# Patient Record
Sex: Male | Born: 1980
Health system: Southern US, Community
[De-identification: ages and names within clinical notes are randomized; demographics above are authoritative.]

## PROBLEM LIST (undated history)

## (undated) DIAGNOSIS — R569 Unspecified convulsions: Secondary | ICD-10-CM

## (undated) DIAGNOSIS — G43909 Migraine, unspecified, not intractable, without status migrainosus: Secondary | ICD-10-CM

## (undated) DIAGNOSIS — C8475 Anaplastic large cell lymphoma, ALK-negative, lymph nodes of inguinal region and lower limb: Secondary | ICD-10-CM

## (undated) MED FILL — Dexamethasone Sodium Phosphate Inj 100 MG/10ML: INTRAMUSCULAR | Qty: 1 | Status: AC

## (undated) MED FILL — Fosaprepitant Dimeglumine For IV Infusion 150 MG (Base Eq): INTRAVENOUS | Qty: 5 | Status: AC

---

## 2004-10-28 ENCOUNTER — Emergency Department (HOSPITAL_COMMUNITY): Admission: EM | Admit: 2004-10-28 | Discharge: 2004-10-28 | Payer: Self-pay | Admitting: Emergency Medicine

## 2005-07-28 ENCOUNTER — Emergency Department (HOSPITAL_COMMUNITY): Admission: EM | Admit: 2005-07-28 | Discharge: 2005-07-28 | Payer: Self-pay | Admitting: Emergency Medicine

## 2013-07-01 ENCOUNTER — Encounter (HOSPITAL_COMMUNITY): Payer: Self-pay | Admitting: Emergency Medicine

## 2013-07-01 ENCOUNTER — Emergency Department (HOSPITAL_COMMUNITY)
Admission: EM | Admit: 2013-07-01 | Discharge: 2013-07-01 | Disposition: A | Discharge status: Home / Self Care | Payer: Self-pay | Attending: Emergency Medicine | Admitting: Emergency Medicine

## 2013-07-01 DIAGNOSIS — L0291 Cutaneous abscess, unspecified: Secondary | ICD-10-CM

## 2013-07-01 DIAGNOSIS — L03317 Cellulitis of buttock: Secondary | ICD-10-CM | POA: Insufficient documentation

## 2013-07-01 DIAGNOSIS — L0231 Cutaneous abscess of buttock: Secondary | ICD-10-CM | POA: Insufficient documentation

## 2013-07-01 MED ORDER — SULFAMETHOXAZOLE-TRIMETHOPRIM 800-160 MG PO TABS: 1.0000 | ORAL_TABLET | Freq: Two times a day (BID) | ORAL | Status: DC

## 2013-07-01 MED ORDER — HYDROCODONE-ACETAMINOPHEN 5-325 MG PO TABS: 1.0000 | ORAL_TABLET | Freq: Four times a day (QID) | ORAL | Status: DC | PRN

## 2013-07-01 NOTE — ED Provider Notes (Signed)
History  This chart was scribed for Roxy Horseman - PA by Manuela Schwartz, ED scribe. This patient was seen in room TR11C/TR11C and the patient's care was started at 1838.  CSN: [id number] Arrival date & time 07/01/13  1819  First MD Initiated Contact with Patient 07/01/13 1838     Chief Complaint  Patient presents with  . Rash   The history is provided by the patient. No language interpreter was used.   HPI Comments: Randy [name] is a 32 y.o. male who presents to the Emergency Department complaining of constant, gradually worsening, painful abscess to his right buttocks, onset 4 days ago. He states tried using warm compress and pressure to drain it himself but denies any drainage from abscess. He denies fever/chills. He has not taken any medicines for this problem nor has he began any new medicines. He denise fever/chills. He denies any IV drug usage. He states no hx of prior abscess or boils.   He also c/o small localized itchy rash to his right wrist/forearm which has been constantly and gradually worsening over the past month. He states has not taken any medicines for this problem either and has not yet seen anyone for this problem.  History reviewed. No pertinent past medical history. History reviewed. No pertinent past surgical history. No family history on file. History  Substance Use Topics  . Smoking status: Not on file  . Smokeless tobacco: Not on file  . Alcohol Use: Not on file    Review of Systems  Constitutional: Negative for fever and chills.  HENT: Negative for congestion and rhinorrhea.   Respiratory: Negative for cough and shortness of breath.   Cardiovascular: Negative for chest pain.  Gastrointestinal: Negative for nausea, vomiting and abdominal pain.  Skin: Positive for rash (itchy rash over his right wrist).       Abscess over his right buttocks which is painful  Neurological: Negative for weakness.  All other systems reviewed and are negative.   A  complete 10 system review of systems was obtained and all systems are negative except as noted in the HPI and PMH.   Allergies  Review of patient's allergies indicates no known allergies.  Home Medications  No current outpatient prescriptions on file. Triage Vitals: BP 132/84  Pulse 94  Temp(Src) 98.5 F (36.9 C) (Oral)  Resp 16  SpO2 96% Physical Exam  Nursing note and vitals reviewed. Constitutional: He is oriented to person, place, and time. He appears well-developed and well-nourished.  HENT:  Head: Normocephalic and atraumatic.  Eyes: Conjunctivae and EOM are normal.  Neck: Normal range of motion.  Cardiovascular: Normal rate.   Pulmonary/Chest: Effort normal.  Abdominal: He exhibits no distension.  Musculoskeletal: Normal range of motion.  Neurological: He is alert and oriented to person, place, and time.  Skin: Skin is dry.     2 abscesses to the buttocks, with moderate induration and fluctuance as diagrammed  Psychiatric: He has a normal mood and affect. His behavior is normal. Judgment and thought content normal.    ED Course  Procedures (including critical care time) DIAGNOSTIC STUDIES: Oxygen Saturation is 96% on room air, normal by my interpretation.    COORDINATION OF CARE: At 720 PM Discussed treatment plan with patient which includes incision and drainage of abscesses to right buttocks. Patient agrees.   INCISION AND DRAINAGE Performed by: Roxy Horseman Consent: Verbal consent obtained. Risks and benefits: risks, benefits and alternatives were discussed Type: abscess  Body area: Buttock  Anesthesia:  local infiltration  Incision was made with a scalpel.  Local anesthetic: lidocaine 2 % with epinephrine  Anesthetic total: 5 ml  Complexity: complex Blunt dissection to break up loculations  Drainage: purulent  Drainage amount: Moderate   Packing material: Not packed   Patient tolerance: Patient tolerated the procedure well with no  immediate complications.     Labs Reviewed - No data to display No results found. 1. Abscess     MDM  Patient with abscesses. Abscesses have been drained. Will order wound culture. Will give antibiotics, pain medicine, and recommend followup with primary care. Patient is stable and ready for discharge.  I personally performed the services described in this documentation, which was scribed in my presence. The recorded information has been reviewed and is accurate.     Roxy Horseman, PA-C 07/01/13 1942

## 2013-07-01 NOTE — ED Notes (Signed)
Pt reports boils on buttox that started as rash on Thursday. They were itching at first and now very painful.

## 2013-07-02 NOTE — ED Provider Notes (Signed)
Medical screening examination/treatment/procedure(s) were performed by non-physician practitioner and as supervising physician I was immediately available for consultation/collaboration.  Acadia Thammavong, MD 07/02/13 0153 

## 2013-07-05 ENCOUNTER — Telehealth (HOSPITAL_COMMUNITY): Payer: Self-pay | Admitting: Emergency Medicine

## 2013-07-05 NOTE — ED Notes (Signed)
Post ED Visit - Positive Culture Follow-up  Culture report reviewed by antimicrobial stewardship pharmacist: [x]  Wes Dulaney, Pharm.D., BCPS []  Celedonio Miyamoto, Pharm.D., BCPS []  Georgina Pillion, Pharm.D., BCPS []  [place], [address].D., BCPS, AAHIVP []  Estella Husk, Pharm.D., BCPS, AAHIVP  Positive wound culture Treated with Sulfa-Trimeth, organism sensitive to the same and no further patient follow-up is required at this time.  Kylie A Holland 07/05/2013, 6:03 PM

## 2014-01-05 ENCOUNTER — Encounter (HOSPITAL_COMMUNITY): Payer: Self-pay | Admitting: Emergency Medicine

## 2014-01-05 ENCOUNTER — Inpatient Hospital Stay (HOSPITAL_COMMUNITY)
Admission: EM | Admit: 2014-01-05 | Discharge: 2014-01-08 | DRG: 076 | Disposition: A | Discharge status: Home / Self Care | Payer: Self-pay | Attending: Internal Medicine | Admitting: Internal Medicine

## 2014-01-05 DIAGNOSIS — A879 Viral meningitis, unspecified: Principal | ICD-10-CM | POA: Diagnosis present

## 2014-01-05 DIAGNOSIS — F17200 Nicotine dependence, unspecified, uncomplicated: Secondary | ICD-10-CM | POA: Diagnosis present

## 2014-01-05 DIAGNOSIS — A539 Syphilis, unspecified: Secondary | ICD-10-CM | POA: Diagnosis present

## 2014-01-05 DIAGNOSIS — B003 Herpesviral meningitis: Secondary | ICD-10-CM

## 2014-01-05 DIAGNOSIS — A6002 Herpesviral infection of other male genital organs: Secondary | ICD-10-CM

## 2014-01-05 DIAGNOSIS — E876 Hypokalemia: Secondary | ICD-10-CM | POA: Diagnosis present

## 2014-01-05 DIAGNOSIS — G039 Meningitis, unspecified: Secondary | ICD-10-CM | POA: Diagnosis present

## 2014-01-05 DIAGNOSIS — A530 Latent syphilis, unspecified as early or late: Secondary | ICD-10-CM

## 2014-01-05 DIAGNOSIS — R21 Rash and other nonspecific skin eruption: Secondary | ICD-10-CM | POA: Diagnosis present

## 2014-01-05 HISTORY — DX: Unspecified convulsions: R56.9

## 2014-01-05 HISTORY — DX: Migraine, unspecified, not intractable, without status migrainosus: G43.909

## 2014-01-05 LAB — CBC WITH DIFFERENTIAL/PLATELET
BASOS ABS: 0.1 10*3/uL (ref 0.0–0.1)
BASOS PCT: 0 % (ref 0–1)
EOS ABS: 0.3 10*3/uL (ref 0.0–0.7)
Eosinophils Relative: 2 % (ref 0–5)
HCT: 45.1 % (ref 39.0–52.0)
Hemoglobin: 16.2 g/dL (ref 13.0–17.0)
Lymphocytes Relative: 16 % (ref 12–46)
Lymphs Abs: 2.2 10*3/uL (ref 0.7–4.0)
MCH: 30.7 pg (ref 26.0–34.0)
MCHC: 35.9 g/dL (ref 30.0–36.0)
MCV: 85.4 fL (ref 78.0–100.0)
MONO ABS: 0.9 10*3/uL (ref 0.1–1.0)
Monocytes Relative: 6 % (ref 3–12)
NEUTROS ABS: 10.3 10*3/uL — AB (ref 1.7–7.7)
NEUTROS PCT: 75 % (ref 43–77)
Platelets: 199 10*3/uL (ref 150–400)
RBC: 5.28 MIL/uL (ref 4.22–5.81)
RDW: 16.3 % — AB (ref 11.5–15.5)
WBC: 13.7 10*3/uL — ABNORMAL HIGH (ref 4.0–10.5)

## 2014-01-05 LAB — BASIC METABOLIC PANEL
BUN: 8 mg/dL (ref 6–23)
CHLORIDE: 100 meq/L (ref 96–112)
CO2: 23 mEq/L (ref 19–32)
CREATININE: 1.33 mg/dL (ref 0.50–1.35)
Calcium: 9.6 mg/dL (ref 8.4–10.5)
GFR, EST AFRICAN AMERICAN: 82 mL/min — AB (ref >90)
GFR, EST NON AFRICAN AMERICAN: 71 mL/min — AB (ref >90)
Glucose, Bld: 102 mg/dL — ABNORMAL HIGH (ref 70–99)
POTASSIUM: 3.5 meq/L — AB (ref 3.7–5.3)
Sodium: 139 mEq/L (ref 137–147)

## 2014-01-05 NOTE — ED Notes (Signed)
Presents with headache on right side of head and behind right eye began this am associated with light and sound sensitivity and nausea,  Also c/o back and neck pain began the same time as the headache.  Rash inbetween fingers on hands and on legs, and lump to on left groin. Reports chills. Afebrile at triage.  Alert, oriented. Headache gradually became worse.

## 2014-01-06 ENCOUNTER — Inpatient Hospital Stay (HOSPITAL_COMMUNITY): Payer: Self-pay

## 2014-01-06 ENCOUNTER — Emergency Department (HOSPITAL_COMMUNITY): Payer: Self-pay

## 2014-01-06 DIAGNOSIS — R291 Meningismus: Secondary | ICD-10-CM

## 2014-01-06 DIAGNOSIS — R839 Unspecified abnormal finding in cerebrospinal fluid: Secondary | ICD-10-CM

## 2014-01-06 DIAGNOSIS — R51 Headache: Secondary | ICD-10-CM

## 2014-01-06 DIAGNOSIS — R112 Nausea with vomiting, unspecified: Secondary | ICD-10-CM

## 2014-01-06 DIAGNOSIS — R21 Rash and other nonspecific skin eruption: Secondary | ICD-10-CM

## 2014-01-06 DIAGNOSIS — G039 Meningitis, unspecified: Secondary | ICD-10-CM

## 2014-01-06 DIAGNOSIS — B009 Herpesviral infection, unspecified: Secondary | ICD-10-CM

## 2014-01-06 LAB — CSF CELL COUNT WITH DIFFERENTIAL
LYMPHS CSF: 86 % — AB (ref 40–80)
Lymphs, CSF: 88 % — ABNORMAL HIGH (ref 40–80)
MONOCYTE-MACROPHAGE-SPINAL FLUID: 9 % — AB (ref 15–45)
Monocyte-Macrophage-Spinal Fluid: 13 % — ABNORMAL LOW (ref 15–45)
RBC Count, CSF: 8 /mm3 — ABNORMAL HIGH
RBC Count, CSF: 9 /mm3 — ABNORMAL HIGH
SEGMENTED NEUTROPHILS-CSF: 1 % (ref 0–6)
Segmented Neutrophils-CSF: 3 % (ref 0–6)
TUBE #: 1
TUBE #: 4
WBC CSF: 439 /mm3 — AB (ref 0–5)
WBC, CSF: 322 /mm3 (ref 0–5)

## 2014-01-06 LAB — HIV ANTIBODY (ROUTINE TESTING W REFLEX): HIV: NONREACTIVE

## 2014-01-06 LAB — RPR: RPR Ser Ql: REACTIVE — AB

## 2014-01-06 LAB — GRAM STAIN

## 2014-01-06 LAB — HERPES SIMPLEX VIRUS(HSV) DNA BY PCR
HSV 1 DNA: NOT DETECTED
HSV 2 DNA: DETECTED

## 2014-01-06 LAB — CRYPTOCOCCAL ANTIGEN, CSF: Crypto Ag: NEGATIVE

## 2014-01-06 LAB — PROTEIN, CSF: TOTAL PROTEIN, CSF: 132 mg/dL — AB (ref 15–45)

## 2014-01-06 LAB — RPR TITER: RPR Titer: 1:4 {titer} — AB

## 2014-01-06 LAB — GLUCOSE, CSF: Glucose, CSF: 57 mg/dL (ref 43–76)

## 2014-01-06 MED ORDER — ONDANSETRON HCL 4 MG/2ML IJ SOLN
4.0000 mg | Freq: Once | INTRAMUSCULAR | Status: AC
  Administered 2014-01-06: 4 mg via INTRAVENOUS
  Filled 2014-01-06: qty 2

## 2014-01-06 MED ORDER — DEXTROSE 5 % IV SOLN: 2.0000 g | INTRAVENOUS | Status: DC

## 2014-01-06 MED ORDER — DEXTROSE 5 % IV SOLN
2.0000 g | Freq: Once | INTRAVENOUS | Status: AC
  Administered 2014-01-06: 2 g via INTRAVENOUS
  Filled 2014-01-06: qty 2

## 2014-01-06 MED ORDER — ACETAMINOPHEN 650 MG RE SUPP: 650.0000 mg | Freq: Four times a day (QID) | RECTAL | Status: DC | PRN

## 2014-01-06 MED ORDER — ENOXAPARIN SODIUM 40 MG/0.4ML ~~LOC~~ SOLN
40.0000 mg | SUBCUTANEOUS | Status: DC
  Administered 2014-01-06 – 2014-01-07 (×2): 40 mg via SUBCUTANEOUS
  Filled 2014-01-06 (×3): qty 0.4

## 2014-01-06 MED ORDER — SODIUM CHLORIDE 0.9 % IV SOLN
2.0000 g | INTRAVENOUS | Status: DC
  Administered 2014-01-06: 2 g via INTRAVENOUS
  Filled 2014-01-06 (×3): qty 2000

## 2014-01-06 MED ORDER — DEXTROSE 5 % IV SOLN
2.0000 g | Freq: Two times a day (BID) | INTRAVENOUS | Status: DC
  Administered 2014-01-06 – 2014-01-07 (×2): 2 g via INTRAVENOUS
  Filled 2014-01-06 (×3): qty 2

## 2014-01-06 MED ORDER — HYDROMORPHONE HCL PF 1 MG/ML IJ SOLN
1.0000 mg | Freq: Once | INTRAMUSCULAR | Status: AC
  Administered 2014-01-06: 1 mg via INTRAVENOUS
  Filled 2014-01-06: qty 1

## 2014-01-06 MED ORDER — DEXTROSE 5 % IV SOLN
800.0000 mg | Freq: Three times a day (TID) | INTRAVENOUS | Status: DC
  Administered 2014-01-06 – 2014-01-07 (×3): 800 mg via INTRAVENOUS
  Filled 2014-01-06 (×5): qty 16

## 2014-01-06 MED ORDER — MAGNESIUM SULFATE 40 MG/ML IJ SOLN
2.0000 g | Freq: Once | INTRAMUSCULAR | Status: AC
  Administered 2014-01-06: 2 g via INTRAVENOUS
  Filled 2014-01-06: qty 50

## 2014-01-06 MED ORDER — HYDROCODONE-ACETAMINOPHEN 5-325 MG PO TABS
1.0000 | ORAL_TABLET | ORAL | Status: DC | PRN
  Administered 2014-01-06 – 2014-01-08 (×6): 2 via ORAL
  Filled 2014-01-06 (×6): qty 2

## 2014-01-06 MED ORDER — METOCLOPRAMIDE HCL 5 MG/ML IJ SOLN
10.0000 mg | Freq: Once | INTRAMUSCULAR | Status: AC
  Administered 2014-01-06: 10 mg via INTRAVENOUS
  Filled 2014-01-06: qty 2

## 2014-01-06 MED ORDER — ONDANSETRON HCL 4 MG/2ML IJ SOLN: 4.0000 mg | Freq: Four times a day (QID) | INTRAMUSCULAR | Status: DC | PRN

## 2014-01-06 MED ORDER — VANCOMYCIN HCL IN DEXTROSE 1-5 GM/200ML-% IV SOLN
1000.0000 mg | Freq: Three times a day (TID) | INTRAVENOUS | Status: DC
  Administered 2014-01-06 – 2014-01-07 (×2): 1000 mg via INTRAVENOUS
  Filled 2014-01-06 (×4): qty 200

## 2014-01-06 MED ORDER — ACETAMINOPHEN 325 MG PO TABS
650.0000 mg | ORAL_TABLET | Freq: Four times a day (QID) | ORAL | Status: DC | PRN
  Administered 2014-01-07: 650 mg via ORAL
  Filled 2014-01-06: qty 2

## 2014-01-06 MED ORDER — SODIUM CHLORIDE 0.9 % IV SOLN
INTRAVENOUS | Status: DC
  Administered 2014-01-06 – 2014-01-07 (×2): via INTRAVENOUS

## 2014-01-06 MED ORDER — SODIUM CHLORIDE 0.9 % IV BOLUS (SEPSIS)
1000.0000 mL | Freq: Once | INTRAVENOUS | Status: AC
  Administered 2014-01-06: 1000 mL via INTRAVENOUS

## 2014-01-06 MED ORDER — DEXAMETHASONE SODIUM PHOSPHATE 4 MG/ML IJ SOLN
10.0000 mg | Freq: Once | INTRAMUSCULAR | Status: AC
  Administered 2014-01-06: 10 mg via INTRAVENOUS
  Filled 2014-01-06: qty 3

## 2014-01-06 MED ORDER — VANCOMYCIN HCL IN DEXTROSE 1-5 GM/200ML-% IV SOLN
1000.0000 mg | Freq: Once | INTRAVENOUS | Status: AC
  Administered 2014-01-06: 1000 mg via INTRAVENOUS
  Filled 2014-01-06: qty 200

## 2014-01-06 MED ORDER — DIPHENHYDRAMINE HCL 50 MG/ML IJ SOLN
25.0000 mg | Freq: Once | INTRAMUSCULAR | Status: AC
  Administered 2014-01-06: 25 mg via INTRAVENOUS
  Filled 2014-01-06: qty 1

## 2014-01-06 MED ORDER — ONDANSETRON HCL 4 MG PO TABS: 4.0000 mg | ORAL_TABLET | Freq: Four times a day (QID) | ORAL | Status: DC | PRN

## 2014-01-06 MED ORDER — SODIUM CHLORIDE 0.9 % IV SOLN
1.0000 g | Freq: Four times a day (QID) | INTRAVENOUS | Status: DC
  Filled 2014-01-06 (×2): qty 1000

## 2014-01-06 NOTE — ED Provider Notes (Signed)
CSN: [id number]     Arrival date & time 01/05/14  2103 History   First MD Initiated Contact with Patient 01/06/14 0121     Chief Complaint  Patient presents with  . Headache   (Consider location/radiation/quality/duration/timing/severity/associated sxs/prior Treatment) Patient is a 33 y.o. male presenting with headaches. The history is provided by the patient.  Headache Pain location:  R temporal Quality:  Stabbing and sharp Radiates to:  R neck Severity currently:  10/10 Severity at highest:  10/10 Onset quality:  Gradual Duration:  12 hours Timing:  Constant Progression:  Worsening Chronicity:  New Similar to prior headaches: yes   Context: bright light   Relieved by:  Nothing Worsened by:  Light and neck movement Ineffective treatments:  Aspirin Associated symptoms: fever, neck pain, photophobia and vomiting   Associated symptoms: no abdominal pain, no back pain, no neck stiffness, no seizures and no weakness    Feels similar to previous migraines but today has associated fever and neck pain.  Past Medical History  Diagnosis Date  . Migraines   . Seizures    History reviewed. No pertinent past surgical history. History reviewed. No pertinent family history. History  Substance Use Topics  . Smoking status: Current Every Day Smoker    Types: Cigarettes  . Smokeless tobacco: Not on file  . Alcohol Use: Yes    Review of Systems  Constitutional: Positive for fever. Negative for chills.  Eyes: Positive for photophobia. Negative for visual disturbance.  Respiratory: Negative for shortness of breath.   Cardiovascular: Negative for chest pain.  Gastrointestinal: Positive for vomiting. Negative for abdominal pain.  Genitourinary: Negative for dysuria.  Musculoskeletal: Positive for neck pain. Negative for back pain and neck stiffness.  Skin: Negative for rash.  Neurological: Positive for headaches. Negative for seizures and weakness.  All other systems reviewed and are  negative.    Allergies  Review of patient's allergies indicates no known allergies.  Home Medications   Current Outpatient Rx  Name  Route  Sig  Dispense  Refill  . Aspirin-Salicylamide-Caffeine (BC HEADACHE POWDER PO)   Oral   Take 1 packet by mouth daily as needed (headache).          BP 128/59  Pulse 97  Temp(Src) 98.3 F (36.8 C) (Oral)  Resp 20  Wt 185 lb (83.915 kg)  SpO2 94% Physical Exam  Constitutional: He is oriented to person, place, and time. He appears well-developed and well-nourished.  HENT:  Head: Normocephalic and atraumatic.  Mouth/Throat: Oropharynx is clear and moist.  Eyes: EOM are normal. Pupils are equal, round, and reactive to light.  Neck: Neck supple.  Cardiovascular: Regular rhythm and intact distal pulses.   Tachycardic  Pulmonary/Chest: Effort normal and breath sounds normal. No stridor. No respiratory distress.  Abdominal: Soft. He exhibits no distension.  Musculoskeletal: Normal range of motion. He exhibits no edema.  Lymphadenopathy:    He has no cervical adenopathy.  Neurological: He is alert and oriented to person, place, and time. No cranial nerve deficit. Coordination normal.  Skin: Skin is warm and dry.    ED Course  LUMBAR PUNCTURE Date/Time: 01/06/2014 4:26 AM Performed by: Sunnie Nielsen Authorized by: Sunnie Nielsen Consent: Verbal consent obtained. Risks and benefits: risks, benefits and alternatives were discussed Consent given by: patient Patient understanding: patient states understanding of the procedure being performed Patient consent: the patient's understanding of the procedure matches consent given Procedure consent: procedure consent matches procedure scheduled Required items: required blood products, implants, devices,  and special equipment available Patient identity confirmed: verbally with patient Time out: Immediately prior to procedure a "time out" was called to verify the correct patient, procedure, equipment,  support staff and site/side marked as required. Indications: evaluation for infection Anesthesia: local infiltration Local anesthetic: lidocaine 1% without epinephrine Anesthetic total: 2 ml Preparation: Patient was prepped and draped in the usual sterile fashion. Lumbar space: L4-L5 interspace Patient's position: sitting Needle gauge: 20 Needle type: spinal needle - Quincke tip Needle length: 2.5 in Number of attempts: 1 Fluid appearance: clear Tubes of fluid: 4 Total volume: 4 ml Post-procedure: site cleaned and adhesive bandage applied Patient tolerance: Patient tolerated the procedure well with no immediate complications.   (including critical care time) Labs Review Labs Reviewed  CBC WITH DIFFERENTIAL - Abnormal; Notable for the following:    WBC 13.7 (*)    RDW 16.3 (*)    Neutro Abs 10.3 (*)    All other components within normal limits  BASIC METABOLIC PANEL - Abnormal; Notable for the following:    Potassium 3.5 (*)    Glucose, Bld 102 (*)    GFR calc non Af Amer 71 (*)    GFR calc Af Amer 82 (*)    All other components within normal limits  CSF CULTURE  GRAM STAIN  CSF CELL COUNT WITH DIFFERENTIAL  CSF CELL COUNT WITH DIFFERENTIAL  GLUCOSE, CSF  PROTEIN, CSF   Imaging Review Ct Head Wo Contrast  01/06/2014   CLINICAL DATA:  Headache, photophobia.  EXAM: CT HEAD WITHOUT CONTRAST  TECHNIQUE: Contiguous axial images were obtained from the base of the skull through the vertex without intravenous contrast.  COMPARISON:  None available for comparison at time of study interpretation.  FINDINGS: Motion degraded evaluation of the skullbase. The ventricles and sulci are normal. No intraparenchymal hemorrhage, mass effect nor midline shift. No acute large vascular territory infarcts.  No abnormal extra-axial fluid collections. Basal cisterns are patent.  No skull fracture. Visualized paranasal sinuses and mastoid air-cells are well-aerated. The included ocular globes and  orbital contents are non-suspicious.  IMPRESSION: No acute intracranial process: Normal noncontrast CT of the head.   Electronically Signed   By: Awilda Metroourtnay  Bloomer   On: 01/06/2014 04:00    IV fluids and headache cocktail provided. On recheck remains symptomatic and tachycardic. Patient given IV magnesium and sent to CT scan and consented for lumbar puncture. Droplet precautions initiated.  Lumbar puncture performed and IV antibiotics initiated. IV Dilaudid Zofran and pain control. Medicine consult for admission.   MDM  Diagnosis: Meningitis  Labs and imaging obtained and reviewed as above. Lumbar puncture performed. IV fluids and medications. CSF with elevated white blood cells/ cultures pending Med admit   Sunnie NielsenBrian Katrina Brosh, MD 01/07/14 [phone]

## 2014-01-06 NOTE — ED Notes (Signed)
Pt changed to gown and states neck pain better and headache 7/10

## 2014-01-06 NOTE — ED Notes (Signed)
Admit Doctor at bedside.  

## 2014-01-06 NOTE — Progress Notes (Signed)
ANTIBIOTIC CONSULT NOTE - INITIAL  Pharmacy Consult for Acyclovir, Vancomycin Indication: r/o Menningitis  No Known Allergies  Patient Measurements: Height: 6\' 1"  (185.4 cm) Weight: 180 lb (81.647 kg) IBW/kg (Calculated) : 79.9  Vital Signs: Temp: 98.5 F (36.9 C) (01/13 0909) Temp src: Oral (01/13 0909) BP: 121/69 mmHg (01/13 0909) Pulse Rate: 60 (01/13 0909) Intake/Output from previous day:  Labs:  Recent Labs  01/05/14 2135  WBC 13.7*  HGB 16.2  PLT 199  CREATININE 1.33   Estimated Creatinine Clearance: 91.8 ml/min (by C-G formula based on Cr of 1.33).  Microbiology: Recent Results (from the past 720 hour(s))  CSF CULTURE     Status: None   Collection Time    01/06/14  4:23 AM      Result Value Range Status   Specimen Description CSF   Final   Special Requests TUBE 2, 2.5CC   Final   Gram Stain     Final   Value: CYTOSPIN SLIDE WBC PRESENT,BOTH PMN AND MONONUCLEAR     NO ORGANISMS SEEN     Performed at [facility]     Performed at [facility]   Culture PENDING   Incomplete   Report Status PENDING   Incomplete  GRAM STAIN     Status: None   Collection Time    01/06/14  4:23 AM      Result Value Range Status   Specimen Description CSF   Final   Special Requests TUBE 2, 2.5CC   Final   Gram Stain     Final   Value: CYTOSPIN SLIDE     WBC PRESENT,BOTH PMN AND MONONUCLEAR     NO ORGANISMS SEEN   Report Status 01/06/2014 FINAL   Final    Medical History: Past Medical History  Diagnosis Date  . Migraines   . Seizures    Medications:  Anti-infectives   Start     Dose/Rate Route Frequency Ordered Stop   01/07/14 0800  cefTRIAXone (ROCEPHIN) 2 g in dextrose 5 % 50 mL IVPB  Status:  Discontinued     2 g 100 mL/hr over 30 Minutes Intravenous Every 24 hours 01/06/14 0921 01/06/14 1030   01/06/14 2000  cefTRIAXone (ROCEPHIN) 2 g in dextrose 5 % 50 mL IVPB     2 g 100 mL/hr over 30 Minutes Intravenous Every 12 hours 01/06/14 1030     01/06/14 1200  ampicillin (OMNIPEN) 1 g in sodium chloride 0.9 % 50 mL IVPB  Status:  Discontinued     1 g 150 mL/hr over 20 Minutes Intravenous 4 times per day 01/06/14 0921 01/06/14 1029   01/06/14 1200  ampicillin (OMNIPEN) 2 g in sodium chloride 0.9 % 50 mL IVPB     2 g 150 mL/hr over 20 Minutes Intravenous 6 times per day 01/06/14 1029     01/06/14 1100  acyclovir (ZOVIRAX) 800 mg in dextrose 5 % 150 mL IVPB     800 mg 166 mL/hr over 60 Minutes Intravenous 3 times per day 01/06/14 1044     01/06/14 0545  vancomycin (VANCOCIN) IVPB 1000 mg/200 mL premix     1,000 mg 200 mL/hr over 60 Minutes Intravenous  Once 01/06/14 0543 01/06/14 0735   01/06/14 0545  cefTRIAXone (ROCEPHIN) 2 g in dextrose 5 % 50 mL IVPB     2 g 100 mL/hr over 30 Minutes Intravenous  Once 01/06/14 0543 01/06/14 0854     Assessment: 33 yo male admitted with complaints of  worsening headache with neck pain and stiffness as well as a rash to hand and foot.  He had an LP with reveals elevated WBC and we have been asked to dose antibiotics for renal function including Vancomycin and Acyclovir.  He was given a dose of 2gm Ceftriaxone at ~ 07:30AM and a dose of IV Vancomycin 1gm ~ 07:30AM as well.  I have changed his maintenance doses of Ampicillin and Ceftriaxone to meningitis doses to ensure CSF penetration.  He has a slightly elevated creatinine of 1.33 with an estimated crcl of  ~ 5690ml/min.  Goal of Therapy:  Vancomycin trough level 15-20 mcg/ml Therapeutic response to antibiotics and antiviral therapy  Plan:  1.  Increase Ampicillin to 2gm IV every 4 hours 2.  Increase Ceftriaxone to 2gm IV every 12 hours 3.  Start IV Acyclovir 800mg  (10mg /kg) every 8 hours 4.  Continue IV Vancomycin 1gm but make it every 8 hours  5.  Monitor renal function, urine output and culture data as well as potential adverse events.  Nadara MustardNita Iliani Vejar, PharmD., MS Clinical Pharmacist Pager:  [phone] Thank you for allowing pharmacy to be  part of this patients care team. 01/06/2014,10:46 AM

## 2014-01-06 NOTE — Progress Notes (Signed)
Utilization review completed.  

## 2014-01-06 NOTE — ED Notes (Signed)
Called and left message for Company secretarylow Manager.

## 2014-01-06 NOTE — ED Notes (Signed)
Patient denies pain and is resting comfortably.  

## 2014-01-06 NOTE — Consult Note (Signed)
[facility]  Total days of antibiotics 1        Day 1 ceftriaxone        Day 1 acyclovir        Day 1 vanco        Day 1 amp       Reason for Consult: meningitis    Referring Physician: viyuoh  Active Problems:   Meningitis   Rash and nonspecific skin eruption    HPI: Randy [name] is a 33 y.o. male Randy [name] with remote history of migraines adn childhood seizure not taking medications pressently who was admitted overnight for worsening headachex1day. He states the headache -mostly right sided and behind his right eye. He admits to associated photophobia and nausea but denies phonophobia. Patient also reports that he's had a neck pain and stiffness of for the same duration. he denies fevers. . He was seen in the ED and a CT of his head showed no acute intracranial process. His wbc elevated at 13.7 but afebrile. LP was done and tube #4cell count showed WBC 439, 88% lymphocytes, protein 132, glucose 57. Gram stain shows no organism. He was given a dose of Decadron 10 mg in the ED and started on ceftriaxone, vancomycin, acyclovir and ampicillin and is admitted for further evaluation and management. Cryptococcal antigen in CS is negative. Vdrl, rpr, hiv pending, as well as csf culture. He is now 36hr from onset of symptoms, and reports being symptom free. Headache resolved with pain medication. No n/v/photophobia   Past Medical History  Diagnosis Date  . Migraines   . Seizures     Allergies: No Known Allergies  MEDICATIONS: . acyclovir  800 mg Intravenous Q8H  . ampicillin (OMNIPEN) IV  2 g Intravenous Q4H  . cefTRIAXone (ROCEPHIN)  IV  2 g Intravenous Q12H  . enoxaparin (LOVENOX) injection  40 mg Subcutaneous Q24H    History  Substance Use Topics  . Smoking status: Current Every Day Smoker    Types: Cigarettes  . Smokeless tobacco: Not on file  . Alcohol Use: Yes    History reviewed. No pertinent family history.  Review of Systems    Constitutional: Positive for fever. Negative for chills.  Eyes: Positive for photophobia. Negative for visual disturbance.  Respiratory: Negative for shortness of breath.  Cardiovascular: Negative for chest pain.  Gastrointestinal: Positive for vomiting. Negative for abdominal pain.  Genitourinary: Negative for dysuria.  Musculoskeletal: Positive for neck pain. Negative for back pain and neck stiffness.  Skin: Negative for rash.  Neurological: Positive for headaches. Negative for seizures and weakness.  All other systems reviewed and are negative.   OBJECTIVE: Temp:  [98.2 F (36.8 C)-99.5 F (37.5 C)] 98.5 F (36.9 C) (01/13 0909) Pulse Rate:  [60-108] 60 (01/13 0909) Resp:  [16-22] 18 (01/13 0909) BP: (114-148)/(59-95) 121/69 mmHg (01/13 0909) SpO2:  [94 %-100 %] 100 % (01/13 0909) Weight:  [180 lb (81.647 kg)-185 lb (83.915 kg)] 180 lb (81.647 kg) (01/13 0909)  Constitutional: He is oriented to person, place, and time. He appears well-developed and well-nourished. No distress.  HENT:  Mouth/Throat: Oropharynx is clear and moist. No oropharyngeal exudate.  Cardiovascular: Normal rate, regular rhythm and normal heart sounds. Exam reveals no gallop and no friction rub.  No murmur heard.  Pulmonary/Chest: Effort normal and breath sounds normal. No respiratory distress. He has no wheezes.  Abdominal: Soft. Bowel sounds are normal. He exhibits no distension. There is no tenderness.  Lymphadenopathy:  He has no cervical adenopathy.  Neurological: He is alert and oriented to person, place, and time.  Skin: Skin is warm and dry. No rash noted. No erythema.  Psychiatric: He has a normal mood and affect. His behavior is normal.    LABS: Results for orders placed during the hospital encounter of 01/05/14 (from the past 48 hour(s))  CBC WITH DIFFERENTIAL     Status: Abnormal   Collection Time    01/05/14  9:35 PM      Result Value Range   WBC 13.7 (*) 4.0 - 10.5 K/uL   RBC 5.28   4.22 - 5.81 MIL/uL   Hemoglobin 16.2  13.0 - 17.0 g/dL   HCT 45.1  39.0 - 52.0 %   MCV 85.4  78.0 - 100.0 fL   MCH 30.7  26.0 - 34.0 pg   MCHC 35.9  30.0 - 36.0 g/dL   RDW 16.3 (*) 11.5 - 15.5 %   Platelets 199  150 - 400 K/uL   Neutrophils Relative % 75  43 - 77 %   Neutro Abs 10.3 (*) 1.7 - 7.7 K/uL   Lymphocytes Relative 16  12 - 46 %   Lymphs Abs 2.2  0.7 - 4.0 K/uL   Monocytes Relative 6  3 - 12 %   Monocytes Absolute 0.9  0.1 - 1.0 K/uL   Eosinophils Relative 2  0 - 5 %   Eosinophils Absolute 0.3  0.0 - 0.7 K/uL   Basophils Relative 0  0 - 1 %   Basophils Absolute 0.1  0.0 - 0.1 K/uL  BASIC METABOLIC PANEL     Status: Abnormal   Collection Time    01/05/14  9:35 PM      Result Value Range   Sodium 139  137 - 147 mEq/L   Potassium 3.5 (*) 3.7 - 5.3 mEq/L   Chloride 100  96 - 112 mEq/L   CO2 23  19 - 32 mEq/L   Glucose, Bld 102 (*) 70 - 99 mg/dL   BUN 8  6 - 23 mg/dL   Creatinine, Ser 1.33  0.50 - 1.35 mg/dL   Calcium 9.6  8.4 - 10.5 mg/dL   GFR calc non Af Amer 71 (*) >90 mL/min   GFR calc Af Amer 82 (*) >90 mL/min   Comment: (NOTE)     The eGFR has been calculated using the CKD EPI equation.     This calculation has not been validated in all clinical situations.     eGFR's persistently <90 mL/min signify possible Chronic Kidney     Disease.  CSF CELL COUNT WITH DIFFERENTIAL     Status: Abnormal   Collection Time    01/06/14  4:23 AM      Result Value Range   Tube # 1     Color, CSF COLORLESS  COLORLESS   Appearance, CSF CLEAR  CLEAR   Supernatant NOT INDICATED     RBC Count, CSF 9 (*) 0 /cu mm   WBC, CSF 322 (*) 0 - 5 /cu mm   Comment: CRITICAL RESULT CALLED TO, READ BACK BY AND VERIFIED WITH:     Elmyra Ricks RN [phone] GREEN R   Segmented Neutrophils-CSF 1  0 - 6 %   Lymphs, CSF 86 (*) 40 - 80 %   Monocyte-Macrophage-Spinal Fluid 13 (*) 15 - 45 %  CSF CELL COUNT WITH DIFFERENTIAL     Status: Abnormal   Collection Time    01/06/14  4:23 AM      Result  Value Range   Tube # 4     Color, CSF COLORLESS  COLORLESS   Appearance, CSF CLEAR  CLEAR   Supernatant NOT INDICATED     RBC Count, CSF 8 (*) 0 /cu mm   WBC, CSF 439 (*) 0 - 5 /cu mm   Comment: CRITICAL RESULT CALLED TO, READ BACK BY AND VERIFIED WITH:     Elmyra Ricks RN [phone] GREEN R   Segmented Neutrophils-CSF 3  0 - 6 %   Lymphs, CSF 88 (*) 40 - 80 %   Monocyte-Macrophage-Spinal Fluid 9 (*) 15 - 45 %  CSF CULTURE     Status: None   Collection Time    01/06/14  4:23 AM      Result Value Range   Specimen Description CSF     Special Requests TUBE 2, 2.5CC     Gram Stain       Value: CYTOSPIN SLIDE WBC PRESENT,BOTH PMN AND MONONUCLEAR     NO ORGANISMS SEEN     Performed at [facility]     Performed at [facility]   Culture PENDING     Report Status PENDING    GRAM STAIN     Status: None   Collection Time    01/06/14  4:23 AM      Result Value Range   Specimen Description CSF     Special Requests TUBE 2, 2.5CC     Gram Stain       Value: CYTOSPIN SLIDE     WBC PRESENT,BOTH PMN AND MONONUCLEAR     NO ORGANISMS SEEN   Report Status 01/06/2014 FINAL    GLUCOSE, CSF     Status: None   Collection Time    01/06/14  4:23 AM      Result Value Range   Glucose, CSF 57  43 - 76 mg/dL  PROTEIN, CSF     Status: Abnormal   Collection Time    01/06/14  4:23 AM      Result Value Range   Total  Protein, CSF 132 (*) 15 - 45 mg/dL  CRYPTOCOCCAL ANTIGEN, CSF     Status: None   Collection Time    01/06/14  4:23 AM      Result Value Range   Crypto Ag NEGATIVE  NEGATIVE   Cryptococcal Ag Titer NOT INDICATED  NOT INDICATED    MICRO: 1/13 csf cx pending 1/13 blood cx pending IMAGING: Dg Chest 2 View  01/06/2014   CLINICAL DATA:  Cough, fever, headache.  EXAM: CHEST  2 VIEW  COMPARISON:  None.  FINDINGS: Normal heart size and mediastinal contours. No acute infiltrate or edema. No effusion or pneumothorax. No acute osseous findings.  IMPRESSION: No active  cardiopulmonary disease.   Electronically Signed   By: Jorje Guild M.D.   On: 01/06/2014 06:47   Ct Head Wo Contrast  01/06/2014   CLINICAL DATA:  Headache, photophobia.  EXAM: CT HEAD WITHOUT CONTRAST  TECHNIQUE: Contiguous axial images were obtained from the base of the skull through the vertex without intravenous contrast.  COMPARISON:  None available for comparison at time of study interpretation.  FINDINGS: Motion degraded evaluation of the skullbase. The ventricles and sulci are normal. No intraparenchymal hemorrhage, mass effect nor midline shift. No acute large vascular territory infarcts.  No abnormal extra-axial fluid collections. Basal cisterns are patent.  No skull fracture. Visualized paranasal sinuses and mastoid air-cells  are well-aerated. The included ocular globes and orbital contents are non-suspicious.  IMPRESSION: No acute intracranial process: Normal noncontrast CT of the head.   Electronically Signed   By: Elon Alas   On: 01/06/2014 04:00    HISTORICAL MICRO/IMAGING 7/8 mssa wound infection s/p I x D  Assessment/Plan:  33 yo M with presentation of ha, nuchal rigidity with N/v, found to have lymphocytic predominance pleocytosis on CSF started on antibiotics for presumed bacterial meningitis and acyclovir for HSV meningitis. Favor aseptic/viral meningitis  - continue on vanco, ceftriaxone ,and acyclovir. Will discontinue steroids and ampicillin. Will discontinue antibiotics are results return from csf culture - can discontinue droplet precautions. Not consistent with meningococcal meningitis  Dorice Stiggers B. [facility] [phone]

## 2014-01-06 NOTE — ED Notes (Signed)
Notified Dr.

## 2014-01-06 NOTE — ED Notes (Signed)
[facility] stated per protocol Droplet precaution for patient.

## 2014-01-06 NOTE — ED Notes (Signed)
Notified Dr. Dierdre Highmanpitz of the WBC tube 1- 322, and tube 4 WBC- 439

## 2014-01-06 NOTE — ED Notes (Signed)
Staff who had contact:  Nani SkillernMandie Morris EMT,  CellarJohn Muncy  RN

## 2014-01-06 NOTE — ED Notes (Signed)
Paused antibiotics so lab could come and draw blood cultures now

## 2014-01-06 NOTE — ED Notes (Signed)
Assisted Dr. Theodoro Kalataptiz with LP by helping PT hold position.

## 2014-01-06 NOTE — ED Notes (Signed)
Blood cultures drawn and restarted Vancomycin

## 2014-01-06 NOTE — ED Notes (Signed)
Pt getting set up for LP and consented.  Pt ambulated to restroom and continues to have headache

## 2014-01-06 NOTE — H&P (Signed)
[facility] History and Physical  Randy [name] [name] DOB: 05-25-1981 DOA: 01/05/2014  Referring physician: EDP PCP: No PCP Per Patient   Chief Complaint: Persistent Headache  HPI: Randy [name] is a 33 y.o. male who presents with complaints of worsening headachex1day. He states the headache -mostly right hemispheric and behind his right eye. He admits to associated photophobia and nausea but denies phonophobia. Patient also reports that he's had a neck pain and stiffness of for the same duration. he denies fevers. He reports that he's had a rash on his left hand and right foot for some time, that he had it in the past and was prescribed 'some medication'and it resolved but came back again. He was seen in the ED and a CT of his head showed no acute intracranial process. LP was done and tube #4cell count showed WBC 439, 88% lymphocytes, protein 132, glucose 57. He was given a dose of Decadron 10 mg in the ED and started on high-dose Rocephin, as well as vancomycin and is admitted for further evaluation and management.    Review of Systems The patient denies anorexia, fever, weight loss,, vision loss, decreased hearing, hoarseness, chest pain, syncope, dyspnea on exertion, peripheral edema, balance deficits, hemoptysis, abdominal pain, melena, hematochezia, severe indigestion/heartburn, hematuria, incontinence, muscle weakness, transient blindness, difficulty walking, depression, unusual weight change, abnormal bleeding, enlarged lymph nodes,   Past Medical History  Diagnosis Date  . Migraines   . Seizures    History reviewed. No pertinent past surgical history. Social History:  reports that he has been smoking Cigarettes.  He has been smoking about 0.00 packs per day. He does not have any smokeless tobacco history on file. He reports that he drinks alcohol. He reports that he does not use illicit drugs.  No Known Allergies  History reviewed. No pertinent family  history.   Prior to Admission medications   Medication Sig Start Date End Date Taking? Authorizing Provider  Aspirin-Salicylamide-Caffeine (BC HEADACHE POWDER PO) Take 1 packet by mouth daily as needed (headache).   Yes Historical Provider, MD   Physical Exam: Filed Vitals:   01/06/14 0909  BP: 121/69  Pulse: 60  Temp: 98.5 F (36.9 C)  Resp: 18    BP 121/69  Pulse 60  Temp(Src) 98.5 F (36.9 C) (Oral)  Resp 18  Wt 83.915 kg (185 lb)  SpO2 100% Constitutional: Vital signs reviewed.  Patient is a well-developed and well-nourished in no acute distress and cooperative with exam. Alert and oriented x3.  Head: Normocephalic and atraumatic Mouth: no erythema or exudates, MMM Eyes: PERRL, EOMI, conjunctivae normal, No scleral icterus.  Neck: Nuchal rigidity present, Trachea midline normal ROM, No JVD, mass, thyromegaly, or carotid bruit present.  Cardiovascular: RRR, S1 normal, S2 normal, no MRG, pulses symmetric and intact bilaterally Pulmonary/Chest: normal respiratory effort, CTAB, no wheezes, rales, or rhonchi Abdominal: Soft. Non-tender, non-distended, bowel sounds are normal, no masses, organomegaly, or guarding present.  GU: no CVA tenderness Extremities: No cyanosis and no edema  Neurological: A&O x3, Strength is normal and symmetric bilaterally, cranial nerve II-XII are grossly intact, no focal motor deficit, sensory intact to light touch bilaterally.  Skin: Warm, dry and intact. On the dorsum of his left hand he has 2 patches of hyperpigmented maculopapular, and a similar area on the right lower extremity just posterior to the medial malleolus.  Psychiatric: Normal mood and affect. speech and behavior is normal. Judgment and thought content normal. Cognition and memory are normal.  Labs on Admission:  Basic Metabolic Panel:  Recent Labs Lab 01/05/14 2135  NA 139  K 3.5*  CL 100  CO2 23  GLUCOSE 102*  BUN 8  CREATININE 1.33  CALCIUM 9.6    Liver Function Tests: No results found for this basename: AST, ALT, ALKPHOS, BILITOT, PROT, ALBUMIN,  in the last 168 hours No results found for this basename: LIPASE, AMYLASE,  in the last 168 hours No results found for this basename: AMMONIA,  in the last 168 hours CBC:  Recent Labs Lab 01/05/14 2135  WBC 13.7*  NEUTROABS 10.3*  HGB 16.2  HCT 45.1  MCV 85.4  PLT 199   Cardiac Enzymes: No results found for this basename: CKTOTAL, CKMB, CKMBINDEX, TROPONINI,  in the last 168 hours  BNP (last 3 results) No results found for this basename: PROBNP,  in the last 8760 hours CBG: No results found for this basename: GLUCAP,  in the last 168 hours  Radiological Exams on Admission: Dg Chest 2 View  01/06/2014   CLINICAL DATA:  Cough, fever, headache.  EXAM: CHEST  2 VIEW  COMPARISON:  None.  FINDINGS: Normal heart size and mediastinal contours. No acute infiltrate or edema. No effusion or pneumothorax. No acute osseous findings.  IMPRESSION: No active cardiopulmonary disease.   Electronically Signed   By: Tiburcio PeaJonathan  Watts M.D.   On: 01/06/2014 06:47   Ct Head Wo Contrast  01/06/2014   CLINICAL DATA:  Headache, photophobia.  EXAM: CT HEAD WITHOUT CONTRAST  TECHNIQUE: Contiguous axial images were obtained from the base of the skull through the vertex without intravenous contrast.  COMPARISON:  None available for comparison at time of study interpretation.  FINDINGS: Motion degraded evaluation of the skullbase. The ventricles and sulci are normal. No intraparenchymal hemorrhage, mass effect nor midline shift. No acute large vascular territory infarcts.  No abnormal extra-axial fluid collections. Basal cisterns are patent.  No skull fracture. Visualized paranasal sinuses and mastoid air-cells are well-aerated. The included ocular globes and orbital contents are non-suspicious.  IMPRESSION: No acute intracranial process: Normal noncontrast CT of the head.   Electronically Signed   By: Awilda Metroourtnay   Bloomer   On: 01/06/2014 04:00      Assessment/Plan Active Problems:   Meningitis-bacterial versus versus viral. As discussed above, he has a high CSF white count but with ymphocyte predominance -He received IV Decadron in ED as above -I discussed patient with ID/Dr. Algis LimingVanDam and he recommends to continue Rocephin vancomycin and also add ampicillin and acyclovir -I have also added cryptococcal antigen to CSF, VDRL and HSV PCR was already ordered -Will also obtain HIV test RPR -ID/Snider to see patient in consultation.   Rash and nonspecific skin eruption- -serum RPR, and VDRL as above -ID to see for further recommendations the   Code Status: full Family Communication: family at bedside Disposition Plan: Patient is ready admitted to [address]  Time spent: >1230mins  Kela MillinVIYUOH,Tresia Revolorio C [facility] Pager [phone]

## 2014-01-06 NOTE — ED Notes (Signed)
Called bed placement stated waiting for airborne room.  Clarify AC stated droplet only need and able to go to assigned floor [address] [place]. Waiting for confirmation.

## 2014-01-07 DIAGNOSIS — B003 Herpesviral meningitis: Secondary | ICD-10-CM

## 2014-01-07 DIAGNOSIS — E876 Hypokalemia: Secondary | ICD-10-CM

## 2014-01-07 LAB — PATHOLOGIST SMEAR REVIEW

## 2014-01-07 LAB — BASIC METABOLIC PANEL
BUN: 10 mg/dL (ref 6–23)
CO2: 25 mEq/L (ref 19–32)
Calcium: 9.1 mg/dL (ref 8.4–10.5)
Chloride: 104 mEq/L (ref 96–112)
Creatinine, Ser: 1.09 mg/dL (ref 0.50–1.35)
GFR calc Af Amer: >90 mL/min (ref >90)
GFR calc non Af Amer: 90 mL/min — ABNORMAL LOW (ref >90)
GLUCOSE: 103 mg/dL — AB (ref 70–99)
POTASSIUM: 4 meq/L (ref 3.7–5.3)
Sodium: 141 mEq/L (ref 137–147)

## 2014-01-07 LAB — CBC
HCT: 40.8 % (ref 39.0–52.0)
HEMOGLOBIN: 14.2 g/dL (ref 13.0–17.0)
MCH: 29.7 pg (ref 26.0–34.0)
MCHC: 34.8 g/dL (ref 30.0–36.0)
MCV: 85.4 fL (ref 78.0–100.0)
Platelets: 191 10*3/uL (ref 150–400)
RBC: 4.78 MIL/uL (ref 4.22–5.81)
RDW: 16.7 % — ABNORMAL HIGH (ref 11.5–15.5)
WBC: 12.6 10*3/uL — ABNORMAL HIGH (ref 4.0–10.5)

## 2014-01-07 LAB — VDRL, CSF: SYPHILIS VDRL QUANT CSF: NONREACTIVE

## 2014-01-07 LAB — T.PALLIDUM AB, IGG: T pallidum Antibodies (TP-PA): 0.13 S/CO (ref <0.90)

## 2014-01-07 MED ORDER — ACYCLOVIR 400 MG PO TABS
400.0000 mg | ORAL_TABLET | Freq: Three times a day (TID) | ORAL | Status: DC
  Filled 2014-01-07 (×2): qty 1

## 2014-01-07 MED ORDER — ACYCLOVIR 200 MG PO CAPS
400.0000 mg | ORAL_CAPSULE | Freq: Three times a day (TID) | ORAL | Status: DC
  Administered 2014-01-07 – 2014-01-08 (×3): 400 mg via ORAL
  Filled 2014-01-07 (×5): qty 2

## 2014-01-07 MED ORDER — PENICILLIN G BENZATHINE 1200000 UNIT/2ML IM SUSP
2.4000 10*6.[IU] | INTRAMUSCULAR | Status: DC
  Administered 2014-01-07: 2.4 10*6.[IU] via INTRAMUSCULAR
  Filled 2014-01-07 (×2): qty 4

## 2014-01-07 NOTE — Progress Notes (Addendum)
[facility]    Date of Admission:  01/05/2014   Total days of antibiotics 2        Day 2 vanco        Day 2 ceftriaxone        Day 2 acyclovir   ID: Randy [name] is a 33 y.o. male with presents with viral meningitis and also complained of rash c/w secondary syphilis  Active Problems:   Meningitis   Rash and nonspecific skin eruption   Hypokalemia    Subjective: afebrile  Medications:  . acyclovir  800 mg Intravenous Q8H  . enoxaparin (LOVENOX) injection  40 mg Subcutaneous Q24H    Objective: Vital signs in last 24 hours: Temp:  [97.8 F (36.6 C)-98.5 F (36.9 C)] 97.8 F (36.6 C) (01/14 0417) Pulse Rate:  [70-97] 70 (01/14 0417) Resp:  [16-18] 16 (01/14 0417) BP: (115-138)/(74-84) 115/74 mmHg (01/14 0417) SpO2:  [97 %-99 %] 97 % (01/14 0417) Physical Exam  Constitutional: He is oriented to person, place, and time. He appears well-developed and well-nourished. No distress.  HENT:  Mouth/Throat: Oropharynx is clear and moist. No oropharyngeal exudate.  Cardiovascular: Normal rate, regular rhythm and normal heart sounds. Exam reveals no gallop and no friction rub.  No murmur heard.  Pulmonary/Chest: Effort normal and breath sounds normal. No respiratory distress. He has no wheezes.  Abdominal: Soft. Bowel sounds are normal. He exhibits no distension. There is no tenderness.  Lymphadenopathy:  He has no cervical adenopathy.  Neurological: He is alert and oriented to person, place, and time.  Skin: Skin is warm and dry. Scattered patch of raised lesions on arms, no palmar rash, ? Herpetic lesion on shaft of penis Psychiatric: He has a normal mood and affect. His behavior is normal.   Lab Results  Recent Labs  01/05/14 2135 01/07/14 0547  WBC 13.7* 12.6*  HGB 16.2 14.2  HCT 45.1 40.8  NA 139 141  K 3.5* 4.0  CL 100 104  CO2 23 25  BUN 8 10  CREATININE 1.33 1.09   Liver Panel No results found for this basename: PROT,  ALBUMIN, AST, ALT, ALKPHOS, BILITOT, BILIDIR, IBILI,  in the last 72 hours  Microbiology: Csf + hsv 2, cx negative rpr 1:4 Studies/Results: Dg Chest 2 View  01/06/2014   CLINICAL DATA:  Cough, fever, headache.  EXAM: CHEST  2 VIEW  COMPARISON:  None.  FINDINGS: Normal heart size and mediastinal contours. No acute infiltrate or edema. No effusion or pneumothorax. No acute osseous findings.  IMPRESSION: No active cardiopulmonary disease.   Electronically Signed   By: Tiburcio Pea M.D.   On: 01/06/2014 06:47   Ct Head Wo Contrast  01/06/2014   CLINICAL DATA:  Headache, photophobia.  EXAM: CT HEAD WITHOUT CONTRAST  TECHNIQUE: Contiguous axial images were obtained from the base of the skull through the vertex without intravenous contrast.  COMPARISON:  None available for comparison at time of study interpretation.  FINDINGS: Motion degraded evaluation of the skullbase. The ventricles and sulci are normal. No intraparenchymal hemorrhage, mass effect nor midline shift. No acute large vascular territory infarcts.  No abnormal extra-axial fluid collections. Basal cisterns are patent.  No skull fracture. Visualized paranasal sinuses and mastoid air-cells are well-aerated. The included ocular globes and orbital contents are non-suspicious.  IMPRESSION: No acute intracranial process: Normal noncontrast CT of the head.   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 We will d/c vanco and ceftriaxone. HSV-2 is self-limiting and doesn't necessarily requiring antiviral therapy. Since he is much improved within 24hrs of admit, recommend no further treatment for viral meningitis.  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 He will need to go to health dept for schedule 2nd and 3rd doses at  local health dept.will need to explain that the health dept will be contacting him regarding  syphilis testing and treatment and partner notification. Probably recommend that he repeats hiv testing in 3 months from last sexual contact. Currently hiv negative  Presumed genital herpes= finish out course with acyclovir 400mg  TID  x 7 days.  From the ID standpoint, he can be discharged.  - for dispo he will need pcn injection #2 in 1wk and pcn injection #3 in 2 wks. [facility] [phone]  Drue SecondSNIDER, [facility] Cell: [phone] Pager: [phone]  01/07/2014, 10:54 AM

## 2014-01-07 NOTE — Progress Notes (Signed)
[facility]                                                                              Patient Demographics  Randy [name], is a 33 y.o. male, DOB - [birth year], WUJ:[id number]  Admit date - 01/05/2014   Admitting Physician Haydee Monica, MD  Outpatient Primary MD for the patient is No PCP Per Patient  LOS - 2  Chief Complaint  Patient presents with  . Headache       Assessment & Plan    Active Problems:   Meningitis   Rash and nonspecific skin eruption  Headache secondary to Meningitis -Likely viral/aseptic -infectious disease consulted and following -CSF: lymphocytic predominance pleocytosis -HSV2+, RPR reactive 1:4, HIV and crypto Ag negative -Will likely discontinue antibiotics if "ok" with ID and continue acyclovir -headache improving, CT head: normal  Rash and Nonspecific Skin eruption -possibly secondary to above, will continue to monitor and await further recommendations  Hypokalemia -Resolved  Code Status: Full  Family Communication: None at bedside  Disposition Plan: Admitted   Procedures  Lumbar puncture  Consults   Infectious Disease  DVT Prophylaxis  Lovenox   Time Spent in minutes   35 minutes  Lab Results  Component Value Date   PLT 191 01/07/2014    Medications  Scheduled Meds: . acyclovir  800 mg Intravenous Q8H  . cefTRIAXone (ROCEPHIN)  IV  2 g Intravenous Q12H  . enoxaparin (LOVENOX) injection  40 mg Subcutaneous Q24H  . vancomycin  1,000 mg Intravenous Q8H   Continuous Infusions: . sodium chloride 75 mL/hr at 01/07/14 0244   PRN Meds:.acetaminophen, acetaminophen, HYDROcodone-acetaminophen, ondansetron (ZOFRAN) IV, ondansetron  Antibiotics    Anti-infectives   Start     Dose/Rate Route Frequency Ordered Stop   01/07/14 0800  cefTRIAXone (ROCEPHIN) 2 g in dextrose 5 % 50 mL IVPB  Status:  Discontinued     2 g 100 mL/hr over 30 Minutes Intravenous Every 24 hours 01/06/14 0921 01/06/14 1030   01/06/14 2000   cefTRIAXone (ROCEPHIN) 2 g in dextrose 5 % 50 mL IVPB     2 g 100 mL/hr over 30 Minutes Intravenous Every 12 hours 01/06/14 1030     01/06/14 1915  vancomycin (VANCOCIN) IVPB 1000 mg/200 mL premix     1,000 mg 200 mL/hr over 60 Minutes Intravenous Every 8 hours 01/06/14 1900     01/06/14 1200  ampicillin (OMNIPEN) 1 g in sodium chloride 0.9 % 50 mL IVPB  Status:  Discontinued     1 g 150 mL/hr over 20 Minutes Intravenous 4 times per day 01/06/14 0921 01/06/14 1029   01/06/14 1200  ampicillin (OMNIPEN) 2 g in sodium chloride 0.9 % 50 mL IVPB  Status:  Discontinued     2 g 150 mL/hr over 20 Minutes Intravenous 6 times per day 01/06/14 1029 01/06/14 1359   01/06/14 1100  acyclovir (ZOVIRAX) 800 mg in dextrose 5 % 150 mL IVPB     800 mg 166 mL/hr over 60 Minutes Intravenous 3 times per day 01/06/14 1044     01/06/14 0545  vancomycin (VANCOCIN) IVPB 1000 mg/200 mL premix     1,000 mg 200 mL/hr over  60 Minutes Intravenous  Once 01/06/14 0543 01/06/14 0735   01/06/14 0545  cefTRIAXone (ROCEPHIN) 2 g in dextrose 5 % 50 mL IVPB     2 g 100 mL/hr over 30 Minutes Intravenous  Once 01/06/14 0543 01/06/14 [id number]       Subjective:   Presley RaddleVictor Cannata seen and examined today. Still complains of headache and neck pain however improved. Patient also states his rash has been ongoing for quite some time, he was told it was scabies at one point and underwent treatment. No complaints of nausea, dizziness, abdominal pain, chest pain, shortness of breath.  Objective:   Filed Vitals:   01/06/14 0909 01/06/14 1400 01/06/14 2028 01/07/14 0417  BP: 121/69 126/84 138/84 115/74  Pulse: 60 84 97 70  Temp: 98.5 F (36.9 C) 98.5 F (36.9 C) 97.9 F (36.6 C) 97.8 F (36.6 C)  TempSrc: Oral  Oral Oral  Resp: 18 18 18 16   Height: 6\' 1"  (1.854 m)     Weight: 81.647 kg (180 lb)     SpO2: 100% 99% 99% 97%    Wt Readings from Last 3 Encounters:  01/06/14 81.647 kg (180 lb)     Intake/Output Summary (Last 24  hours) at 01/07/14 1005 Last data filed at 01/07/14 0745  Gross per 24 hour  Intake   3222 ml  Output   2200 ml  Net   1022 ml    Exam  General: Well developed, well nourished, NAD, appears stated age  HEENT: NCAT, PERRLA, EOMI, Anicteic Sclera, mucous membranes moist. No pharyngeal erythema or exudates  Neck: Supple, no JVD, no masses  Cardiovascular: S1 S2 auscultated, no rubs, murmurs or gallops. Regular rate and rhythm.  Respiratory: Clear to auscultation bilaterally with equal chest rise  Abdomen: Soft, nontender, nondistended, + bowel sounds  Extremities: warm dry without cyanosis clubbing or edema  Neuro: AAOx3, cranial nerves grossly intact. Strength 5/5 in patient's upper and lower extremities bilaterally  Skin: Without exudates or nodules, 2 patches of hyperpigmentation, maculopapular noted on the left dorsum of the hand, similar area on the right lower extremity posterior to the medial malleolus  Psych: Normal affect and demeanor with intact judgement and insight   Data Review   Micro Results Recent Results (from the past 240 hour(s))  CSF CULTURE     Status: None   Collection Time    01/06/14  4:23 AM      Result Value Range Status   Specimen Description CSF   Final   Special Requests TUBE 2, 2.5CC   Final   Gram Stain     Final   Value: CYTOSPIN SLIDE WBC PRESENT,BOTH PMN AND MONONUCLEAR     NO ORGANISMS SEEN     [facility]     Performed at [facility]   Culture PENDING   Incomplete   Report Status PENDING   Incomplete  GRAM STAIN     Status: None   Collection Time    01/06/14  4:23 AM      Result Value Range Status   Specimen Description CSF   Final   Special Requests TUBE 2, 2.5CC   Final   Gram Stain     Final   Value: CYTOSPIN SLIDE     WBC PRESENT,BOTH PMN AND MONONUCLEAR     NO ORGANISMS SEEN   Report Status 01/06/2014 FINAL   Final  CULTURE, BLOOD (ROUTINE X 2)     Status: None   Collection Time  01/06/14  6:00 AM      Result Value Range Status   Specimen Description BLOOD RIGHT HAND   Final   Special Requests BOTTLES DRAWN AEROBIC AND ANAEROBIC 10CC EA   Final   Culture  Setup Time     Final   Value: 01/06/2014 08:42     Performed at [facility]   Culture     Final   Value:        BLOOD CULTURE RECEIVED NO GROWTH TO DATE CULTURE WILL BE HELD FOR 5 DAYS BEFORE ISSUING A FINAL NEGATIVE REPORT     Performed at [facility]   Report Status PENDING   Incomplete  CULTURE, BLOOD (ROUTINE X 2)     Status: None   Collection Time    01/06/14  6:08 AM      Result Value Range Status   Specimen Description BLOOD LEFT ARM   Final   Special Requests BOTTLES DRAWN AEROBIC ONLY 10CC   Final   Culture  Setup Time     Final   Value: 01/06/2014 08:42     Performed at [facility]   Culture     Final   Value:        BLOOD CULTURE RECEIVED NO GROWTH TO DATE CULTURE WILL BE HELD FOR 5 DAYS BEFORE ISSUING A FINAL NEGATIVE REPORT     Performed at [facility]   Report Status PENDING   Incomplete    Radiology Reports Dg Chest 2 View  01/06/2014   CLINICAL DATA:  Cough, fever, headache.  EXAM: CHEST  2 VIEW  COMPARISON:  None.  FINDINGS: Normal heart size and mediastinal contours. No acute infiltrate or edema. No effusion or pneumothorax. No acute osseous findings.  IMPRESSION: No active cardiopulmonary disease.   Electronically Signed   By: Tiburcio Pea M.D.   On: 01/06/2014 06:47   Ct Head Wo Contrast  01/06/2014   CLINICAL DATA:  Headache, photophobia.  EXAM: CT HEAD WITHOUT CONTRAST  TECHNIQUE: Contiguous axial images were obtained from the base of the skull through the vertex without intravenous contrast.  COMPARISON:  None available for comparison at time of study interpretation.  FINDINGS: Motion degraded evaluation of the skullbase. The ventricles and sulci are normal. No intraparenchymal hemorrhage, mass effect nor midline shift. No acute large vascular  territory infarcts.  No abnormal extra-axial fluid collections. Basal cisterns are patent.  No skull fracture. Visualized paranasal sinuses and mastoid air-cells are well-aerated. The included ocular globes and orbital contents are non-suspicious.  IMPRESSION: No acute intracranial process: Normal noncontrast CT of the head.   Electronically Signed   By: Awilda Metro   On: 01/06/2014 04:00    CBC  Recent Labs Lab 01/05/14 2135 01/07/14 0547  WBC 13.7* 12.6*  HGB 16.2 14.2  HCT 45.1 40.8  PLT 199 191  MCV 85.4 85.4  MCH 30.7 29.7  MCHC 35.9 34.8  RDW 16.3* 16.7*  LYMPHSABS 2.2  --   MONOABS 0.9  --   EOSABS 0.3  --   BASOSABS 0.1  --     Chemistries   Recent Labs Lab 01/05/14 2135 01/07/14 0547  NA 139 141  K 3.5* 4.0  CL 100 104  CO2 23 25  GLUCOSE 102* 103*  BUN 8 10  CREATININE 1.33 1.09  CALCIUM 9.6 9.1   ------------------------------------------------------------------------------------------------------------------ estimated creatinine clearance is 112 ml/min (by C-G formula based on Cr of 1.09). ------------------------------------------------------------------------------------------------------------------ No results found for this basename: HGBA1C,  in the last 72 hours ------------------------------------------------------------------------------------------------------------------ No results found for this basename: CHOL, HDL, LDLCALC, TRIG, CHOLHDL, LDLDIRECT,  in the last 72 hours ------------------------------------------------------------------------------------------------------------------ No results found for this basename: TSH, T4TOTAL, FREET3, T3FREE, THYROIDAB,  in the last 72 hours ------------------------------------------------------------------------------------------------------------------ No results found for this basename: VITAMINB12, FOLATE, FERRITIN, TIBC, IRON, RETICCTPCT,  in the last 72 hours  Coagulation profile No results  found for this basename: INR, PROTIME,  in the last 168 hours  No results found for this basename: DDIMER,  in the last 72 hours  Cardiac Enzymes No results found for this basename: CK, CKMB, TROPONINI, MYOGLOBIN,  in the last 168 hours ------------------------------------------------------------------------------------------------------------------ No components found with this basename: POCBNP,    Shantavia Jha D.O. on 01/07/2014 at 10:05 AM  Between 7am to 7pm - Pager - [phone]  After 7pm go to [email or web address] - password [email or web address]  And look for the night coverage person covering for me after hours  [facility] Office  [phone]

## 2014-01-08 DIAGNOSIS — A530 Latent syphilis, unspecified as early or late: Secondary | ICD-10-CM

## 2014-01-08 MED ORDER — PENICILLIN G BENZATHINE 1200000 UNIT/2ML IM SUSP: 2.4000 10*6.[IU] | INTRAMUSCULAR | Status: DC

## 2014-01-08 MED ORDER — ACYCLOVIR 200 MG PO CAPS
400.0000 mg | ORAL_CAPSULE | Freq: Three times a day (TID) | ORAL | Status: AC
Start: 2014-01-08 — End: 2014-01-13

## 2014-01-08 NOTE — Discharge Instructions (Signed)
Viral Meningitis Meningitis is an inflammation of the fluid and membranes surrounding the spinal cord and brain. Viral meningitis is caused by a viral infection. It is important to know whether a virus or bacterium is causing your meningitis. Viral meningitis is generally less severe than bacterial meningitis. Aseptic meningitis is any meningitis not caused by a bacterial infection, including viral meningitis. Meningitis can also be caused by fungi, parasites, certain medicines, cancer, and autoimmune disorders. Uncomplicated meningitis usually resolves on its own in 7 to 10 days.However, there can be complicated cases that last much longer. People with lowered immune systems are at greater risk for poor outcomes from meningitis.It is important to get treatment as soon as possible to minimize the impact of a meningitis infection. Long-term complications could include seizures, hearing loss, weakness, paralysis, blindness, or cognitive impairment. CAUSES Most cases of meningitis are due to viruses. Viruses that cause meningitis include enteroviruses (such as polio and coxsackie viruses), herpes simplex virus, varicella zoster virus, mumps, and HIV. SYMPTOMS  Symptoms can develop over many hours. They may even take a few days to develop. Common symptoms of meningitis in people over the age of 2 years include:  High fever.  Headache.  Stiff neck.  Irritability.  Nausea and vomiting.  Fatigue.  Discomfort from exposure to light.  Discomfort from exposure to loud noise.  Trouble walking.  Altered mental status.  Seizures. DIAGNOSIS  Early diagnosis and treatment are very important. If you have symptoms, you should see your caregiver right away. Your evaluation may include lab tests of your blood and spinal fluid. A spinal fluid sample is taken through a procedure called a lumbar puncture or spinal tap. During the procedure, a needle is inserted into an area in the lower back. You may also  have a CT scan of your brain as part of your evaluation.If there is suspicion of an infection or inflammation of the brain (encephalitis), an MRI scan may be done. TREATMENT   Antibiotics are not effective against a virus. Antiviral medicines may be used depending on the cause of your meningitis.  Treatment for viral meningitis focuses on reducing your symptoms. This may include rest, hydration, and medicines to reduce fever and pain.  Steroids may also be used if there is significant swelling of the brain. PREVENTION  Vaccines can help prevent meningitis caused by polio, measles, and mumps.  Strict hand washing is effective in controlling the spread of many causes of meningitis.  Using barrier protection during sexual intercourse can prevent the spread of meningitis caused by viruses such as the herpes virus or HIV.  Protection from mosquitoes, especially for the very young and very old, can prevent some causes of meningitis. HOME CARE INSTRUCTIONS  Rest.  Drink enough water and fluids to keep your urine clear or pale yellow.  Take all medicine as prescribed.  Practice good hygiene to prevent others from getting sick.  Follow up with your caregiver as directed. SEEK IMMEDIATE MEDICAL CARE IF:  You develop dizziness.  You have a very fast heartbeat.  You have difficulty breathing.  You develop confusion.  You have seizures.  You have unstoppable nausea and vomiting.  You develop any worsening symptoms. MAKE SURE YOU:  Understand these instructions.  Will watch your condition.  Will get help right away if you are not doing well or get worse. Document Released: 12/01/2002 Document Revised: 03/04/2012 Document Reviewed: 03/28/2011 [facility] Patient Information 2014 [employer] [place].   Call [facility] [phone] to schedule appointments.

## 2014-01-08 NOTE — Progress Notes (Signed)
Pt discharged home. D/c instructions given, no questions verbalized. Vitals stable. Pt ambulatory.

## 2014-01-08 NOTE — Discharge Summary (Signed)
Physician Discharge Summary  Randy [name] [id number] DOB: 24-Apr-1981 DOA: 01/05/2014  PCP: No PCP Per Patient  Admit date: 01/05/2014 Discharge date: 01/08/2014  Time spent: 35 minutes  Recommendations for Outpatient Follow-up:  [facility]. He should also follow Dr. Ilsa IhaSnyder within one to 2 weeks of discharge. Patient should establish care with her primary care physician within one week of discharge. Patient should continue taking his medications as prescribed.  Discharge Diagnoses:  Active Problems:   Meningitis   Rash and nonspecific skin eruption   Hypokalemia   Discharge Condition: Stable  Diet recommendation: Regular diet  Filed Weights   01/05/14 2111 01/06/14 0909  Weight: 83.915 kg (185 lb) 81.647 kg (180 lb)    History of present illness:  Randy [name] is a 33 y.o. male who presents with complaints of worsening headachex1day. He states the headache -mostly right hemispheric and behind his right eye. He admits to associated photophobia and nausea but denies phonophobia. Patient also reports that he's had a neck pain and stiffness of for the same duration. he denies fevers. He reports that he's had a rash on his left hand and right foot for some time, that he had it in the past and was prescribed 'some medication'and it resolved but came back again. He was seen in the ED and a CT of his head showed no acute intracranial process. LP was done and tube #4cell count showed WBC 439, 88% lymphocytes, protein 132, glucose 57. He was given a dose of Decadron 10 mg in the ED and started on high-dose Rocephin, as well as vancomycin and is admitted for further evaluation and management.   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 -[facility], this is been set up by Dr. Drue SecondSnider -Patient also counseled on safe sex practices.  Hypokalemia  -Resolved, unknown etiology  Procedures: Lumbar puncture  Consultations: Infectious disease  Discharge Exam: Filed Vitals:   01/08/14 0633  BP: 131/79  Pulse: 78  Temp: 98.8 F (37.1 C)  Resp: 18   Exam  General: Well developed, well nourished, NAD, appears stated age  HEENT: NCAT, PERRLA, EOMI, Anicteic Sclera, mucous membranes moist. Neck: Supple, no JVD, no masses  Cardiovascular: S1 S2 auscultated, no rubs, murmurs or gallops. Regular rate and rhythm.  Respiratory: Clear to auscultation bilaterally with equal chest rise  Abdomen: Soft, nontender, nondistended, + bowel sounds  Extremities: warm dry without cyanosis clubbing or edema  Neuro: AAOx3, cranial nerves grossly intact.  Skin: Without exudates or nodules, 2 patches of hyperpigmentation, maculopapular noted on the left dorsum of the hand, similar area on the right lower extremity posterior to the medial malleolus  Psych: Normal affect and demeanor with intact judgement and insight  Discharge Instructions  Discharge Orders   Future Orders Complete By Expires   Diet - low sodium heart healthy  As directed  Discharge instructions  As directed    Comments:     Patient is to [facility]up with [facility] for for penicillin injections weekly. He should also follow Dr. Ilsa Iha within one to 2 weeks of discharge.  Patient should establish care with her primary care physician within one week of discharge. Patient should continue taking his medications as prescribed.   Increase activity slowly  As directed        Medication List    STOP taking these medications       BC HEADACHE POWDER PO      TAKE these medications       acyclovir 200 MG capsule  Commonly known as:  ZOVIRAX  Take 2 capsules (400 mg total) by mouth 3 (three) times daily.     penicillin g benzathine 1200000 UNIT/2ML Susp injection  Commonly known as:  BICILLIN LA  Inject 4 mLs (2.4 Million Units total) into the muscle once a week.       No Known Allergies     Follow-up Information   Follow up with Judyann Munson, MD. Schedule an appointment as soon as possible for a visit in 1 week.   Specialty:  Infectious Diseases   Contact information:   [address] [place] [address] [phone]       Follow up with Primary care physician. Schedule an appointment as soon as possible for a visit in 1 week.       The results of significant diagnostics from this hospitalization (including imaging, microbiology, ancillary and laboratory) are listed below for reference.    Significant Diagnostic Studies: Dg Chest 2 View  01/06/2014   CLINICAL DATA:  Cough, fever, headache.  EXAM: CHEST  2 VIEW  COMPARISON:  None.  FINDINGS: Normal heart size and mediastinal contours. No acute infiltrate or edema. No effusion or pneumothorax. No acute osseous findings.  IMPRESSION: No active cardiopulmonary disease.   Electronically Signed   By: Tiburcio Pea M.D.   On: 01/06/2014 06:47   Ct Head Wo Contrast  01/06/2014   CLINICAL DATA:  Headache, photophobia.  EXAM: CT HEAD WITHOUT CONTRAST  TECHNIQUE: Contiguous axial images were obtained from the base of the skull through the vertex without intravenous contrast.  COMPARISON:  None available for comparison at time of study interpretation.  FINDINGS: Motion degraded  evaluation of the skullbase. The ventricles and sulci are normal. No intraparenchymal hemorrhage, mass effect nor midline shift. No acute large vascular territory infarcts.  No abnormal extra-axial fluid collections. Basal cisterns are patent.  No skull fracture. Visualized paranasal sinuses and mastoid air-cells are well-aerated. The included ocular globes and orbital contents are non-suspicious.  IMPRESSION: No acute intracranial process: Normal noncontrast CT of the head.   Electronically Signed   By: Awilda Metro   On: 01/06/2014 04:00    Microbiology: Recent Results (from the past 240 hour(s))  CSF CULTURE     Status: None   Collection Time    01/06/14  4:23 AM      Result Value Range Status   Specimen Description CSF   Final   Special Requests TUBE 2, 2.5CC   Final   Gram Stain     Final   Value: CYTOSPIN SLIDE WBC PRESENT,BOTH PMN AND MONONUCLEAR     NO ORGANISMS SEEN     Performed at [facility]     Performed at [facility]   Culture     Final   Value: NO  GROWTH 1 DAY     Performed at [facility]   Report Status PENDING   Incomplete  GRAM STAIN     Status: None   Collection Time    01/06/14  4:23 AM      Result Value Range Status   Specimen Description CSF   Final   Special Requests TUBE 2, 2.5CC   Final   Gram Stain     Final   Value: CYTOSPIN SLIDE     WBC PRESENT,BOTH PMN AND MONONUCLEAR     NO ORGANISMS SEEN   Report Status 01/06/2014 FINAL   Final  CULTURE, BLOOD (ROUTINE X 2)     Status: None   Collection Time    01/06/14  6:00 AM      Result Value Range Status   Specimen Description BLOOD RIGHT HAND   Final   Special Requests BOTTLES DRAWN AEROBIC AND ANAEROBIC 10CC EA   Final   Culture  Setup Time     Final   Value: 01/06/2014 08:42     Performed at [facility]   Culture     Final   Value:        BLOOD CULTURE RECEIVED NO GROWTH TO DATE CULTURE WILL BE HELD FOR 5 DAYS BEFORE ISSUING A FINAL NEGATIVE REPORT     Performed  at [facility]   Report Status PENDING   Incomplete  CULTURE, BLOOD (ROUTINE X 2)     Status: None   Collection Time    01/06/14  6:08 AM      Result Value Range Status   Specimen Description BLOOD LEFT ARM   Final   Special Requests BOTTLES DRAWN AEROBIC ONLY 10CC   Final   Culture  Setup Time     Final   Value: 01/06/2014 08:42     Performed at [facility]   Culture     Final   Value:        BLOOD CULTURE RECEIVED NO GROWTH TO DATE CULTURE WILL BE HELD FOR 5 DAYS BEFORE ISSUING A FINAL NEGATIVE REPORT     Performed at [facility]   Report Status PENDING   Incomplete     Labs: Basic Metabolic Panel:  Recent Labs Lab 01/05/14 2135 01/07/14 0547  NA 139 141  K 3.5* 4.0  CL 100 104  CO2 23 25  GLUCOSE 102* 103*  BUN 8 10  CREATININE 1.33 1.09  CALCIUM 9.6 9.1   Liver Function Tests: No results found for this basename: AST, ALT, ALKPHOS, BILITOT, PROT, ALBUMIN,  in the last 168 hours No results found for this basename: LIPASE, AMYLASE,  in the last 168 hours No results found for this basename: AMMONIA,  in the last 168 hours CBC:  Recent Labs Lab 01/05/14 2135 01/07/14 0547  WBC 13.7* 12.6*  NEUTROABS 10.3*  --   HGB 16.2 14.2  HCT 45.1 40.8  MCV 85.4 85.4  PLT 199 191   Cardiac Enzymes: No results found for this basename: CKTOTAL, CKMB, CKMBINDEX, TROPONINI,  in the last 168 hours BNP: BNP (last 3 results) No results found for this basename: PROBNP,  in the last 8760 hours CBG: No results found for this basename: GLUCAP,  in the last 168 hours     Signed:  Edsel Petrin  [facility] 01/08/2014, 10:00 AM

## 2014-01-09 LAB — CSF CULTURE: CULTURE: NO GROWTH

## 2014-01-12 LAB — CULTURE, BLOOD (ROUTINE X 2)
CULTURE: NO GROWTH
Culture: NO GROWTH

## 2014-02-16 ENCOUNTER — Ambulatory Visit (INDEPENDENT_AMBULATORY_CARE_PROVIDER_SITE_OTHER): Payer: BC Managed Care – PPO | Admitting: Family Medicine

## 2014-02-16 ENCOUNTER — Ambulatory Visit: Payer: BC Managed Care – PPO

## 2014-02-16 VITALS — BP 128/82 | HR 77 | Temp 97.9°F | Resp 16 | Ht 72.5 in | Wt 189.0 lb

## 2014-02-16 DIAGNOSIS — R21 Rash and other nonspecific skin eruption: Secondary | ICD-10-CM

## 2014-02-16 DIAGNOSIS — M25579 Pain in unspecified ankle and joints of unspecified foot: Secondary | ICD-10-CM

## 2014-02-16 DIAGNOSIS — L0291 Cutaneous abscess, unspecified: Secondary | ICD-10-CM

## 2014-02-16 DIAGNOSIS — L0390 Cellulitis, unspecified: Secondary | ICD-10-CM

## 2014-02-16 LAB — POCT CBC
Granulocyte percent: 70.7 %G (ref 37–80)
HCT, POC: 47.5 % (ref 43.5–53.7)
Hemoglobin: 14.7 g/dL (ref 14.1–18.1)
Lymph, poc: 2.6 (ref 0.6–3.4)
MCH, POC: 28.9 pg (ref 27–31.2)
MCHC: 30.9 g/dL — AB (ref 31.8–35.4)
MCV: 93.4 fL (ref 80–97)
MID (cbc): 0.7 (ref 0–0.9)
MPV: 10.4 fL (ref 0–99.8)
POC Granulocyte: 8.1 — AB (ref 2–6.9)
POC LYMPH PERCENT: 23 %L (ref 10–50)
POC MID %: 6.3 %M (ref 0–12)
Platelet Count, POC: 254 10*3/uL (ref 142–424)
RBC: 5.09 M/uL (ref 4.69–6.13)
RDW, POC: 17.5 %
WBC: 11.5 10*3/uL — AB (ref 4.6–10.2)

## 2014-02-16 LAB — C-REACTIVE PROTEIN: CRP: 1.8 mg/dL — ABNORMAL HIGH (ref <0.60)

## 2014-02-16 LAB — POCT GLYCOSYLATED HEMOGLOBIN (HGB A1C): Hemoglobin A1C: 5.1

## 2014-02-16 LAB — POCT SEDIMENTATION RATE: POCT SED RATE: 18 mm/hr (ref 0–22)

## 2014-02-16 MED ORDER — DOXYCYCLINE HYCLATE 100 MG PO TABS: 100.0000 mg | ORAL_TABLET | Freq: Two times a day (BID) | ORAL | Status: DC

## 2014-02-16 MED ORDER — CEFTRIAXONE SODIUM 1 G IJ SOLR
1.0000 g | Freq: Once | INTRAMUSCULAR | Status: AC
  Administered 2014-02-16: 1 g via INTRAMUSCULAR

## 2014-02-16 NOTE — Patient Instructions (Signed)
Abscess °An abscess (boil or furuncle) is an infected area on or under the skin. This area is filled with yellowish-white fluid (pus) and other material (debris). °HOME CARE  °· Only take medicines as told by your doctor. °· If you were given antibiotic medicine, take it as directed. Finish the medicine even if you start to feel better. °· If gauze is used, follow your doctor's directions for changing the gauze. °· To avoid spreading the infection: °· Keep your abscess covered with a bandage. °· Wash your hands well. °· Do not share personal care items, towels, or whirlpools with others. °· Avoid skin contact with others. °· Keep your skin and clothes clean around the abscess. °· Keep all doctor visits as told. °GET HELP RIGHT AWAY IF:  °· You have more pain, puffiness (swelling), or redness in the wound site. °· You have more fluid or blood coming from the wound site. °· You have muscle aches, chills, or you feel sick. °· You have a fever. °MAKE SURE YOU:  °· Understand these instructions. °· Will watch your condition. °· Will get help right away if you are not doing well or get worse. °Document Released: 05/29/2008 Document Revised: 06/11/2012 Document Reviewed: 02/23/2012 °ExitCare® Patient Information ©2014 Exit[employer], LLC. ° °Cellulitis °Cellulitis is an infection of the skin and the tissue under the skin. The infected area is usually red and tender. This happens most often in the arms and lower legs. °HOME CARE  °· Take your antibiotic medicine as told. Finish the medicine even if you start to feel better. °· Keep the infected arm or leg raised (elevated). °· Put a warm cloth on the area up to 4 times per day. °· Only take medicines as told by your doctor. °· Keep all doctor visits as told. °GET HELP RIGHT AWAY IF:  °· You have a fever. °· You feel very sleepy. °· You throw up (vomit) or have watery poop (diarrhea). °· You feel sick and have muscle aches and pains. °· You see red streaks on the skin coming from  the infected area. °· Your red area gets bigger or turns a dark color. °· Your bone or joint under the infected area is painful after the skin heals. °· Your infection comes back in the same area or different area. °· You have a puffy (swollen) bump in the infected area. °· You have new symptoms. °MAKE SURE YOU:  °· Understand these instructions. °· Will watch your condition. °· Will get help right away if you are not doing well or get worse. °Document Released: 05/29/2008 Document Revised: 06/11/2012 Document Reviewed: 02/26/2012 °ExitCare® Patient Information ©2014 Exit[employer] LLC. ° °

## 2014-02-16 NOTE — Progress Notes (Signed)
Chief Complaint:  Chief Complaint  Patient presents with  . Ankle Injury    right ankle x 2 day  . Rash    all over body x 2 mth    HPI: Randy [name]. is a 33 y.o. male who is here for  1 week history of Right foot pain, right ankle rash  With worsening sxs in last 2-3 days.  He had a rash over 1 month ago and was treated for scabies along with his son, rash was all over his body and then got better but not completely resolved.  He then went to the ER on 12/2013 for HA, nuchal rigidity, and rash and was treated with decadron, rocephin, vancomycin for viral/aseptic meningitis and then also 7 day course of acyclovir and PCN IM for ? Rash secondary to HSV  and latent syphilis since his RPR was reactive.  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  He now presents with a 2 day hx of worsening right inner ankle pain,redness, swelling. Not associated with fevers, chills, unintentional weight loss. He was recently treated for viral/aseptic meningitis  on 12/2013 at [facility] ER and was tested for HIV, syphilis,etc. He was positive for HSV 2. He states that the area is less painful when he walks on it, more painful when he sits and then starts to use it.  He has had NKI. He works as a Dealer.    Discharge Summary below from 01/08/2014: Recommendations for Outpatient Follow-up:  Patient is to followup with [facility] for for penicillin injections weekly. He should also follow Dr. Graylon Good within one to 2 weeks of discharge. Patient should establish care with her primary care physician within one week of discharge. Patient should continue taking his medications as prescribed.  Discharge Diagnoses:  Active Problems:  Meningitis  Rash and nonspecific skin eruption  Hypokalemia  Discharge Condition: Stable  Diet recommendation: Regular diet  Filed Weights    01/05/14 2111  01/06/14 0909     Weight:  83.915 kg (185 lb)  81.647 kg (180 lb)   History of present illness:  Randy [name] is a 33 y.o. male who presents with complaints of worsening headachex1day. He states the headache -mostly right hemispheric and behind his right eye. He admits to associated photophobia and nausea but denies phonophobia. Patient also reports that he's had a neck pain and stiffness of for the same duration. he denies fevers. He reports that he's had a rash on his left hand and right foot for some time, that he had it in the past and was prescribed 'some medication'and it resolved but came back again. He was seen in the ED and a CT of his head showed no acute intracranial process. LP was done and tube #4cell count showed WBC 439, 88% lymphocytes, protein 132, glucose 57. He was given a dose of Decadron 10 mg in the ED and started on high-dose Rocephin, as well as vancomycin and is admitted for further evaluation and management.  Hospital Course:  Headache secondary to Meningitis  -Likely viral/aseptic  -infectious disease consulted and and recommended continuance of acyclovir for interruption, , no recommendations as far as treating HSV meningitis as this is self-limiting  -CSF: lymphocytic predominance pleocytosis  -HSV2+, RPR reactive 1:4, HIV and crypto Ag negative  -Will likely discontinue antibiotics if "ok" with ID and continue acyclovir  -headache improving, CT  head: normal  Rash and Nonspecific Skin eruption secondary to secondary syphilis  -Continue acyclovir for a total of 7 days and PCN injections for the next 2 weeks.  -Patient will need followup with [facility] for his injections, this is been set up by Dr. Baxter Flattery  -Patient also counseled on safe sex practices.   Past Medical History  Diagnosis Date  . Migraines   . Seizures    History reviewed. No pertinent past surgical history. History   Social History  . Marital Status: Single    Spouse Name: N/A     Number of Children: N/A  . Years of Education: N/A   Social History Main Topics  . Smoking status: Current Every Day Smoker    Types: Cigarettes  . Smokeless tobacco: None  . Alcohol Use: Yes  . Drug Use: No  . Sexual Activity: None   Other Topics Concern  . None   Social History Narrative  . None   History reviewed. No pertinent family history. No Known Allergies Prior to Admission medications   Medication Sig Start Date End Date Taking? Authorizing Provider  penicillin g benzathine (BICILLIN LA) 1200000 UNIT/2ML SUSP injection Inject 4 mLs (2.4 Million Units total) into the muscle once a week. 01/08/14   Maryann Mikhail, DO     ROS: The patient denies fevers, chills, night sweats, unintentional weight loss, chest pain, palpitations, wheezing, dyspnea on exertion, nausea, vomiting, abdominal pain, dysuria, hematuria, melena, numbness, weakness, or tingling.  All other systems have been reviewed and were otherwise negative with the exception of those mentioned in the HPI and as above.    PHYSICAL EXAM: Filed Vitals:   02/16/14 1243  BP: 128/82  Pulse: 77  Temp: 97.9 F (36.6 C)  Resp: 16   Filed Vitals:   02/16/14 1243  Height: 6' 0.5" (1.842 m)  Weight: 189 lb (85.73 kg)   Body mass index is 25.27 kg/(m^2).  General: Alert, no acute distress HEENT:  Normocephalic, atraumatic, oropharynx patent. EOMI, PERRLA Cardiovascular:  Regular rate and rhythm, no rubs murmurs or gallops.  No Carotid bruits, radial pulse intact. No pedal edema.  Respiratory: Clear to auscultation bilaterally.  No wheezes, rales, or rhonchi.  No cyanosis, no use of accessory musculature GI: No organomegaly, abdomen is soft and non-tender, positive bowel sounds.  No masses. Skin: Rashes. + excoriated, dry scab, no rashes on palms or soles of his feet. + erythematous right inner ankle abscess 2 x 2 cm,  erythema, fluctuant but still hard.  Neurologic: Facial musculature symmetric. Psychiatric:  Patient is appropriate throughout our interaction. Lymphatic: No cervical lymphadenopathy, axillary LAD, submandibular LAD Musculoskeletal: Gait intact. 5/5 strength, sensation intact, no crepitus, 2/2 ankle DTRs   LABS: Results for orders placed in visit on 02/16/14  POCT CBC      Result Value Ref Range   WBC 11.5 (*) 4.6 - 10.2 K/uL   Lymph, poc 2.6  0.6 - 3.4   POC LYMPH PERCENT 23.0  10 - 50 %L   MID (cbc) 0.7  0 - 0.9   POC MID % 6.3  0 - 12 %M   POC Granulocyte 8.1 (*) 2 - 6.9   Granulocyte percent 70.7  37 - 80 %G   RBC 5.09  4.69 - 6.13 M/uL   Hemoglobin 14.7  14.1 - 18.1 g/dL   HCT, POC 47.5  43.5 - 53.7 %   MCV 93.4  80 - 97 fL   MCH, POC 28.9  27 - 31.2 pg   MCHC 30.9 (*) 31.8 - 35.4 g/dL   RDW, POC 17.5     Platelet Count, POC 254  142 - 424 K/uL   MPV 10.4  0 - 99.8 fL  POCT GLYCOSYLATED HEMOGLOBIN (HGB A1C)      Result Value Ref Range   Hemoglobin A1C 5.1       EKG/XRAY:   Primary read interpreted by Dr. Marin Comment at [facility]. No obvious fx or dislocation   ASSESSMENT/PLAN: Encounter Diagnoses  Name Primary?  . Pain in joint, ankle and foot Yes  . Rash and nonspecific skin eruption   . Cellulitis and abscess    Rule out osteomyelitis with labs/xray since so close to ankle joint. I suspect it is primarily cellulitis and abscess, source may be from the rash he has had for the last 1.5 months.  Please see HPI, he was last seen in ER on 01/05/14 and worked up for viral/aseptic meningitis and also HIV, RPR, and HSV testing were done. Treated for HSV and syphilis, although only RPR was reactive and T palladium was nonreactive Given Rocephin 1 gram IM x 1 in office today Rx Doxycycline 100 mg BID x 10 days Warm compresses, elevation Screening for DM was negative for diabetes, ESR and CRP pending F/u prn , precautions given, otherwise we will see him in 2 days   Gross sideeffects, risk and benefits, and alternatives of medications d/w patient. Patient is aware that all  medications have potential sideeffects and we are unable to predict every sideeffect or drug-drug interaction that may occur.  Deangela Randleman, [place], DO 02/16/2014 2:14 PM

## 2014-04-23 ENCOUNTER — Encounter (HOSPITAL_COMMUNITY): Payer: Self-pay | Admitting: Emergency Medicine

## 2014-04-23 ENCOUNTER — Emergency Department (HOSPITAL_COMMUNITY)
Admission: EM | Admit: 2014-04-23 | Discharge: 2014-04-23 | Disposition: A | Discharge status: Home / Self Care | Payer: BC Managed Care – PPO | Attending: Emergency Medicine | Admitting: Emergency Medicine

## 2014-04-23 DIAGNOSIS — L03211 Cellulitis of face: Principal | ICD-10-CM

## 2014-04-23 DIAGNOSIS — F17200 Nicotine dependence, unspecified, uncomplicated: Secondary | ICD-10-CM | POA: Insufficient documentation

## 2014-04-23 DIAGNOSIS — L0201 Cutaneous abscess of face: Secondary | ICD-10-CM | POA: Insufficient documentation

## 2014-04-23 DIAGNOSIS — L0390 Cellulitis, unspecified: Secondary | ICD-10-CM

## 2014-04-23 DIAGNOSIS — G43909 Migraine, unspecified, not intractable, without status migrainosus: Secondary | ICD-10-CM | POA: Insufficient documentation

## 2014-04-23 LAB — CBC WITH DIFFERENTIAL/PLATELET
BASOS PCT: 0 % (ref 0–1)
Basophils Absolute: 0 10*3/uL (ref 0.0–0.1)
EOS PCT: 3 % (ref 0–5)
Eosinophils Absolute: 0.5 10*3/uL (ref 0.0–0.7)
HCT: 47.4 % (ref 39.0–52.0)
HEMOGLOBIN: 16.6 g/dL (ref 13.0–17.0)
LYMPHS ABS: 2.1 10*3/uL (ref 0.7–4.0)
Lymphocytes Relative: 13 % (ref 12–46)
MCH: 30.2 pg (ref 26.0–34.0)
MCHC: 35 g/dL (ref 30.0–36.0)
MCV: 86.3 fL (ref 78.0–100.0)
MONO ABS: 1 10*3/uL (ref 0.1–1.0)
Monocytes Relative: 6 % (ref 3–12)
Neutro Abs: 12.4 10*3/uL — ABNORMAL HIGH (ref 1.7–7.7)
Neutrophils Relative %: 78 % — ABNORMAL HIGH (ref 43–77)
Platelets: 270 10*3/uL (ref 150–400)
RBC: 5.49 MIL/uL (ref 4.22–5.81)
RDW: 15.8 % — ABNORMAL HIGH (ref 11.5–15.5)
WBC: 16 10*3/uL — ABNORMAL HIGH (ref 4.0–10.5)

## 2014-04-23 LAB — BASIC METABOLIC PANEL
BUN: 11 mg/dL (ref 6–23)
CO2: 23 meq/L (ref 19–32)
CREATININE: 1.11 mg/dL (ref 0.50–1.35)
Calcium: 9.7 mg/dL (ref 8.4–10.5)
Chloride: 102 mEq/L (ref 96–112)
GFR calc Af Amer: >90 mL/min (ref >90)
GFR calc non Af Amer: 88 mL/min — ABNORMAL LOW (ref >90)
GLUCOSE: 98 mg/dL (ref 70–99)
Potassium: 4 mEq/L (ref 3.7–5.3)
Sodium: 141 mEq/L (ref 137–147)

## 2014-04-23 MED ORDER — ACETAMINOPHEN 325 MG PO TABS
650.0000 mg | ORAL_TABLET | Freq: Once | ORAL | Status: AC
  Administered 2014-04-23: 650 mg via ORAL
  Filled 2014-04-23: qty 2

## 2014-04-23 MED ORDER — DOXYCYCLINE HYCLATE 100 MG PO CAPS: 100.0000 mg | ORAL_CAPSULE | Freq: Two times a day (BID) | ORAL | Status: DC

## 2014-04-23 MED ORDER — HYDROMORPHONE HCL PF 1 MG/ML IJ SOLN
1.0000 mg | Freq: Once | INTRAMUSCULAR | Status: AC
  Administered 2014-04-23: 1 mg via INTRAVENOUS
  Filled 2014-04-23: qty 1

## 2014-04-23 MED ORDER — IBUPROFEN 800 MG PO TABS: 800.0000 mg | ORAL_TABLET | Freq: Three times a day (TID) | ORAL | Status: DC

## 2014-04-23 MED ORDER — SODIUM CHLORIDE 0.9 % IV BOLUS (SEPSIS)
1000.0000 mL | Freq: Once | INTRAVENOUS | Status: AC
  Administered 2014-04-23: 1000 mL via INTRAVENOUS

## 2014-04-23 MED ORDER — DEXTROSE 5 % IV SOLN
100.0000 mg | Freq: Once | INTRAVENOUS | Status: AC
  Administered 2014-04-23: 100 mg via INTRAVENOUS
  Filled 2014-04-23: qty 100

## 2014-04-23 MED ORDER — OXYCODONE-ACETAMINOPHEN 5-325 MG PO TABS: 1.0000 | ORAL_TABLET | ORAL | Status: DC | PRN

## 2014-04-23 NOTE — ED Notes (Signed)
Pt placed on continuous pulse oximetry monitoring at this time

## 2014-04-23 NOTE — ED Provider Notes (Signed)
CSN: [id number]     Arrival date & time 04/23/14  1442 History   First MD Initiated Contact with Patient 04/23/14 1841     Chief Complaint  Patient presents with  . Facial Pain     (Consider location/radiation/quality/duration/timing/severity/associated sxs/prior Treatment) Patient is a 33 y.o. male presenting with abscess. The history is provided by the patient. No language interpreter was used.  Abscess Location:  Face Facial abscess location:  Nose Associated symptoms: no fever and no nausea   Associated symptoms comment:  Painful swelling that began inside left nostril several days ago and has now progressed to the upper lip. He reports some draining from the left nostril intermittently. Today he started feeling worse today and running a temperature. No nausea or vomiting.   Past Medical History  Diagnosis Date  . Migraines   . Seizures    History reviewed. No pertinent past surgical history. No family history on file. History  Substance Use Topics  . Smoking status: Current Every Day Smoker    Types: Cigarettes  . Smokeless tobacco: Not on file  . Alcohol Use: Yes    Review of Systems  Constitutional: Negative for fever and chills.  HENT: Positive for facial swelling.   Respiratory: Negative.   Cardiovascular: Negative.   Gastrointestinal: Negative.  Negative for nausea.  Skin: Positive for color change.      Allergies  Review of patient's allergies indicates no known allergies.  Home Medications   Prior to Admission medications   Medication Sig Start Date End Date Taking? Authorizing Provider  Aspirin-Salicylamide-Caffeine (BC HEADACHE POWDER PO) Take 1 packet by mouth daily as needed (pain).   Yes Historical Provider, MD   BP 150/94  Pulse 96  Temp(Src) 100.5 F (38.1 C) (Oral)  Resp 16  Wt 195 lb (88.451 kg)  SpO2 96% Physical Exam  Constitutional: He is oriented to person, place, and time. He appears well-developed and well-nourished. No distress.   HENT:  Pointing abscess in left nostril with redness and swelling extending to upper lip. No dental swelling or abnormality.   Eyes: Conjunctivae are normal.  Neck: Normal range of motion.  Pulmonary/Chest: Effort normal.  Neurological: He is alert and oriented to person, place, and time.  Skin: There is erythema.  Psychiatric: He has a normal mood and affect.    ED Course  Procedures (including critical care time) Labs Review Labs Reviewed  CBC WITH DIFFERENTIAL - Abnormal; Notable for the following:    WBC 16.0 (*)    RDW 15.8 (*)    Neutrophils Relative % 78 (*)    Neutro Abs 12.4 (*)    All other components within normal limits  BASIC METABOLIC PANEL - Abnormal; Notable for the following:    GFR calc non Af Amer 88 (*)    All other components within normal limits    Imaging Review No results found.   EKG Interpretation None      MDM   Final diagnoses:  None    1. Facial abscess with cellulitis  IV abx started in ED. He is more comfortable, is afebrile and appropriate for outpatient management. Close recheck recommended.     Arnoldo HookerShari A Shanan Mcmiller, PA-C 04/23/14 2104

## 2014-04-23 NOTE — Discharge Instructions (Signed)
Abscess °An abscess is an infected area that contains a collection of pus and debris. It can occur in almost any part of the body. An abscess is also known as a furuncle or boil. °CAUSES  °An abscess occurs when tissue gets infected. This can occur from blockage of oil or sweat glands, infection of hair follicles, or a minor injury to the skin. As the body tries to fight the infection, pus collects in the area and creates pressure under the skin. This pressure causes pain. People with weakened immune systems have difficulty fighting infections and get certain abscesses more often.  °SYMPTOMS °Usually an abscess develops on the skin and becomes a painful mass that is red, warm, and tender. If the abscess forms under the skin, you may feel a moveable soft area under the skin. Some abscesses break open (rupture) on their own, but most will continue to get worse without care. The infection can spread deeper into the body and eventually into the bloodstream, causing you to feel ill.  °DIAGNOSIS  °Your caregiver will take your medical history and perform a physical exam. A sample of fluid may also be taken from the abscess to determine what is causing your infection. °TREATMENT  °Your caregiver may prescribe antibiotic medicines to fight the infection. However, taking antibiotics alone usually does not cure an abscess. Your caregiver may need to make a small cut (incision) in the abscess to drain the pus. In some cases, gauze is packed into the abscess to reduce pain and to continue draining the area. °HOME CARE INSTRUCTIONS  °· Only take over-the-counter or prescription medicines for pain, discomfort, or fever as directed by your caregiver. °· If you were prescribed antibiotics, take them as directed. Finish them even if you start to feel better. °· If gauze is used, follow your caregiver's directions for changing the gauze. °· To avoid spreading the infection: °· Keep your draining abscess covered with a  bandage. °· Wash your hands well. °· Do not share personal care items, towels, or whirlpools with others. °· Avoid skin contact with others. °· Keep your skin and clothes clean around the abscess. °· Keep all follow-up appointments as directed by your caregiver. °SEEK MEDICAL CARE IF:  °· You have increased pain, swelling, redness, fluid drainage, or bleeding. °· You have muscle aches, chills, or a general ill feeling. °· You have a fever. °MAKE SURE YOU:  °· Understand these instructions. °· Will watch your condition. °· Will get help right away if you are not doing well or get worse. °Document Released: 09/20/2005 Document Revised: 06/11/2012 Document Reviewed: 02/23/2012 °ExitCare® Patient Information ©2014 [employer] LLC. ° °Cellulitis °Cellulitis is an infection of the skin and the tissue beneath it. The infected area is usually red and tender. Cellulitis occurs most often in the arms and lower legs.  °CAUSES  °Cellulitis is caused by bacteria that enter the skin through cracks or cuts in the skin. The most common types of bacteria that cause cellulitis are Staphylococcus and Streptococcus. °SYMPTOMS  °· Redness and warmth. °· Swelling. °· Tenderness or pain. °· Fever. °DIAGNOSIS  °Your caregiver can usually determine what is wrong based on a physical exam. Blood tests may also be done. °TREATMENT  °Treatment usually involves taking an antibiotic medicine. °HOME CARE INSTRUCTIONS  °· Take your antibiotics as directed. Finish them even if you start to feel better. °· Keep the infected arm or leg elevated to reduce swelling. °· Apply a warm cloth to the affected area up to   4 times per day to relieve pain.  Only take over-the-counter or prescription medicines for pain, discomfort, or fever as directed by your caregiver.  Keep all follow-up appointments as directed by your caregiver. SEEK MEDICAL CARE IF:   You notice red streaks coming from the infected area.  Your red area gets larger or turns dark in  color.  Your bone or joint underneath the infected area becomes painful after the skin has healed.  Your infection returns in the same area or another area.  You notice a swollen bump in the infected area.  You develop new symptoms. SEEK IMMEDIATE MEDICAL CARE IF:   You have a fever.  You feel very sleepy.  You develop vomiting or diarrhea.  You have a general ill feeling (malaise) with muscle aches and pains. MAKE SURE YOU:   Understand these instructions.  Will watch your condition.  Will get help right away if you are not doing well or get worse. Document Released: 09/20/2005 Document Revised: 06/11/2012 Document Reviewed: 02/26/2012 [facility]Exit[facility] Patient Information 2014 [place]haynExitCare, MarylandLLC. Heat Therapy Heat therapy can help ease achy, tense, stiff, and tight muscles and joints. Heat should not be used on new injuries. Wait at least 48 hours after the injury before using heat therapy. Heat also should not be used for discomfort or pain that occurs right after doing an activity. If you still have pain or stiffness 3 hours after finishing the activity, then heat therapy may be used. PRECAUTIONS  High heat or prolonged exposure to heat can cause burns. Be careful when using heat therapy to avoid burning your skin. If you have any of the following conditions, do not use heat until you have discussed heat therapy with your caregiver:  Poor circulation.  Healing wounds or scarred skin in the area being treated.  Diabetes, heart disease, or high blood pressure.  Numbness of the area being treated.  Unusual swelling of the area being treated.  Active infections.  Blood clots.  Cancer.  Inability to communicate your response to pain. This can include young children and people with dementia. HOME CARE INSTRUCTIONS Moist heat pack  Soak a clean towel in warm water, and squeeze out the extra water. The water temperature should be comfortable to the skin.  Put the warm, wet towel  in a plastic bag.  Place a thin, dry towel between your skin and the bag.  Put the heat pack on the area for 5 minutes, and check your skin. Your skin may be pink, but it should not be red.  Leave the heat pack on the area for a total of 15 to 30 minutes.  Repeat this every 2 to 4 hours while awake. Do not use heat while you are sleeping. Warm water bath  Fill a tub with warm water. The water temperature should be comfortable to the skin.  Place the affected body part in the tub.  Soak the area for 20 to 40 minutes.  Repeat as needed. Hot water bottle  Fill the water bottle half full with hot water.  Press out the extra air. Close the cap tightly.  Place a dry towel between your skin and the bottle.  Put the bottle on the area for 5 minutes, and check your skin. Your skin may be pink, but it should not be red.  Leave the bottle on the area for a total of 15 to 30 minutes.  Repeat this every 2 to 4 hours while awake. [employer]er[employer] heating pad  Place  a dry towel between your skin and the heating pad.  Set the heating pad on low heat.  Put the heating pad on the area for 10 minutes, and check your skin. Your skin may be pink, but it should not be red.  Leave the heating pad on the area for a total of 20 to 40 minutes.  Repeat this every 2 to 4 hours while awake.  Do not lie on the heating pad.  Do not fall asleep while using the heating pad.  Do not use the heating pad near water. Contact with water can result in an electrical shock. SEEK MEDICAL CARE IF:  You have blisters, redness, swelling, or numbness.  You have any new problems.  Your problems are getting worse.  You have any questions or concerns. If you develop any problems, stop using heat therapy until you see your caregiver. MAKE SURE YOU:  Understand these instructions.  Will watch your condition.  Will get help right away if you are not doing well or get worse. Document Released: 03/04/2012  Document Reviewed: 03/04/2012 [facility] LLCExitCare Patient Information 2014 [place]ExitCare, MarylandLLC.

## 2014-04-23 NOTE — ED Notes (Addendum)
Facial pain and swelling in mouth  that started  2-3 days ago states thinks that it started w/ bump in nose denies putting anything in nose ,breathing diff or difficulty swallowing denies injury or punch to face or nose

## 2014-04-24 NOTE — ED Provider Notes (Signed)
Medical screening examination/treatment/procedure(s) were performed by non-physician practitioner and as supervising physician I was immediately available for consultation/collaboration.  Flint MelterElliott L Trany Chernick, MD 04/24/14 [phone]

## 2014-07-21 ENCOUNTER — Ambulatory Visit (INDEPENDENT_AMBULATORY_CARE_PROVIDER_SITE_OTHER): Payer: BC Managed Care – PPO | Admitting: Family Medicine

## 2014-07-21 VITALS — BP 130/88 | HR 83 | Temp 97.5°F | Ht 72.6 in | Wt 190.4 lb

## 2014-07-21 DIAGNOSIS — L0293 Carbuncle, unspecified: Secondary | ICD-10-CM | POA: Insufficient documentation

## 2014-07-21 DIAGNOSIS — R21 Rash and other nonspecific skin eruption: Secondary | ICD-10-CM

## 2014-07-21 DIAGNOSIS — B370 Candidal stomatitis: Secondary | ICD-10-CM | POA: Insufficient documentation

## 2014-07-21 DIAGNOSIS — L738 Other specified follicular disorders: Secondary | ICD-10-CM

## 2014-07-21 DIAGNOSIS — L0292 Furuncle, unspecified: Secondary | ICD-10-CM

## 2014-07-21 DIAGNOSIS — L739 Follicular disorder, unspecified: Secondary | ICD-10-CM

## 2014-07-21 DIAGNOSIS — Z8614 Personal history of Methicillin resistant Staphylococcus aureus infection: Secondary | ICD-10-CM

## 2014-07-21 LAB — POCT CBC
Granulocyte percent: 70.6 %G (ref 37–80)
HEMATOCRIT: 48.6 % (ref 43.5–53.7)
HEMOGLOBIN: 15.2 g/dL (ref 14.1–18.1)
LYMPH, POC: 2.7 (ref 0.6–3.4)
MCH: 28.3 pg (ref 27–31.2)
MCHC: 31.4 g/dL — AB (ref 31.8–35.4)
MCV: 90.2 fL (ref 80–97)
MID (cbc): 0.6 (ref 0–0.9)
MPV: 8.6 fL (ref 0–99.8)
POC Granulocyte: 7.8 — AB (ref 2–6.9)
POC LYMPH PERCENT: 24 %L (ref 10–50)
POC MID %: 5.4 % (ref 0–12)
Platelet Count, POC: 269 10*3/uL (ref 142–424)
RBC: 5.39 M/uL (ref 4.69–6.13)
RDW, POC: 17.7 %
WBC: 11.1 10*3/uL — AB (ref 4.6–10.2)

## 2014-07-21 LAB — POCT SEDIMENTATION RATE: POCT SED RATE: 20 mm/hr (ref 0–22)

## 2014-07-21 MED ORDER — FLUCONAZOLE 100 MG PO TABS: 100.0000 mg | ORAL_TABLET | Freq: Once | ORAL | Status: DC

## 2014-07-21 MED ORDER — DOXYCYCLINE HYCLATE 100 MG PO CAPS: 100.0000 mg | ORAL_CAPSULE | Freq: Two times a day (BID) | ORAL | Status: DC

## 2014-07-21 MED ORDER — HYDROXYZINE HCL 25 MG PO TABS: 12.5000 mg | ORAL_TABLET | Freq: Three times a day (TID) | ORAL | Status: DC | PRN

## 2014-07-21 MED ORDER — TRIAMCINOLONE ACETONIDE 0.1 % EX CREA: 1.0000 "application " | TOPICAL_CREAM | Freq: Two times a day (BID) | CUTANEOUS | Status: DC

## 2014-07-21 MED ORDER — MUPIROCIN 2 % EX OINT: 1.0000 "application " | TOPICAL_OINTMENT | Freq: Three times a day (TID) | CUTANEOUS | Status: DC

## 2014-07-21 NOTE — Assessment & Plan Note (Signed)
Etiology not entirely clear by hx or exam, but suspect at least some allergic component, possibly to neomycin. Doubt infectious etiology of RLE rash as no warmth or tenderness.  Plan -stop antibiotic ointment and all other OTC topicals -rx steroid ointment for BID use -shave biopsy performed today -check CBC, HIV, CRP, ESR today -follow up in 1 week to ensure is improving. -See other problems for additional dermatologic issues.

## 2014-07-21 NOTE — Patient Instructions (Addendum)
Stop the neosporin and other over the counter medicines, ointments, lotions. Keep the biopsy site clean with soap and water once a day. Don't apply the steroid ointment to the biopsy site. Apply the steroid ointment twice a day needed to other rash.  Can use topical vaseline or eucerin (fragrance and color free hypoallergenic lotion) Use hydroxyzine as needed for itching (be careful because it might make you sleepy). Take doxycycline twice a day for 10 days.  Also take diflucan 2 pills today, then 1 pill daily for 2 weeks. Follow up in 1 week - this is very important. You can apply the topical antibiotic mupirocin (bactroban) to the other bumps/spots three times a day after a warm compress   Thrush, Adult  Thrush, also called oral candidiasis, is a fungal infection that develops in the mouth and throat and on the tongue. It causes white patches to form on the mouth and tongue. Ritta Slot is most common in older adults, but it can occur at any age.  Many cases of thrush are mild, but this infection can also be more serious. Ritta Slot can be a recurring problem for people who have chronic illnesses or who take medicines that limit the body's ability to fight infection. Because these people have difficulty fighting infections, the fungus that causes thrush can spread throughout the body. This can cause life-threatening blood or organ infections. CAUSES  Ritta Slot is usually caused by a yeast called Candida albicans. This fungus is normally present in small amounts in the mouth and on other mucous membranes. It usually causes no harm. However, when conditions are present that allow the fungus to grow uncontrolled, it invades surrounding tissues and becomes an infection. Less often, other Candida species can also lead to thrush.  RISK FACTORS Ritta Slot is more likely to develop in the following people:  People with an impaired ability to fight infection (weakened immune system).   Older adults.   People with  HIV.   People with diabetes.   People with dry mouth (xerostomia).   Pregnant women.   People with poor dental care, especially those who have false teeth.   People who use antibiotic medicines.  SIGNS AND SYMPTOMS  Ritta Slot can be a mild infection that causes no symptoms. If symptoms develop, they may include:   A burning feeling in the mouth and throat. This can occur at the start of a thrush infection.   White patches that adhere to the mouth and tongue. The tissue around the patches may be red, raw, and painful. If rubbed (during tooth brushing, for example), the patches and the tissue of the mouth may bleed easily.   A bad taste in the mouth or difficulty tasting foods.   Cottony feeling in the mouth.   Pain during eating and swallowing. DIAGNOSIS  Your health care provider can usually diagnose thrush by looking in your mouth and asking you questions about your health.  TREATMENT  Medicines that help prevent the growth of fungi (antifungals) are the standard treatment for thrush. These medicines are either applied directly to the affected area (topical) or swallowed (oral). The treatment will depend on the severity of the condition.  Mild Thrush Mild cases of thrush may clear up with the use of an antifungal mouth rinse or lozenges. Treatment usually lasts about 14 days.  Moderate to Severe Thrush  More severe thrush infections that have spread to the esophagus are treated with an oral antifungal medicine. A topical antifungal medicine may also be used.  For some severe infections, a treatment period longer than 14 days may be needed.   Oral antifungal medicines are almost never used during pregnancy because the fetus may be harmed. However, if a pregnant woman has a rare, severe thrush infection that has spread to her blood, oral antifungal medicines may be used. In this case, the risk of harm to the mother and fetus from the severe thrush infection may be greater  than the risk posed by the use of antifungal medicines.  Persistent or Recurrent Thrush For cases of thrush that do not go away or keep coming back, treatment may involve the following:   Treatment may be needed twice as long as the symptoms last.   Treatment will include both oral and topical antifungal medicines.   People with weakened immune systems can take an antifungal medicine on a continuous basis to prevent thrush infections.  It is important to treat conditions that make you more likely to get thrush, such as diabetes or HIV.  HOME CARE INSTRUCTIONS   Only take over-the-counter or prescription medicine as directed by your health care provider. Talk to your health care provider about an over-the-counter medicine called gentian violet, which kills bacteria and fungi.   Eat plain, unflavored yogurt as directed by your health care provider. Check the label to make sure the yogurt contains live cultures. This yogurt can help healthy bacteria grow in the mouth that can stop the growth of the fungus that causes thrush.   Try these measures to help reduce the discomfort of thrush:   Drink cold liquids such as water or iced tea.   Try flavored ice treats or frozen juices.   Eat foods that are easy to swallow, such as gelatin, ice cream, or custard.   If the patches in your mouth are painful, try drinking from a straw.   Rinse your mouth several times a day with a warm saltwater rinse. You can make the saltwater mixture with 1 tsp (6 g) of salt in 8 fl oz (0.2 L) of warm water.   If you wear dentures, remove the dentures before going to bed, brush them vigorously, and soak them in a cleaning solution as directed by your health care provider.   Women who are breastfeeding should clean their nipples with an antifungal medicine as directed by their health care provider. Dry the nipples after breastfeeding. Applying lanolin-containing body lotion may help relieve nipple  soreness.  SEEK MEDICAL CARE IF:  Your symptoms are getting worse or are not improving within 7 days of starting treatment.   You have symptoms of spreading infection, such as white patches on the skin outside of the mouth.   You are nursing and you have redness, burning, or pain in the nipples that is not relieved with treatment.  MAKE SURE YOU:  Understand these instructions.  Will watch your condition.  Will get help right away if you are not doing well or get worse. Document Released: 09/05/2004 Document Revised: 10/01/2013 Document Reviewed: 07/14/2013 [facility] Patient Information 2015 [place], [place]. This information is not intended to replace advice given to you by your health care provider. Make sure you discuss any questions you have with your health care provider.  Drug Allergy Allergic reactions to medicines are common. Some allergic reactions are mild. A delayed type of drug allergy that occurs 1 week or more after exposure to a medicine or vaccine is called serum sickness. A life-threatening, sudden (acute) allergic reaction that involves the whole body  is called anaphylaxis. CAUSES  "True" drug allergies occur when there is an allergic reaction to a medicine. This is caused by overactivity of the immune system. First, the body becomes sensitized. The immune system is triggered by your first exposure to the medicine. Following this first exposure, future exposure to the same medicine may be life-threatening. Almost any medicine can cause an allergic reaction. Common ones are:  Penicillin.  Sulfonamides (sulfa drugs).  Local anesthetics.  X-ray dyes that contain iodine. SYMPTOMS  Common symptoms of a minor allergic reaction are:  Swelling around the mouth.  An itchy red rash or hives.  Vomiting or diarrhea. Anaphylaxis can cause swelling of the mouth and throat. This makes it difficult to breathe and swallow. Severe reactions can be fatal within seconds, even  after exposure to only a trace amount of the drug that causes the reaction. HOME CARE INSTRUCTIONS   If you are unsure of what caused your reaction, keep a diary of foods and medicines used. Include the symptoms that followed. Avoid anything that causes reactions.  You may want to follow up with an allergy specialist after the reaction has cleared in order to be tested to confirm the allergy. It is important to confirm that your reaction is an allergy, not just a side effect to the medicine. If you have a true allergy to a medicine, this may prevent that medicine and related medicines from being given to you when you are very ill.  If you have hives or a rash:  Take medicines as directed by your caregiver.  You may use an over-the-counter antihistamine (diphenhydramine) as needed.  Apply cold compresses to the skin or take baths in cool water. Avoid hot baths or showers.  If you are severely allergic:  Continuous observation after a severe reaction may be needed. Hospitalization is often required.  Wear a medical alert bracelet or necklace stating your allergy.  You and your family must learn how to use an anaphylaxis kit or give an epinephrine injection to temporarily treat an emergency allergic reaction. If you have had a severe reaction, always carry your epinephrine injection or anaphylaxis kit with you. This can be lifesaving if you have a severe reaction.  Do not drive or perform tasks after treatment until the medicines used to treat your reaction have worn off, or until your caregiver says it is okay. SEEK MEDICAL CARE IF:   You think you had an allergic reaction. Symptoms usually start within 30 minutes after exposure.  Symptoms are getting worse rather than better.  You develop new symptoms.  The symptoms that brought you to your caregiver return. SEEK IMMEDIATE MEDICAL CARE IF:   You have swelling of the mouth, difficulty breathing, or wheezing.  You have a tight  feeling in your chest or throat.  You develop hives, swelling, or itching all over your body.  You develop severe vomiting or diarrhea.  You feel faint or pass out. This is an emergency. Use your epinephrine injection or anaphylaxis kit as you have been instructed. Call for emergency medical help. Even if you improve after the injection, you need to be examined at a hospital emergency department. MAKE SURE YOU:   Understand these instructions.  Will watch your condition.  Will get help right away if you are not doing well or get worse. Document Released: 12/11/2005 Document Revised: 03/04/2012 Document Reviewed: 05/17/2011 [facility] Patient Information 2015 [place], [place]. This information is not intended to replace advice given to you by your health  care provider. Make sure you discuss any questions you have with your health care provider.

## 2014-07-21 NOTE — Progress Notes (Signed)
Patient ID: Randy [name]., male   DOB: 1981-08-31, 33 y.o.   MRN: [id number]  HPI:  Pt presents for a same day appointment to discuss rash.  He was admitted to the hospital with aseptic meningitis in January. Had a positive RPR at that time, negative HIV. Follow up treponemal studies were negative.  Had rash on medial aspect of R ankle in February. Was seen here at [facility] and treated with doxycycline. Had elevated CRP, normal sed rate, leukocytosis at that time. The rash improved briefly while treated with antibiotics. Xray was negative for any acute changes of ankle. [facility] called pt and sent letter but he is just now following up.  He was seen in the ER for an abscess of his nose in April, treated again with doxycycline. Rash improved at that time as well.  In the interim he has been developing boils over various parts of his body (axillae, face, leg) which eventually rupture and rain pus. The rash on his R leg has worsened over the last few weeks, prompting him to return today. He has been applying over the counter topical antibiotic ointment which contains neomycin, has also applied topical goldbond itch medicine. The rash is intensely pruritic and when it becomes dry is painful.   He denies any fevers, weight loss, cough, breathing problems, sores in his genital area/mouth, contacts with similar symptoms. No new medicines or foods.  ROS: See HPI  [place]: hx aseptic meningitis in Jan 2015  PHYSICAL EXAM: BP 130/88  Pulse 83  Temp(Src) 97.5 F (36.4 C) (Oral)  Ht 6' 0.6" (1.844 m)  Wt 190 lb 6.4 oz (86.365 kg)  BMI 25.40 kg/m2  SpO2 98% Gen: NAD HEENT: NCAT, MMM, whitish material on tongue and posterior oropharynx which did not scrape off. No anterior cervical LAD.  Heart: RRR Lungs: CTAB, NWOB Neuro: grossly nonfocal, speech normal Skin: erythematous maculopapular rash over much of RLE, especially on medial aspect. No warmth or tenderness to palpation over the rash. Excoriations  present from scratching. Mild version of same rash on L medial shin. Chronic scarring present over medial R ankle. Ext: rash as above. 2+ DP pulses bilaterally. Full ROM of R ankle with dorsiflexion, plantarflexion, inversion, eversion. Sensation intact to distal toes.  PROCEDURE NOTE: Shave Biopsy: R medial shin The skin was prepped with alcohol and 1% lidocaine was injected for local anesthesia. The skin was then prepped with betadine x 2. A shave biopsy into the dermis was performed with a razor and hemostasis was achieved with silver nitrate. The biopsy specimen was submitted to pathology in formalin. A dressing was applied. Wound instructions were given. There were no complications. The patient tolerated the procedure well.   ASSESSMENT/PLAN:  Rash and nonspecific skin eruption Etiology not entirely clear by hx or exam, but suspect at least some allergic component, possibly to neomycin. Doubt infectious etiology of RLE rash as no warmth or tenderness.  Plan -stop antibiotic ointment and all other OTC topicals -rx steroid ointment for BID use -shave biopsy performed today -check CBC, HIV, CRP, ESR today -follow up in 1 week to ensure is improving. -See other problems for additional dermatologic issues.  Recurrent boils Tested positive for MRSA during hospitalization in January 2015. 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 Plan -doxycycline PO therapy -consider bactroban and hibiclens in future (hold off for now given rash of RLE) -check CBC, HIV, CRP, ESR today -follow up in 1 week to ensure is improving.  Thrush Noted on exam today. Concern  for immunocompromise given additional problems today (chronic rash, recurrent boils). Plan: -diflucan 26m today then 1011mdaily x 2 weeks -check CBC, HIV, CRP, ESR today -follow up in 1 week to ensure is improving.   FOLLOW UP: F/u in 1 week for further evaluation.  BrMadisonvilleMcArdelia MemsMD Family Medicine PGY-3

## 2014-07-21 NOTE — Assessment & Plan Note (Addendum)
Noted on exam today. Concern for immunocompromise given additional problems today (chronic rash, recurrent boils). Plan: -diflucan 230m today then 1046mdaily x 2 weeks -check CBC, HIV, CRP, ESR today -follow up in 1 week to ensure is improving.

## 2014-07-21 NOTE — Progress Notes (Signed)
Results for orders placed in visit on 07/21/14  POCT CBC      Result Value Ref Range   WBC 11.1 (*) 4.6 - 10.2 K/uL   Lymph, poc 2.7  0.6 - 3.4   POC LYMPH PERCENT 24.0  10 - 50 %L   MID (cbc) 0.6  0 - 0.9   POC MID % 5.4  0 - 12 %M   POC Granulocyte 7.8 (*) 2 - 6.9   Granulocyte percent 70.6  37 - 80 %G   RBC 5.39  4.69 - 6.13 M/uL   Hemoglobin 15.2  14.1 - 18.1 g/dL   HCT, POC 16.1  09.6 - 53.7 %   MCV 90.2  80 - 97 fL   MCH, POC 28.3  27 - 31.2 pg   MCHC 31.4 (*) 31.8 - 35.4 g/dL   RDW, POC 04.5     Platelet Count, POC 269  142 - 424 K/uL   MPV 8.6  0 - 99.8 fL  POCT SEDIMENTATION RATE      Result Value Ref Range   POCT SED RATE 20  0 - 22 mm/hr     Rash and nonspecific skin eruption - Plan: POCT CBC, POCT SEDIMENTATION RATE, C-reactive protein, HIV antibody, Dermatology pathology - I strongly suspect that this is some sort of contact dermatitis - poss to the topical neosporin he has been using - but complicated by his h/o sig improvement while on doxy. shave biopsy done today,  And start top TAC on other areas - rec f/u w/ me until completely cleared - recheck in 1 wk  Thrush - Plan: POCT CBC, POCT SEDIMENTATION RATE, C-reactive protein, HIV antibody - diflucan qd x 1 mo  Folliculitis - Plan: POCT CBC, POCT SEDIMENTATION RATE, C-reactive protein, HIV antibody - poss colonozed w/ MRSA as has lesion in hair, cheek, bilateral axilla, buttocks, etc - doxy bid x 10d  Hx MRSA infection - Plan: POCT CBC, POCT SEDIMENTATION RATE, C-reactive protein, HIV antibody, Dermatology pathology  Recurrent boils - may want to consider using hibiclens in future, rec putting bactroban onto nasal mucosa bid x 5d  Meds ordered this encounter  Medications  . DISCONTD: doxycycline (VIBRAMYCIN) 100 MG capsule    Sig: Take 1 capsule (100 mg total) by mouth 2 (two) times daily.    Dispense:  20 capsule    Refill:  0  . fluconazole (DIFLUCAN) 100 MG tablet    Sig: Take 1 tablet (100 mg total) by  mouth once. Take 2 tabs po x 1, then start 1 tab po qd    Dispense:  31 tablet    Refill:  0  . mupirocin ointment (BACTROBAN) 2 %    Sig: Apply 1 application topically 3 (three) times daily.    Dispense:  30 g    Refill:  1  . triamcinolone cream (KENALOG) 0.1 %    Sig: Apply 1 application topically 2 (two) times daily.    Dispense:  30 g    Refill:  0  . hydrOXYzine (ATARAX/VISTARIL) 25 MG tablet    Sig: Take 0.5-1 tablets (12.5-25 mg total) by mouth every 8 (eight) hours as needed for itching.    Dispense:  30 tablet    Refill:  0  . doxycycline (VIBRAMYCIN) 100 MG capsule    Sig: Take 1 capsule (100 mg total) by mouth 2 (two) times daily.    Dispense:  20 capsule    Refill:  0    Pt assessed,  reviewed documentation and agree w/ assessment and plan. Randy SorensonEva Grace Haggart, MD MPH

## 2014-07-21 NOTE — Assessment & Plan Note (Signed)
Tested positive for MRSA during hospitalization in January 2015. 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 Plan -doxycycline PO therapy -consider bactroban and hibiclens in future (hold off for now given rash of RLE) -check CBC, HIV, CRP, ESR today -follow up in 1 week to ensure is improving.

## 2014-07-22 ENCOUNTER — Encounter: Payer: Self-pay | Admitting: Family Medicine

## 2014-07-22 LAB — C-REACTIVE PROTEIN: CRP: 0.7 mg/dL — ABNORMAL HIGH (ref <0.60)

## 2014-07-22 LAB — HIV ANTIBODY (ROUTINE TESTING W REFLEX): HIV: NONREACTIVE

## 2015-10-26 IMAGING — CT CT HEAD W/O CM
1 series · 16 of 30 positions shown, 20 images · non-contrast
Comparison: None available for comparison at time of study
interpretation.

CLINICAL DATA: Headache, photophobia.

EXAM:
CT HEAD WITHOUT CONTRAST
TECHNIQUE: Contiguous axial images were obtained from the base of the skull
through the vertex without intravenous contrast.

[Series 2: head 5.0 h30s · axial · 0.46mm/px · z∈[-73,+62]mm · 16 of 30 slices shown, 20 images]
[im 2/30  brain]
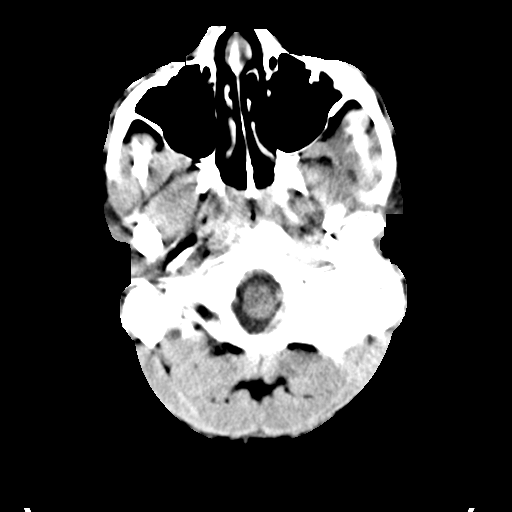
[im 2/30  bone]
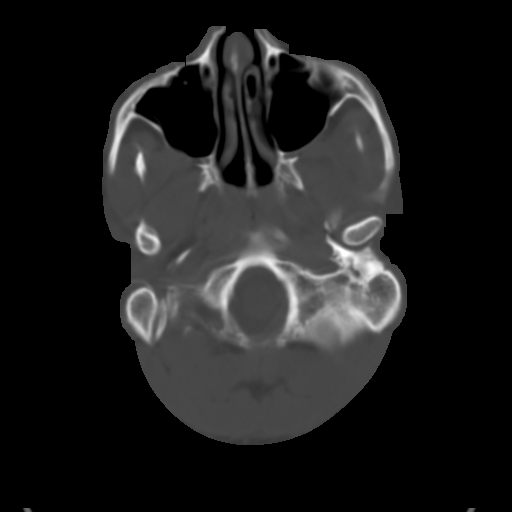
[im 4/30  brain]
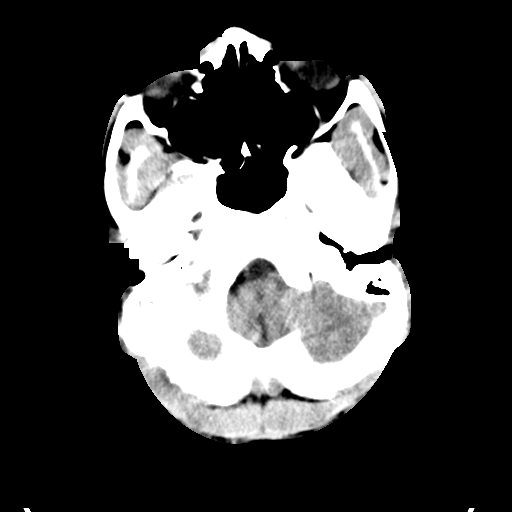
[im 6/30  brain]
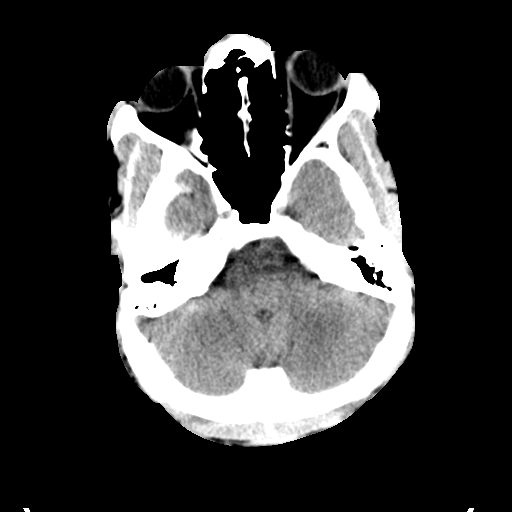
[im 8/30  brain]
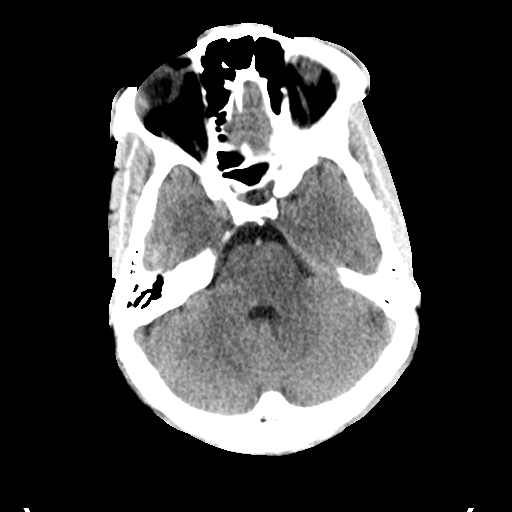
[im 9/30  brain]
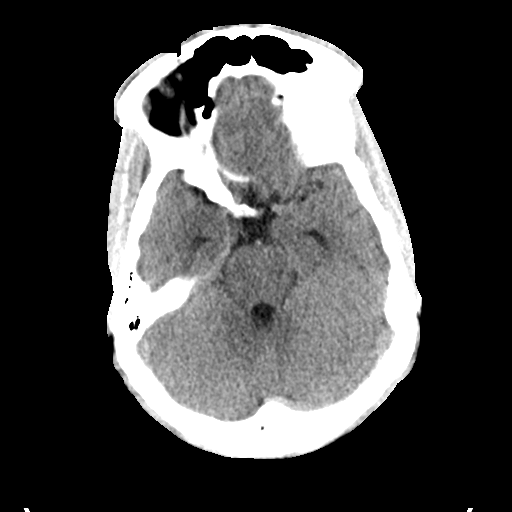
[im 9/30  bone]
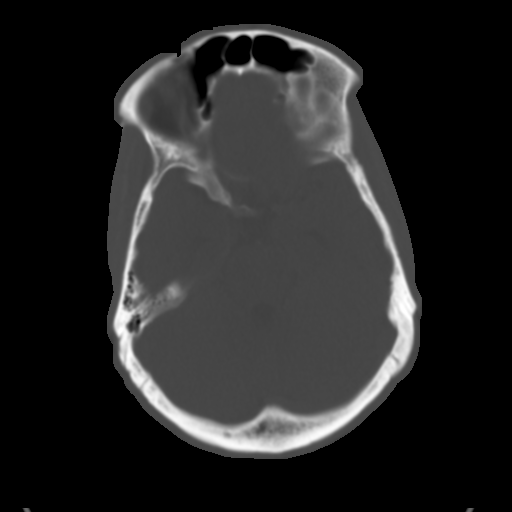
[im 11/30  brain]
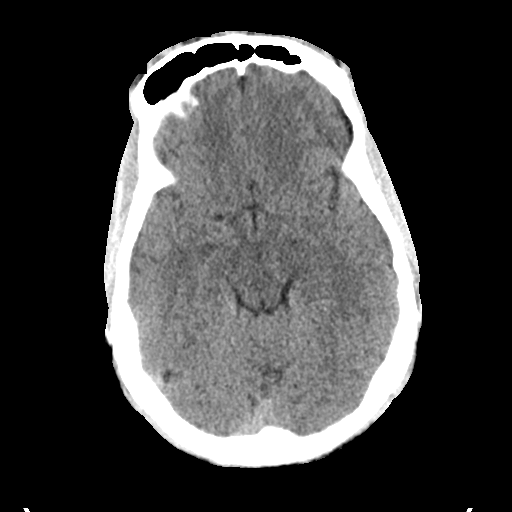
[im 13/30  brain]
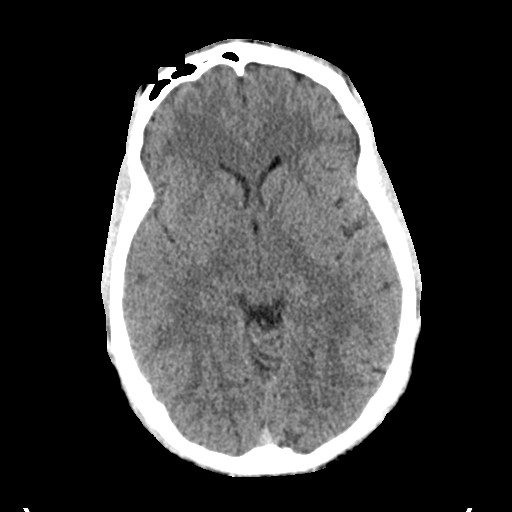
[im 15/30  brain]
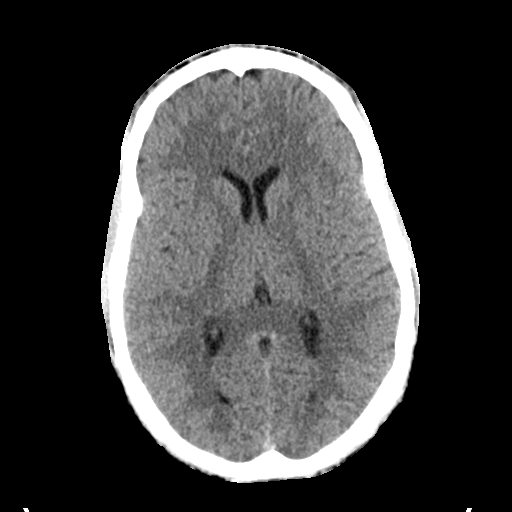
[im 16/30  brain]
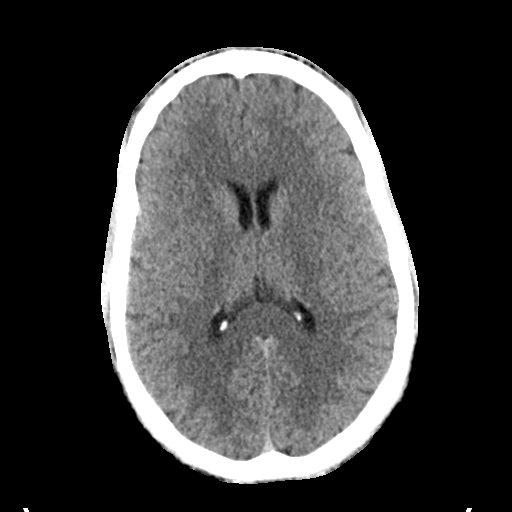
[im 16/30  bone]
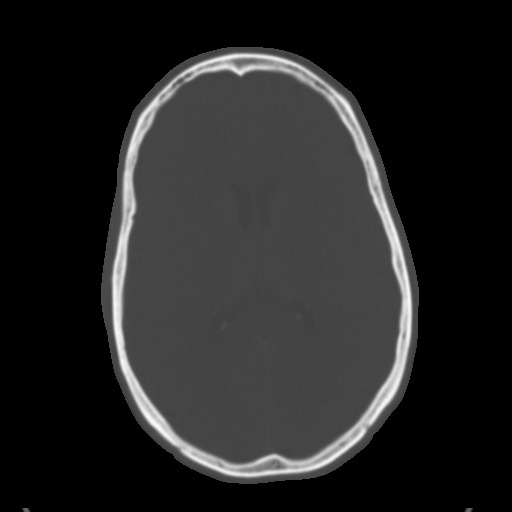
[im 18/30  brain]
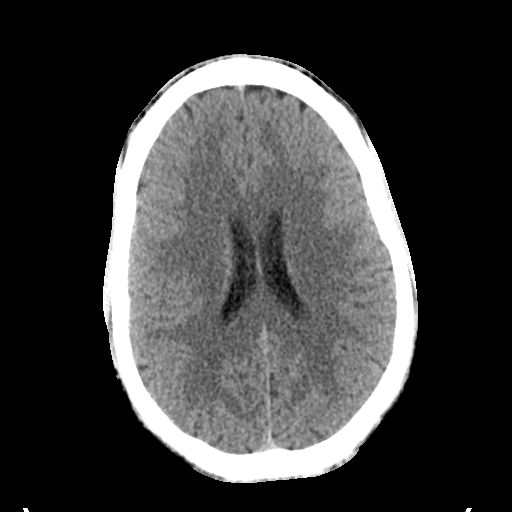
[im 20/30  brain]
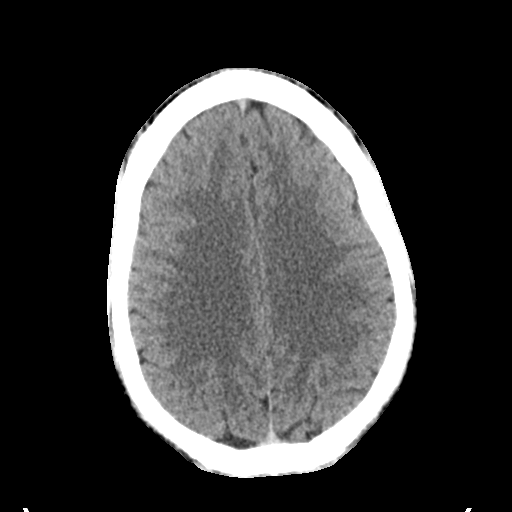
[im 22/30  brain]
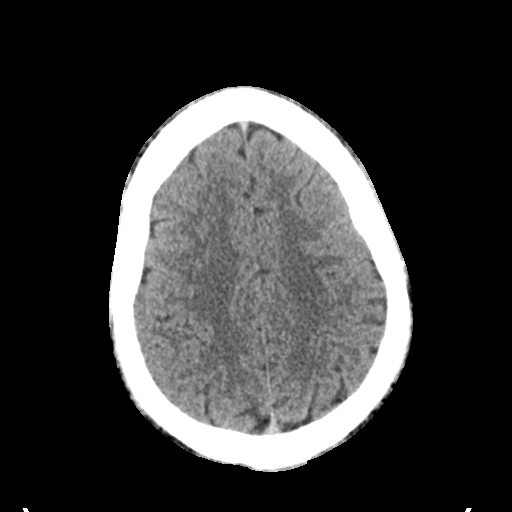
[im 23/30  brain]
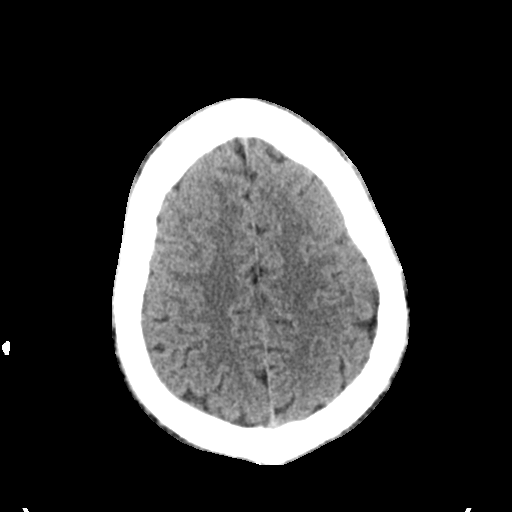
[im 23/30  bone]
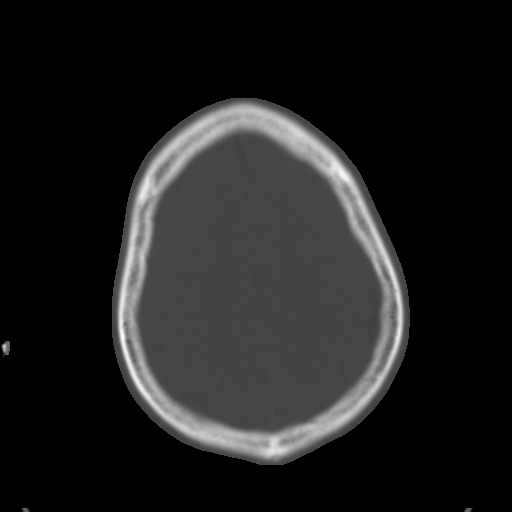
[im 25/30  brain]
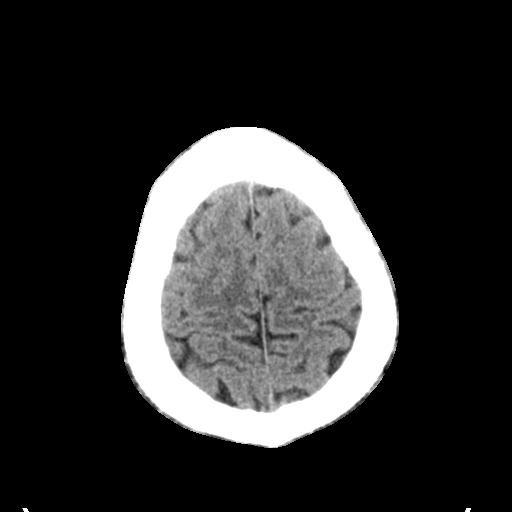
[im 27/30  brain]
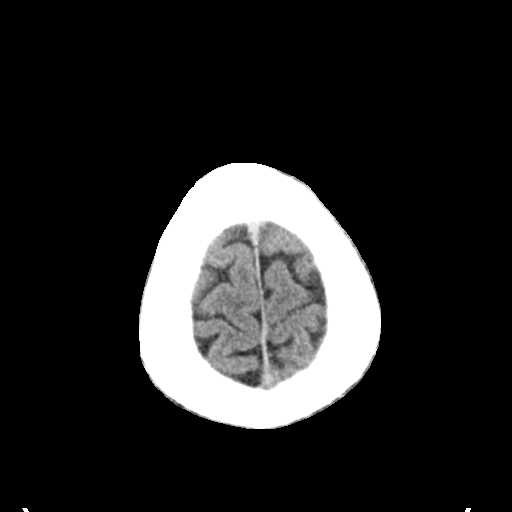
[im 29/30  brain]
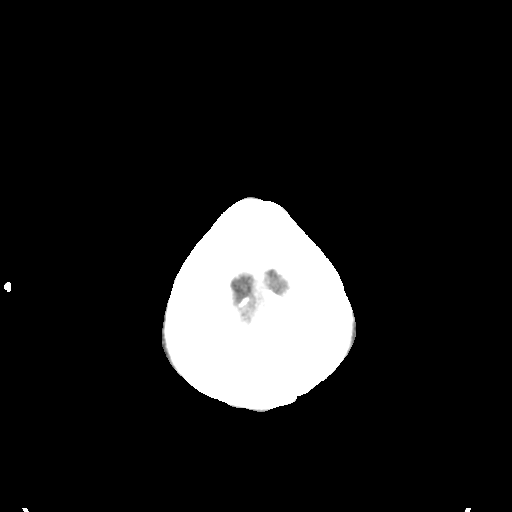

[16 of 30 positions shown; findings below may reference images not displayed]

FINDINGS: Motion degraded evaluation of the skullbase. The ventricles and
sulci are normal. No intraparenchymal hemorrhage, mass effect nor
midline shift. No acute large vascular territory infarcts.

No abnormal extra-axial fluid collections. Basal cisterns are
patent.

No skull fracture. Visualized paranasal sinuses and mastoid
air-cells are well-aerated. The included ocular globes and orbital
contents are non-suspicious.
IMPRESSION: No acute intracranial process: Normal noncontrast CT of the head.

  By: Jos Mark Martellacci

## 2015-10-26 IMAGING — CR DG CHEST 2V
2 series · 2 of 2 positions shown · non-contrast
Comparison: None.

CLINICAL DATA: Cough, fever, headache.

EXAM:
CHEST  2 VIEW

[w chest lat]
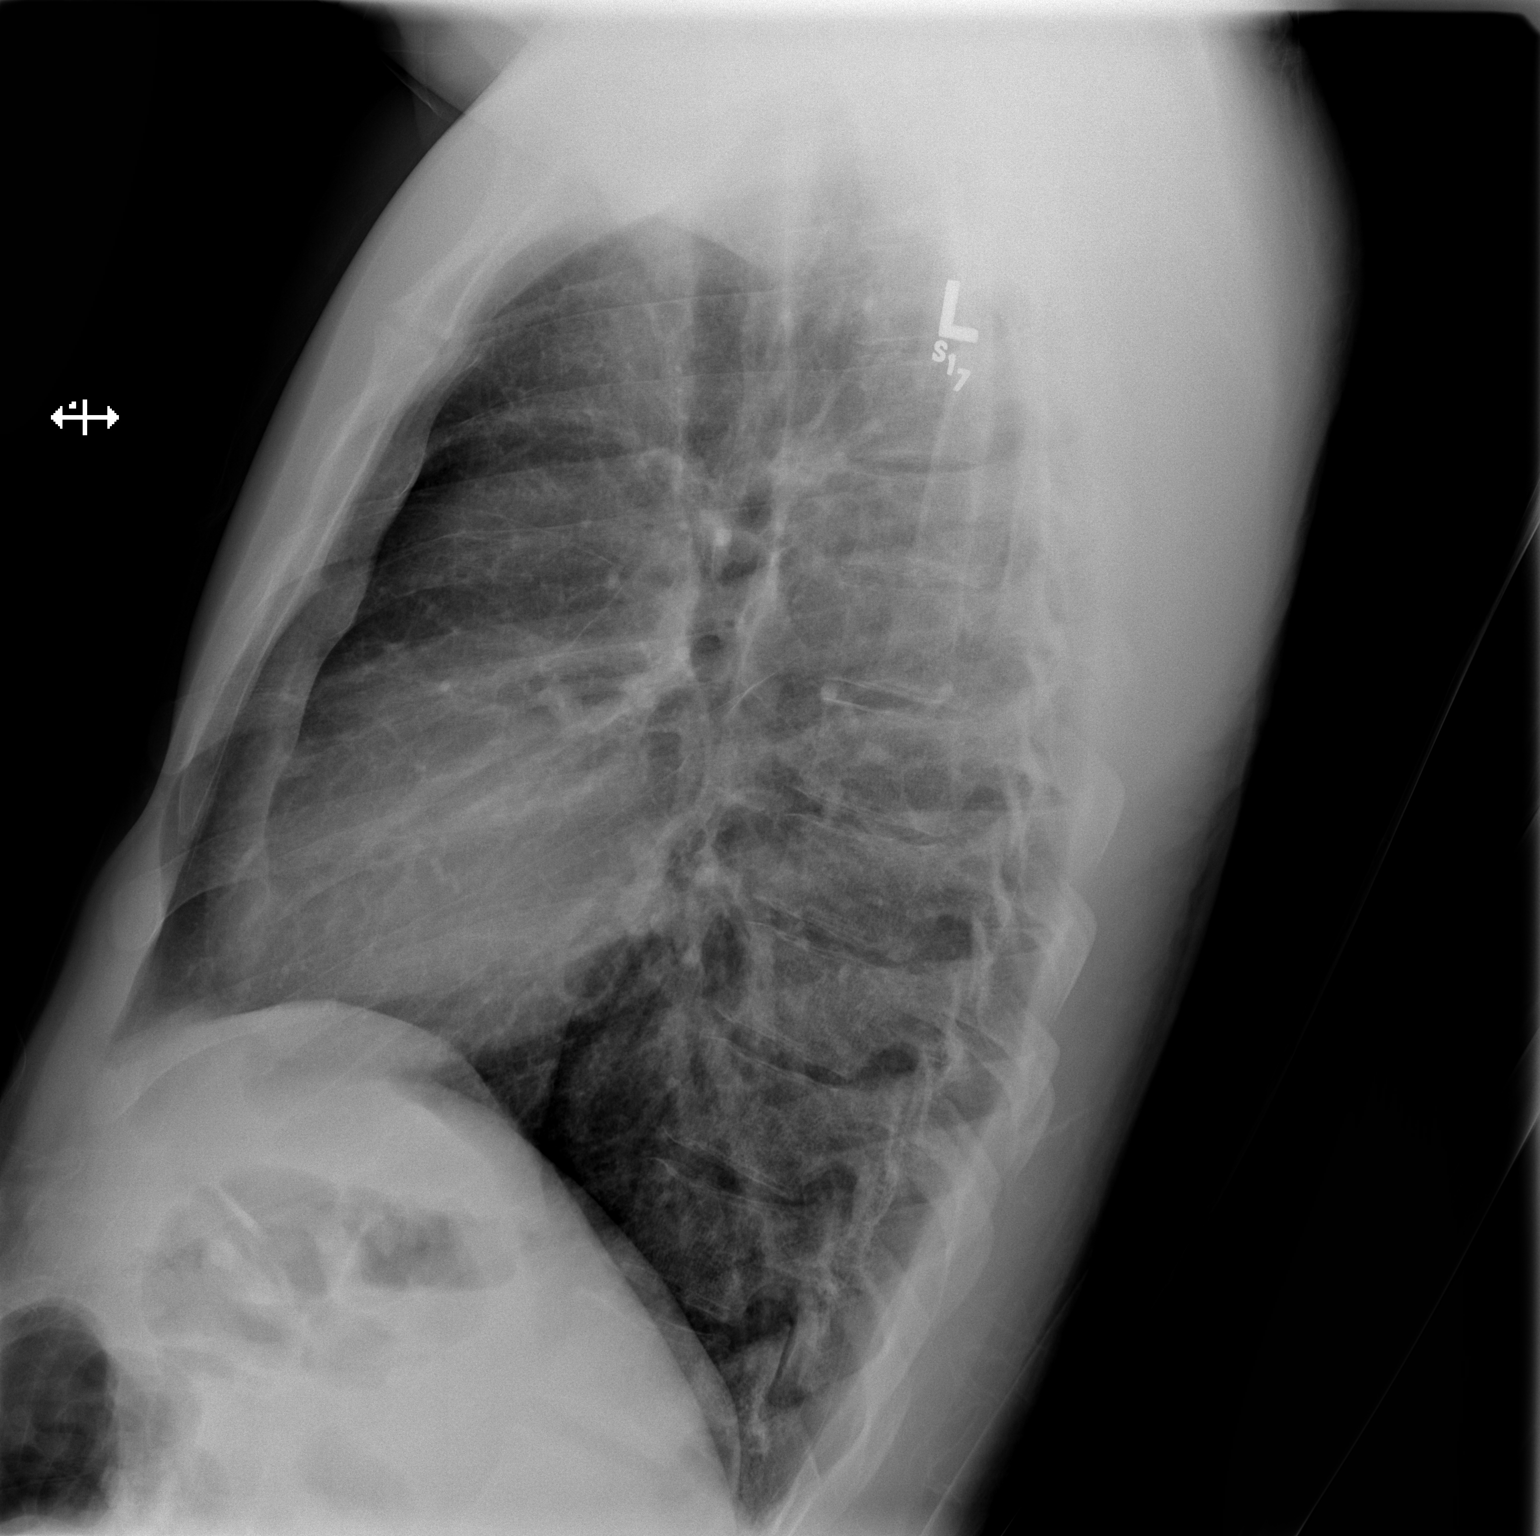

[x chest ap]
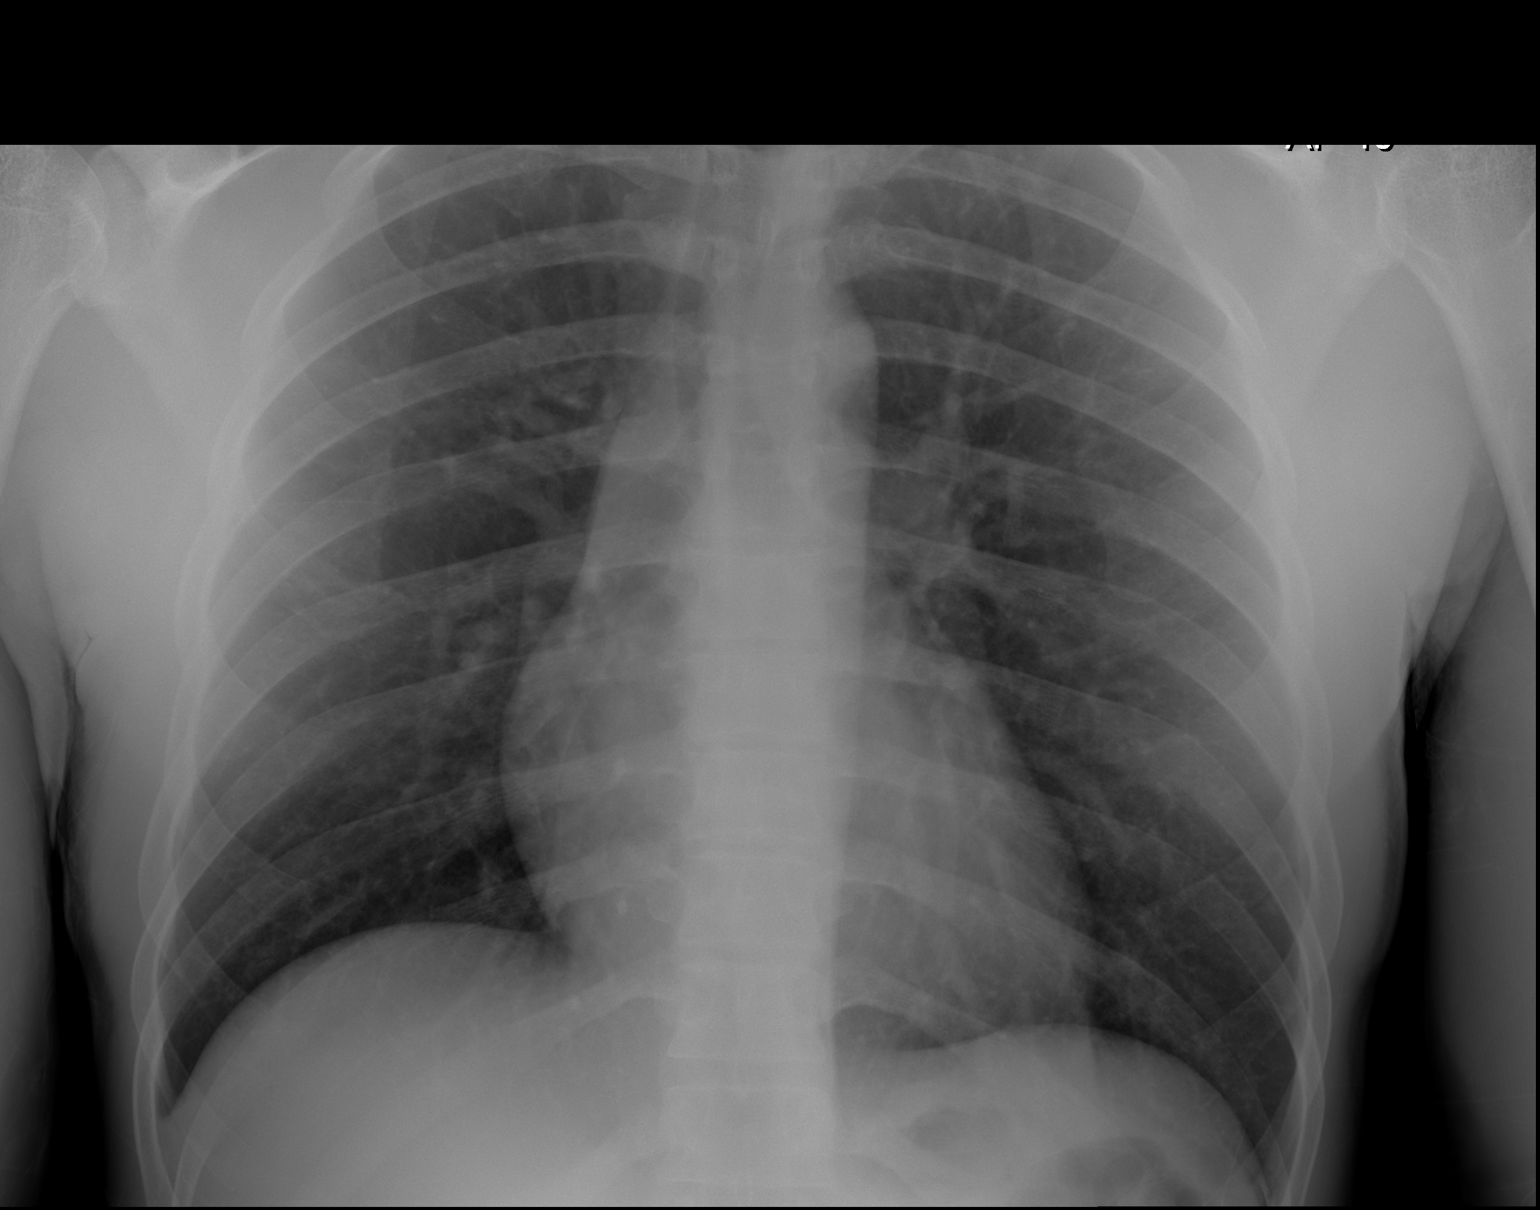

[2 of 2 positions shown; findings below may reference images not displayed]

FINDINGS: Normal heart size and mediastinal contours. No acute infiltrate or
edema. No effusion or pneumothorax. No acute osseous findings.
IMPRESSION: No active cardiopulmonary disease.

## 2017-04-20 ENCOUNTER — Emergency Department (HOSPITAL_COMMUNITY)
Admission: EM | Admit: 2017-04-20 | Discharge: 2017-04-20 | Disposition: A | Discharge status: Home / Self Care | Payer: BLUE CROSS/BLUE SHIELD | Attending: Emergency Medicine | Admitting: Emergency Medicine

## 2017-04-20 ENCOUNTER — Emergency Department (HOSPITAL_COMMUNITY): Payer: BLUE CROSS/BLUE SHIELD

## 2017-04-20 ENCOUNTER — Encounter (HOSPITAL_COMMUNITY): Payer: Self-pay | Admitting: Emergency Medicine

## 2017-04-20 DIAGNOSIS — F17210 Nicotine dependence, cigarettes, uncomplicated: Secondary | ICD-10-CM | POA: Insufficient documentation

## 2017-04-20 DIAGNOSIS — I10 Essential (primary) hypertension: Secondary | ICD-10-CM | POA: Diagnosis not present

## 2017-04-20 DIAGNOSIS — R9431 Abnormal electrocardiogram [ECG] [EKG]: Secondary | ICD-10-CM | POA: Diagnosis present

## 2017-04-20 LAB — CBC
HEMATOCRIT: 45.7 % (ref 39.0–52.0)
Hemoglobin: 15.8 g/dL (ref 13.0–17.0)
MCH: 29.6 pg (ref 26.0–34.0)
MCHC: 34.6 g/dL (ref 30.0–36.0)
MCV: 85.7 fL (ref 78.0–100.0)
Platelets: 240 10*3/uL (ref 150–400)
RBC: 5.33 MIL/uL (ref 4.22–5.81)
RDW: 15.9 % — ABNORMAL HIGH (ref 11.5–15.5)
WBC: 10.6 10*3/uL — AB (ref 4.0–10.5)

## 2017-04-20 LAB — BASIC METABOLIC PANEL
Anion gap: 10 (ref 5–15)
BUN: 11 mg/dL (ref 6–20)
CO2: 26 mmol/L (ref 22–32)
Calcium: 10.1 mg/dL (ref 8.9–10.3)
Chloride: 104 mmol/L (ref 101–111)
Creatinine, Ser: 1.26 mg/dL — ABNORMAL HIGH (ref 0.61–1.24)
GFR calc Af Amer: >60 mL/min (ref >60)
GFR calc non Af Amer: >60 mL/min (ref >60)
GLUCOSE: 112 mg/dL — AB (ref 65–99)
Potassium: 4 mmol/L (ref 3.5–5.1)
Sodium: 140 mmol/L (ref 135–145)

## 2017-04-20 LAB — I-STAT TROPONIN, ED: Troponin i, poc: 0.01 ng/mL (ref 0.00–0.08)

## 2017-04-20 NOTE — Discharge Instructions (Signed)
Your blood pressure today was mildly elevated.  Your EKG showed evidence of left ventricular hypertrophy, which is usually because of prolonged high blood pressure.  There was no sign of other complications today.  As initial treatment for high blood pressure it is important to watch your salt intake.  Try not to add any salt to your food, and be careful when eating out, to avoid foods which contain a lot of salt.  Additionally it is helpful to drink plenty of water each day, and get some regular sustained exercise, for 30-60 minutes, 3 or 4 days a week.  Return here, if needed, for problems.

## 2017-04-20 NOTE — ED Notes (Signed)
Pt updated via [employer]

## 2017-04-20 NOTE — ED Provider Notes (Addendum)
[facility]-EMERGENCY DEPT Provider Note   CSN: [id number] Arrival date & time: 04/20/17  1401     History   Chief Complaint Chief Complaint  Patient presents with  . Abnormal ECG    HPI Randy [name]. is a 36 y.o. male.  He is here for evaluation of an episode of dizziness which he describes as "wooziness," which prevented him from driving for about 30 minutes, several days ago.  He has not had a recurrent episode of dizziness.  Because of this symptom, he has been watching his blood pressure and has had it checked at home by a family members machine, reading greater than 200, and at [facility] today when it was 170/110.  He has never been treated for high blood pressure nor told that he had high blood pressure by a clinician.  He denies headache, nausea, vomiting, blurred vision, paresthesia, focal weakness, or back pain.  He works as a Curator.  He went to a clinic today, first visit to be evaluated, and after I checked his blood pressure, they did an EKG which was abnormal so sent him here.  He brought the EKG with him and it indicates normal sinus rhythm, with voltage criteria for LVH.  There were no ST segment elevation or depressions.  The outside EKG was sent for scanning into the record today.  There are no other known modifying factors.    HPI  Past Medical History:  Diagnosis Date  . Migraines   . Seizures [facility])     Patient Active Problem List   Diagnosis Date Noted  . Recurrent boils 07/21/2014  . Thrush 07/21/2014  . Hypokalemia 01/07/2014  . Meningitis 01/06/2014  . Rash and nonspecific skin eruption 01/06/2014    History reviewed. No pertinent surgical history.     Home Medications    Prior to Admission medications   Medication Sig Start Date End Date Taking? Authorizing Provider  Aspirin-Salicylamide-Caffeine (BC HEADACHE POWDER PO) Take 1 packet by mouth daily as needed (pain).   Yes [provider]  doxycycline (VIBRAMYCIN) 100 MG capsule Take 1  capsule (100 mg total) by mouth 2 (two) times daily. Patient not taking: Reported on 04/20/2017 07/21/14   Sherren Mocha, MD  fluconazole (DIFLUCAN) 100 MG tablet Take 1 tablet (100 mg total) by mouth once. Take 2 tabs po x 1, then start 1 tab po qd Patient not taking: Reported on 04/20/2017 07/21/14   Sherren Mocha, MD  hydrOXYzine (ATARAX/VISTARIL) 25 MG tablet Take 0.5-1 tablets (12.5-25 mg total) by mouth every 8 (eight) hours as needed for itching. Patient not taking: Reported on 04/20/2017 07/21/14   Sherren Mocha, MD  mupirocin ointment (BACTROBAN) 2 % Apply 1 application topically 3 (three) times daily. Patient not taking: Reported on 04/20/2017 07/21/14   Sherren Mocha, MD  triamcinolone cream (KENALOG) 0.1 % Apply 1 application topically 2 (two) times daily. Patient not taking: Reported on 04/20/2017 07/21/14   Sherren Mocha, MD    Family History No family history on file.  Social History Social History  Substance Use Topics  . Smoking status: Current Every Day Smoker    Types: Cigarettes  . Smokeless tobacco: Not on file  . Alcohol use Yes     Allergies   Patient has no known allergies.   Review of Systems Review of Systems  All other systems reviewed and are negative.    Physical Exam Updated Vital Signs BP (!) 142/107   Pulse 72   Temp  98.7 F (37.1 C) (Oral)   Resp (!) 21   SpO2 96%   Physical Exam  Constitutional: He is oriented to person, place, and time. He appears well-developed and well-nourished.  HENT:  Head: Normocephalic and atraumatic.  Right Ear: External ear normal.  Left Ear: External ear normal.  Eyes: Conjunctivae and EOM are normal. Pupils are equal, round, and reactive to light.  Neck: Normal range of motion and phonation normal. Neck supple.  Cardiovascular: Normal rate, regular rhythm and normal heart sounds.   Pulmonary/Chest: Effort normal and breath sounds normal. He exhibits no bony tenderness.  Abdominal: Soft. There is no tenderness.    Musculoskeletal: Normal range of motion.  Neurological: He is alert and oriented to person, place, and time. No cranial nerve deficit or sensory deficit. He exhibits normal muscle tone. Coordination normal.  No dysarthria aphasia or nystagmus  Skin: Skin is warm, dry and intact.  Psychiatric: He has a normal mood and affect. His behavior is normal. Judgment and thought content normal.  Nursing note and vitals reviewed.    ED Treatments / Results  Labs (all labs ordered are listed, but only abnormal results are displayed) Labs Reviewed  BASIC METABOLIC PANEL - Abnormal; Notable for the following:       Result Value   Glucose, Bld 112 (*)    Creatinine, Ser 1.26 (*)    All other components within normal limits  CBC - Abnormal; Notable for the following:    WBC 10.6 (*)    RDW 15.9 (*)    All other components within normal limits  I-STAT TROPOININ, ED    EKG  EKG Interpretation  Date/Time:  Friday April 20 2017 14:08:31 EDT Ventricular Rate:  83 PR Interval:  184 QRS Duration: 96 QT Interval:  348 QTC Calculation: 408 R Axis:   154 Text Interpretation:   Suspect arm lead reversal, interpretation assumes no reversal Normal sinus rhythm Minimal voltage criteria for LVH, may be normal variant Lateral infarct , age undetermined Abnormal ECG No old tracing to compare Confirmed by [facility]  MD, Brittinee Risk [phone]) on 04/20/2017 6:11:51 PM       EKG Interpretation  Date/Time:  Friday April 20 2017 21:03:27 EDT Ventricular Rate:  80 PR Interval:  184 QRS Duration: 80 QT Interval:  347 QTC Calculation: 401 R Axis:   61 Text Interpretation:  Sinus rhythm LVH by voltage Anterior Q waves, possibly due to LVH Since last tracing of earlier today arm lead reversal has been corrected. Confirmed by Effie Shy  MD, Wayde Gopaul [phone]) on 04/20/2017 9:15:06 PM         Radiology Dg Chest 2 View  Result Date: 04/20/2017 CLINICAL DATA:  Abnormal EKG. EXAM: CHEST  2 VIEW COMPARISON:  01/06/2014 .  FINDINGS: Mediastinum and hilar structures normal. Lungs are clear. Heart size normal. No pleural effusion or pneumothorax. No acute bony abnormality . IMPRESSION: No acute cardiopulmonary disease . Electronically Signed   By: Maisie Fus  Register   On: 04/20/2017 14:44    Procedures Procedures (including critical care time)  Medications Ordered in ED Medications - No data to display   Initial Impression / Assessment and Plan / ED Course  I have reviewed the triage vital signs and the nursing notes.  Pertinent labs & imaging results that were available during my care of the patient were reviewed by me and considered in my medical decision making (see chart for details).     Medications - No data to display  No data found.  9:15 PM Reevaluation with update and discussion. After initial assessment and treatment, an updated evaluation reveals no additional complaints.  Findings discussed with patient and all questions answered. Terrica Duecker L    Final Clinical Impressions(s) / ED Diagnoses   Final diagnoses:  Hypertension, unspecified type    Mild high blood pressure, with LVH on EKG.  No other signs of hypertensive urgency, ACS, metabolic instability or impending vascular collapse.  Nursing Notes Reviewed/ Care Coordinated Applicable Imaging Reviewed Interpretation of Laboratory Data incorporated into ED treatment  The patient appears reasonably screened and/or stabilized for discharge and I doubt any other medical condition or other [facility] requiring further screening, evaluation, or treatment in the ED at this time prior to discharge.  Plan: Home Medications- none; Home Treatments-low-salt diet, increase oral fluids, and regular exercise.; return here if the recommended treatment, does not improve the symptoms; Recommended follow up-PCP for blood pressure check in 1 month.    New Prescriptions Discharge Medication List as of 04/20/2017  9:30 PM       Mancel Bale,  MD 04/20/17 2124    Mancel Bale, MD 05/08/17 2050

## 2017-04-20 NOTE — ED Triage Notes (Signed)
Pt reports he was seen at [facility] physicians today regarding his high blood pressure, had an ekg and was told to come to ed bc it was abnormal, denies chest pain or sob. Reports some back pain that was present upon waking this am. Pt a/ox4, resp e/u.

## 2019-09-09 ENCOUNTER — Encounter: Payer: Self-pay | Admitting: Physical Therapy

## 2019-09-09 ENCOUNTER — Other Ambulatory Visit: Payer: Self-pay

## 2019-09-09 ENCOUNTER — Ambulatory Visit: Payer: 59 | Attending: Family Medicine | Admitting: Physical Therapy

## 2019-09-09 DIAGNOSIS — M62838 Other muscle spasm: Secondary | ICD-10-CM | POA: Diagnosis present

## 2019-09-09 DIAGNOSIS — M5412 Radiculopathy, cervical region: Secondary | ICD-10-CM | POA: Insufficient documentation

## 2019-09-09 NOTE — Patient Instructions (Addendum)
Trigger Point Dry Needling  . What is Trigger Point Dry Needling (DN)? o DN is a physical therapy technique used to treat muscle pain and dysfunction. Specifically, DN helps deactivate muscle trigger points (muscle knots).  o A thin filiform needle is used to penetrate the skin and stimulate the underlying trigger point. The goal is for a local twitch response (LTR) to occur and for the trigger point to relax. No medication of any kind is injected during the procedure.   . What Does Trigger Point Dry Needling Feel Like?  o The procedure feels different for each individual patient. Some patients report that they do not actually feel the needle enter the skin and overall the process is not painful. Very mild bleeding may occur. However, many patients feel a deep cramping in the muscle in which the needle was inserted. This is the local twitch response.   Marland Kitchen How Will I feel after the treatment? o Soreness is normal, and the onset of soreness may not occur for a few hours. Typically this soreness does not last longer than two days.  o Bruising is uncommon, however; ice can be used to decrease any possible bruising.  o In rare cases feeling tired or nauseous after the treatment is normal. In addition, your symptoms may get worse before they get better, this period will typically not last longer than 24 hours.   . What Can I do After My Treatment? o Increase your hydration by drinking more water for the next 24 hours. o You may place ice or heat on the areas treated that have become sore, however, do not use heat on inflamed or bruised areas. Heat often brings more relief post needling. o You can continue your regular activities, but vigorous activity is not recommended initially after the treatment for 24 hours. o DN is best combined with other physical therapy such as strengthening, stretching, and other therapies.     Access Code: [email or web address]  URL: [email or web address]  Date:  09/09/2019  Prepared by: Sherol Dade   Exercises  Supine Chin Tuck - 10 reps - 2x daily - 7x weekly  Standing Scapular Retraction - 10 reps - 2x daily - 7x weekly    [facility] [address] [place], [place] [address] Phone # [phone] Fax [phone]

## 2019-09-09 NOTE — Therapy (Signed)
[facility] [address], [address] [place], [place], [address] Phone: [phone]   Fax:  [phone]  Physical Therapy Evaluation  Patient Details  Name: Randy [name]. MRN: [id number] Date of Birth: Aug 25, 1981 Referring Provider (PT): Dibas Docia Chuck, MD    Encounter Date: 09/09/2019  PT End of Session - 09/09/19 1708    Visit Number  1    Date for PT Re-Evaluation  10/24/19    Authorization Type  UHC    Authorization Time Period  09/09/19 to 10/24/19    PT Start Time  1631    PT Stop Time  1714    PT Time Calculation (min)  43 min    Activity Tolerance  Patient tolerated treatment well;No increased pain    Behavior During Therapy  WFL for tasks assessed/performed       Past Medical History:  Diagnosis Date  . Migraines   . Seizures ([name])     History reviewed. No pertinent surgical history.  There were no vitals filed for this visit.   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 He noticed Rt sided neck pain after about 5 minutes later. He feels that his pain will usually flare up by the end of the day or when propping on his Rt elbow. He describes the pain as dull but when he leans on the Rt elbow he will notice the tingling in his forearm and thumb/index finger.    Currently in Pain?  No/denies         [facility] PT Assessment - 09/09/19 0001      Assessment   Medical Diagnosis  Cervical radiculopathy    Referring Provider (PT)  Dibas Koirala, MD     Onset Date/Surgical Date  --   4-5 months ago    Hand Dominance  Right    Next MD Visit  unsure    Prior Therapy  none       Precautions   Precautions  None      Balance Screen   Has the patient fallen in the past 6 months  No    Has the patient had a decrease in activity level because of a fear of falling?   No    Is the patient reluctant to  leave their home because of a fear of falling?   No      Prior Function   Level of Independence  Independent    Vocation  Full time employment    Lexicographer: alot of lifting      Cognition   Overall Cognitive Status  Within Functional Limits for tasks assessed      Observation/Other Assessments   Focus on Therapeutic Outcomes (FOTO)   12%      Posture/Postural Control   Posture Comments  forward head, rounded shoulders      ROM / Strength   AROM / PROM / Strength  AROM;Strength      AROM   AROM Assessment Site  Cervical    Cervical Flexion  WNL, pain free     Cervical Extension  45   (+) pain to outside of shoulder    Cervical - Right Rotation  60    Cervical - Left Rotation  60      Strength   Overall Strength Comments  BUE strength 5/5 MMT; grip  strength Rt: lb, Lt lb       Palpation   Spinal mobility  tender along C6, C7    Palpation comment  tenderness with palpation upper trap, Rt sided cervical paraspinals                Objective measurements completed on examination: See above findings.      [place] Adult PT Treatment/Exercise - 09/09/19 0001      Exercises   Exercises  Neck      Neck Exercises: Seated   Other Seated Exercise  scap retraction x5 reps, cues to avoid forward head       Neck Exercises: Supine   Neck Retraction  10 reps    Neck Retraction Limitations  tactile cues for proper technique      Manual Therapy   Manual Therapy  Manual Traction    Manual Traction  Cervical: 5x10 sec              PT Education - 09/09/19 1713    Education Details  posture at work; [employer] implemented; Therapist, sports) Educated  Patient    Methods  Handout;Explanation    Comprehension  Verbalized understanding       PT Short Term Goals - 09/09/19 1723      PT SHORT TERM GOAL #1   Title  Pt will demo consistency and independence with his initial HEP to increased cervical ROM and decrease pain while at  work.    Time  3    Period  Weeks    Status  New    Target Date  09/30/19        PT Long Term Goals - 09/09/19 1723      PT LONG TERM GOAL #1   Title  Pt will demo improved active cervical ROM to atleast 50 deg without pain which will allow him to maintain better posture while at work.    Time  6    Period  Weeks    Status  New    Target Date  10/24/19      PT LONG TERM GOAL #2   Title  Pt will  report atleast 60% improvement in his Rt UE symptoms and neck pain from the start of PT which will improve his quality of life.    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    Time  6    Period  Weeks    Status  New             Plan - 09/09/19 1716    Clinical Impression Statement  Pt is a pleasant 38 y.o M referred to OPPT with complaints of Rt sided neck pain with radicular symptoms down his Rt UE onset 4-5 months ago. He has good UE strength and cervical ROM is within normal limits and pain free, except for active cervical extension which is limited to 45 deg and painful at end range. Pt has tenderness along C6, C7 but responded well to manual cervical distraction, noting centralized pain with this. 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 He was encouraged to increase  his awareness of prolonged neck positions while at work and was provided with his initial HEP. Pt would benefit from skilled PT to promote cervical spine strength and mobility improvements, decrease radicular symptoms and facilitate his return to near pain free work activity.    Examination-Participation Restrictions  Other    Stability/Clinical Decision Making  Stable/Uncomplicated    Clinical Decision Making  Low    Rehab Potential   Excellent    PT Frequency  Other (comment)   1-2x/week as needed for symptom mangement   PT Duration  6 weeks    PT Treatment/Interventions  ADLs/Self Care Home Management;Traction;Moist Heat;Cryotherapy;Therapeutic activities;Therapeutic exercise;Neuromuscular re-education;Patient/family education;Manual techniques;Dry needling;Taping;Spinal Manipulations;Passive range of motion    PT Next Visit Plan  d/n low cervical multifidi; thoracic mobility; cervical distraction and deep neck flexor activation/strengthening    PT Home Exercise Plan  [email or web address]    Consulted and Agree with Plan of Care  Patient       Patient will benefit from skilled therapeutic intervention in order to improve the following deficits and impairments:  Decreased activity tolerance, Decreased strength, Impaired flexibility, Decreased range of motion, Increased muscle spasms, Hypomobility, Postural dysfunction, Pain, Improper body mechanics  Visit Diagnosis: Radiculopathy, cervical region  Other muscle spasm     Problem List Patient Active Problem List   Diagnosis Date Noted  . Recurrent boils 07/21/2014  . Thrush 07/21/2014  . Hypokalemia 01/07/2014  . Meningitis 01/06/2014  . Rash and nonspecific skin eruption 01/06/2014    5:31 PM,09/09/19 Donita BrooksSara Katherin Ramey PT, DPT [facility]  [phone]  [facility] [address] [place], [place], [address] Phone: [phone]   Fax:  [phone]  Name: Randy ChickVictor Cissell Jr. MRN: [id number] Date of Birth: 05-Apr-1981

## 2019-09-16 ENCOUNTER — Other Ambulatory Visit: Payer: Self-pay

## 2019-09-16 ENCOUNTER — Encounter: Payer: Self-pay | Admitting: Physical Therapy

## 2019-09-16 ENCOUNTER — Ambulatory Visit: Payer: 59 | Admitting: Physical Therapy

## 2019-09-16 DIAGNOSIS — M5412 Radiculopathy, cervical region: Secondary | ICD-10-CM

## 2019-09-16 DIAGNOSIS — M62838 Other muscle spasm: Secondary | ICD-10-CM

## 2019-09-16 NOTE — Therapy (Signed)
[facility] [address] [place], [place], [address] Phone: [phone]   Fax:  [phone]  Physical Therapy Treatment  Patient Details  Name: Randy [name]. MRN: [id number] Date of Birth: [birth year] Referring Provider (PT): Dibas Docia Chuck, MD    Encounter Date: 09/16/2019  PT End of Session - 09/16/19 1759    Visit Number  2    Date for PT Re-Evaluation  10/24/19    Authorization Type  UHC    Authorization Time Period  09/09/19 to 10/24/19    PT Start Time  1535    PT Stop Time  1615    PT Time Calculation (min)  40 min    Activity Tolerance  Patient tolerated treatment well;No increased pain    Behavior During Therapy  WFL for tasks assessed/performed       Past Medical History:  Diagnosis Date  . Migraines   . Seizures ([name])     History reviewed. No pertinent surgical history.  There were no vitals filed for this visit.  Subjective Assessment - 09/16/19 1537    Subjective  Pt has had some tingling in Rt arm, thumb and 2nd digit today.    Currently in Pain?  Yes    Pain Score  2     Pain Location  Hand    Pain Orientation  Right    Pain Descriptors / Indicators  Pins and needles    Pain Type  Chronic pain    Pain Radiating Towards  Right hand, thumb, second digit tingling    Pain Onset  More than a month ago    Pain Frequency  Intermittent                       [facility] Adult PT Treatment/Exercise - 09/16/19 0001      Self-Care   Self-Care  Other Self-Care Comments    Other Self-Care Comments   dry needling after-care, neutral neck posture during work      Neck Exercises: Seated   Other Seated Exercise  upper trap and levator stretch (HEP) 1x30 sec each, Rt only      Manual Therapy   Manual Therapy  Manual Traction;Soft tissue mobilization    Soft tissue mobilization  active release Rt upper trap anterior aspect    Manual Traction  cervical in flexion and flexion with Lt  rot, 5x10 sec        Trigger Point Dry Needling - 09/16/19 0001    Consent Given?  Yes    Education Handout Provided  Previously provided    Muscles Treated Head and Neck  Upper trapezius;Cervical multifidi    Dry Needling Comments  right upper trap, Rt C6/7 multif    Upper Trapezius Response  Twitch reponse elicited;Palpable increased muscle length    Cervical multifidi Response  Twitch reponse elicited;Palpable increased muscle length           PT Education - 09/16/19 1614    Education Details  Access Code: [email or web address]    Person(s) Educated  Patient    Methods  Explanation;Demonstration;Verbal cues;Handout    Comprehension  Verbalized understanding;Returned demonstration       PT Short Term Goals - 09/16/19 1804      PT SHORT TERM GOAL #1   Title  Pt will demo consistency and independence with his initial HEP to increased cervical ROM and decrease pain while at work.    Status  On-going  PT Long Term Goals - 09/09/19 1723      PT LONG TERM GOAL #1   Title  Pt will demo improved active cervical ROM to atleast 50 deg without pain which will allow him to maintain better posture while at work.    Time  6    Period  Weeks    Status  New    Target Date  10/24/19      PT LONG TERM GOAL #2   Title  Pt will  report atleast 60% improvement in his Rt UE symptoms and neck pain from the start of PT which will improve his quality of life.    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    Time  6    Period  Weeks    Status  New            Plan - 09/16/19 1800    Clinical Impression Statement  Pt with good response to DN, active release and manual traction today.  Pt reported feeling looser and resolution of Rt hand tingling end of session.  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  PT also updated HEP to give Rt sided upper trap and levator scapula stretches.  Pt will continue to benefit from skilled PT along current POC.    Rehab Potential  Excellent    PT Frequency  Other (comment)   1-2x/week   PT Duration  6 weeks    PT Treatment/Interventions  ADLs/Self Care Home Management;Traction;Moist Heat;Cryotherapy;Therapeutic activities;Therapeutic exercise;Neuromuscular re-education;Patient/family education;Manual techniques;Dry needling;Taping;Spinal Manipulations;Passive range of motion    PT Next Visit Plan  f/u on DN 1 to Rt upper trap and multifidus, review stretches, manual cervical traction/STM, progress cervical retraction in various postures (quad, squatting, reaching), add SNAGs    PT Home Exercise Plan  [email or web address]    Consulted and Agree with Plan of Care  Patient       Patient will benefit from skilled therapeutic intervention in order to improve the following deficits and impairments:     Visit Diagnosis: Radiculopathy, cervical region  Other muscle spasm     Problem List Patient Active Problem List   Diagnosis Date Noted  . Recurrent boils 07/21/2014  . Thrush 07/21/2014  . Hypokalemia 01/07/2014  . Meningitis 01/06/2014  . Rash and nonspecific skin eruption 01/06/2014    Baruch Merl, PT 09/16/19 6:05 PM   [facility] [address], [place], [place], [address] Phone: [phone]   Fax:  [phone]  Name: Randy [name]. MRN: [id number] Date of Birth: 1981/06/09

## 2019-09-16 NOTE — Patient Instructions (Signed)
Access Code: [email or web address]  URL: [email or web address]  Date: 09/16/2019  Prepared by: Venetia Night Beuhring   Exercises  Supine Chin Tuck - 10 reps - 2x daily - 7x weekly  Standing Scapular Retraction - 10 reps - 2x daily - 7x weekly  Seated Levator Scapulae Stretch - 3 reps - 2 sets - 30 hold - 1x daily - 7x weekly  Seated Upper Trapezius Stretch - 3 reps - 2 sets - 30 hold - 1x daily - 7x weekly

## 2019-09-18 ENCOUNTER — Other Ambulatory Visit: Payer: Self-pay

## 2019-09-18 ENCOUNTER — Ambulatory Visit: Payer: 59 | Admitting: Physical Therapy

## 2019-09-18 ENCOUNTER — Encounter: Payer: Self-pay | Admitting: Physical Therapy

## 2019-09-18 DIAGNOSIS — M5412 Radiculopathy, cervical region: Secondary | ICD-10-CM | POA: Diagnosis not present

## 2019-09-18 DIAGNOSIS — M62838 Other muscle spasm: Secondary | ICD-10-CM

## 2019-09-18 NOTE — Therapy (Signed)
[facility] [address] [place], [place], [address] Phone: [phone]   Fax:  [phone]  Physical Therapy Treatment  Patient Details  Name: Randy [name]. MRN: [id number] Date of Birth: September 06, 1981 Referring Provider (PT): Dibas Dorthy Cooler, MD    Encounter Date: 09/18/2019  PT End of Session - 09/18/19 1357    Visit Number  3    Date for PT Re-Evaluation  10/24/19    Authorization Type  UHC    Authorization Time Period  09/09/19 to 10/24/19    PT Start Time  1357    PT Stop Time  1440    PT Time Calculation (min)  43 min       Past Medical History:  Diagnosis Date  . Migraines   . Seizures ([place])     History reviewed. No pertinent surgical history.  There were no vitals filed for this visit.  Subjective Assessment - 09/18/19 1357    Subjective  I've only had one episode of brief tingling in my Rt hand since last here - that was because I was in a bad position on my couch and I re-positioned my posture and it was better.  My Rt shoulder is bothering me today b/c I was just using Rt arm a lot at work.    Currently in Pain?  Yes    Pain Score  1     Pain Location  Shoulder    Pain Orientation  Right;Posterior    Pain Descriptors / Indicators  Aching    Pain Type  Chronic pain    Pain Onset  More than a month ago    Pain Frequency  Intermittent    Aggravating Factors   overhead work with Rt hand    Pain Relieving Factors  dry needling, stretching, using better posture                       [facility] Adult PT Treatment/Exercise - 09/18/19 0001      Neuro Re-ed    Neuro Re-ed Details   neck retraction on foam roller during Ys, Ws, Ts, towel roll under neck      Neck Exercises: Standing   Other Standing Exercises  doorway stretch 1x30 sec      Neck Exercises: Seated   Cervical Rotation  Right;5 reps    Cervical Rotation Limitations  10 sec hold using towel SNAGs      Neck Exercises:  Supine   Other Supine Exercise  Ys, Ws, Ts bil arms on vertical foam roller along spine, towel roll under neck      Neck Exercises: Prone   Other Prone Exercise  quadruped neck retraction 2x10 sec, arms portion of bird dog x 1 rep each      Manual Therapy   Manual Therapy  Passive ROM;Joint mobilization;Soft tissue mobilization    Joint Mobilization  Rt shoulder posterior glide Gr III/IV x 3 bouts    Soft tissue mobilization  Rt posterior shoulder after DN    Passive ROM  Rt shoulder end range flexion, abduction       Trigger Point Dry Needling - 09/18/19 0001    Consent Given?  Yes    Education Handout Provided  Previously provided    Muscles Treated Upper Quadrant  Infraspinatus;Deltoid;Teres minor    Other Dry Needling  Right    Infraspinatus Response  Twitch response elicited;Palpable increased muscle length    Deltoid Response  Twitch response elicited;Palpable  increased muscle length    Teres minor Response  Twitch response elicited;Palpable increased muscle length           PT Education - 09/18/19 1441    Education Details  Access Code: [email or web address]    Person(s) Educated  Patient    Methods  Explanation;Demonstration;Handout;Verbal cues    Comprehension  Verbalized understanding;Returned demonstration       PT Short Term Goals - 09/16/19 1804      PT SHORT TERM GOAL #1   Title  Pt will demo consistency and independence with his initial HEP to increased cervical ROM and decrease pain while at work.    Status  On-going        PT Long Term Goals - 09/18/19 1442      PT LONG TERM GOAL #1   Title  Pt will demo improved active cervical ROM to atleast 50 deg without pain which will allow him to maintain better posture while at work.    Status  Achieved      PT LONG TERM GOAL #2   Title  Pt will  report atleast 60% improvement in his Rt UE symptoms and neck pain from the start of PT which will improve his quality of life.    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    Baseline  Pt states he has been more aware at work    Status  On-going            Plan - 09/18/19 1443    Clinical Impression Statement  Pt reports only 1 brief episode of Rt hand tingling since last visit which he immediately corrected with a postural adjustment for forward head.  He states he is applying posture cues at work and has had less pain at work by about 30%.  Rt upper trap tension was improved from last visit today demo'ing well maintained flexibility between visits.  Pt had stiffness in Rt posterior shoulder and trigger points which improved signif with DN.  PT progressed to quadruped challenge for neck retraction, added cervical SNAGs for rotation and doorway stretch for improved upper quadrant posture/flexibility.  Pt has already met or is on his way toward meeting STG/LTGs.  He will continue to benefit from skilled PT along current POC.    PT Frequency  Other (comment)   1-2x/week   PT Duration  6 weeks    PT Treatment/Interventions  ADLs/Self Care Home Management;Traction;Moist Heat;Cryotherapy;Therapeutic activities;Therapeutic exercise;Neuromuscular re-education;Patient/family education;Manual techniques;Dry needling;Taping;Spinal Manipulations;Passive range of motion    PT Next Visit Plan  f/u on HEP, f/u on DN to posterior shoulder, manual cervical/DN prn, add lat stretch hanging in deep squat, add squats with dowel along spine to reinforce dynamic neck retraction, foam roller    PT Home Exercise Plan  [email or web address]    Consulted and Agree with Plan of Care  Patient       Patient will benefit from skilled therapeutic intervention in order to improve the following deficits and impairments:     Visit Diagnosis: Radiculopathy, cervical  region  Other muscle spasm     Problem List Patient Active Problem List   Diagnosis Date Noted  . Recurrent boils 07/21/2014  . Thrush 07/21/2014  . Hypokalemia  01/07/2014  . Meningitis 01/06/2014  . Rash and nonspecific skin eruption 01/06/2014    Baruch Merl, PT 09/18/19 2:49 PM   [facility] [address] [place], [place], [address] Phone: [phone]   Fax:  [phone]  Name: Randy [name]. MRN: [id number] Date of Birth: July 02, 1981

## 2019-09-18 NOTE — Patient Instructions (Signed)
Access Code: [email or web address]  URL: [email or web address]  Date: 09/18/2019  Prepared by: Venetia Night Jahseh Lucchese   Exercises  Supine Chin Tuck - 10 reps - 2x daily - 7x weekly  Standing Scapular Retraction - 10 reps - 2x daily - 7x weekly  Seated Levator Scapulae Stretch - 3 reps - 2 sets - 30 hold - 1x daily - 7x weekly  Seated Upper Trapezius Stretch - 3 reps - 2 sets - 30 hold - 1x daily - 7x weekly  Seated Assisted Cervical Rotation with Towel - 5 reps - 2 sets - 10 hold - 1x daily - 7x weekly  Quadruped Cervical Retraction - 10 reps - 3 sets - 1x daily - 7x weekly  Doorway Pec Stretch at 90 Degrees Abduction - 2 reps - 1 sets - 30 hold - 1x daily - 7x weekly

## 2019-09-23 ENCOUNTER — Ambulatory Visit: Payer: 59 | Admitting: Physical Therapy

## 2019-09-23 ENCOUNTER — Other Ambulatory Visit: Payer: Self-pay

## 2019-09-23 ENCOUNTER — Encounter: Payer: Self-pay | Admitting: Physical Therapy

## 2019-09-23 DIAGNOSIS — M5412 Radiculopathy, cervical region: Secondary | ICD-10-CM

## 2019-09-23 DIAGNOSIS — M62838 Other muscle spasm: Secondary | ICD-10-CM

## 2019-09-23 NOTE — Therapy (Signed)
[facility] [address] [place], [place], [address] Phone: [phone]   Fax:  [phone]  Physical Therapy Treatment  Patient Details  Name: Randy [name]. MRN: [id number] Date of Birth: 09/25/1981 Referring Provider (PT): Dibas Dorthy Cooler, MD    Encounter Date: 09/23/2019  PT End of Session - 09/23/19 1716    Visit Number  4    Date for PT Re-Evaluation  10/24/19    Authorization Type  UHC    Authorization Time Period  09/09/19 to 10/24/19    PT Start Time  1631    PT Stop Time  1711    PT Time Calculation (min)  40 min    Activity Tolerance  Patient tolerated treatment well;No increased pain    Behavior During Therapy  WFL for tasks assessed/performed       Past Medical History:  Diagnosis Date  . Migraines   . Seizures ([place])     History reviewed. No pertinent surgical history.  There were no vitals filed for this visit.  Subjective Assessment - 09/23/19 1633    Subjective  Pt states that things have been pretty good. He has had one instance of pain down the arm where it just shoots down and goes away. He has no shoulder pain currently but did have some soreness at work with some of the stuff he was doing.    Currently in Pain?  No/denies    Pain Onset  More than a month ago                       [facility] Adult PT Treatment/Exercise - 09/23/19 0001      Exercises   Exercises  Other Exercises;Shoulder    Other Exercises   quadruped cervical retraction hold with UE reach x5 reps each       Neck Exercises: Standing   Other Standing Exercises  BUE rows with blue TB x20 reps       Neck Exercises: Supine   Other Supine Exercise  thoracic extension over foam roll 2x10 reps       Neck Exercises: Prone   Rows  15 reps    Rows Limitations  Rt UE only, cuing for middle trap  activation    Other Prone Exercise  Rt UE Y with therapist assistance x10 reps       Shoulder Exercises: Sidelying    Other Sidelying Exercises  thoracic rotation stretch with LE pressed into foam roll x15 reps each direction      Manual Therapy   Soft tissue mobilization  Rt upper trap STM in seated       Neck Exercises: Stretches   Other Neck Stretches  Rt latissimus stretch in doorway 3x15 sec and deep squat 3x20 sec              PT Education - 09/23/19 1718    Education Details  technique with therex    Person(s) Educated  Patient    Methods  Explanation;Verbal cues    Comprehension  Verbalized understanding;Returned demonstration       PT Short Term Goals - 09/16/19 1804      PT SHORT TERM GOAL #1   Title  Pt will demo consistency and independence with his initial HEP to increased cervical ROM and decrease pain while at work.    Status  On-going        PT Long Term Goals - 09/18/19 1442  PT LONG TERM GOAL #1   Title  Pt will demo improved active cervical ROM to atleast 50 deg without pain which will allow him to maintain better posture while at work.    Status  Achieved      PT LONG TERM GOAL #2   Title  Pt will  report atleast 60% improvement in his Rt UE symptoms and neck pain from the start of PT which will improve his quality of life.    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    Baseline  Pt states he has been more aware at work    Status  On-going            Plan - 09/23/19 1715    Clinical Impression Statement  Pt continues to report minimal Rt UE symptoms throughout the day. He had 1 instance of radicular symptoms that lasted for seconds and resolved on its own. Today's session focused on increasing middle/lower trap activation, with visible muscle shaking during these exercises. Pt felt good stretch with latissimus stretch in  standing and deep squat position. Pt also has thoracic spine hypomobility noted with active rotation in sidelying. No pain was reported during or following today's exercise. Will continue with current POC.    PT Frequency  Other (comment)   1-2x/week   PT Duration  6 weeks    PT Treatment/Interventions  ADLs/Self Care Home Management;Traction;Moist Heat;Cryotherapy;Therapeutic activities;Therapeutic exercise;Neuromuscular re-education;Patient/family education;Manual techniques;Dry needling;Taping;Spinal Manipulations;Passive range of motion    PT Next Visit Plan  f/u on HEP, middle/low trap strengthening, manual cervical/DN prn, add lat stretch hanging in deep squat, add squats with dowel along spine to reinforce dynamic neck retraction, foam roller    PT Home Exercise Plan  [email or web address]    Consulted and Agree with Plan of Care  Patient       Patient will benefit from skilled therapeutic intervention in order to improve the following deficits and impairments:     Visit Diagnosis: Radiculopathy, cervical region  Other muscle spasm     Problem List Patient Active Problem List   Diagnosis Date Noted  . Recurrent boils 07/21/2014  . Thrush 07/21/2014  . Hypokalemia 01/07/2014  . Meningitis 01/06/2014  . Rash and nonspecific skin eruption 01/06/2014    5:20 PM,09/23/19 Donita Brooks PT, DPT [facility]  [phone]   [facility] [address], [address] [place], [place], [address] Phone: [phone]   Fax:  [phone]  Name: Randy [name]. MRN: [id number] Date of Birth: 1981-07-02

## 2019-09-30 ENCOUNTER — Telehealth: Payer: Self-pay | Admitting: Physical Therapy

## 2019-09-30 ENCOUNTER — Ambulatory Visit: Payer: 59 | Attending: Family Medicine | Admitting: Physical Therapy

## 2019-09-30 DIAGNOSIS — M62838 Other muscle spasm: Secondary | ICD-10-CM | POA: Insufficient documentation

## 2019-09-30 DIAGNOSIS — M5412 Radiculopathy, cervical region: Secondary | ICD-10-CM | POA: Insufficient documentation

## 2019-09-30 NOTE — Telephone Encounter (Signed)
Left message about missed appointment today 09/30/19 at 3:30 and asked for a call back to confirm upcoming appointment on 10/02/19 at 3:30pm. Baruch Merl, PT 09/30/19 3:48 PM

## 2019-10-02 ENCOUNTER — Ambulatory Visit: Payer: 59 | Admitting: Physical Therapy

## 2019-10-02 ENCOUNTER — Telehealth: Payer: Self-pay | Admitting: Physical Therapy

## 2019-10-02 NOTE — Telephone Encounter (Signed)
Left message for patient that this is his second consecutive no show without calling us back to confirm visits after last no show.  PT explained no show policy and that we would be cancelling remaining visits and discharging him.  He will need a new PT Rx should he have needs for further treatment. Johanna Beuhring, PT 10/02/19 3:51 PM

## 2019-10-02 NOTE — Telephone Encounter (Signed)
Pt arrived at PT appointment 25 min late and confirmed he would like to continue PT.  He wasn't able to be seen today but PT did not cancel remaining visits.  Will continue along plan of care.  Pt understands no show policy. Johanna Beuhring, PT 10/02/19 4:03 PM

## 2019-10-07 ENCOUNTER — Ambulatory Visit: Payer: 59 | Admitting: Physical Therapy

## 2019-10-07 ENCOUNTER — Other Ambulatory Visit: Payer: Self-pay

## 2019-10-07 ENCOUNTER — Encounter: Payer: Self-pay | Admitting: Physical Therapy

## 2019-10-07 ENCOUNTER — Encounter: Payer: 59 | Admitting: Physical Therapy

## 2019-10-07 DIAGNOSIS — M5412 Radiculopathy, cervical region: Secondary | ICD-10-CM

## 2019-10-07 DIAGNOSIS — M62838 Other muscle spasm: Secondary | ICD-10-CM

## 2019-10-07 NOTE — Therapy (Signed)
[facility] [address] [place], [place], [address] Phone: [phone]   Fax:  [phone]  Physical Therapy Treatment  Patient Details  Name: Randy [name]. MRN: [id number] Date of Birth: [birth year] Referring Provider (PT): Dibas Docia Chuck, MD    Encounter Date: 10/07/2019  PT End of Session - 10/07/19 1532    Visit Number  5    Date for PT Re-Evaluation  10/24/19    Authorization Type  UHC    Authorization Time Period  09/09/19 to 10/24/19    PT Start Time  1533    PT Stop Time  1621   10 min ice end of session   PT Time Calculation (min)  48 min    Activity Tolerance  Patient tolerated treatment well;No increased pain    Behavior During Therapy  WFL for tasks assessed/performed       Past Medical History:  Diagnosis Date  . Migraines   . Seizures ([name])     History reviewed. No pertinent surgical history.  There were no vitals filed for this visit.  Subjective Assessment - 10/07/19 1533    Subjective  Pain has localized into Rt neck.  Worse with work tasks.    Currently in Pain?  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      Neuro Re-ed    Neuro Re-ed Details   deep multifidus on foam roller along spine with towel roll 10x5 sec      Neck Exercises: Seated   Cervical Rotation  Both;10 reps    Cervical Rotation Limitations  with neck  retraction awareness      Shoulder Exercises: Supine   Horizontal ABduction  Strengthening;Both;15 reps;Theraband    Theraband Level (Shoulder Horizontal ABduction)  Level 3 (Green)    Horizontal ABduction Limitations  on foam roller with neck retraction cue    Diagonals  Strengthening;Both;15 reps;Theraband    Theraband Level (Shoulder Diagonals)  Level 3 (Green)    Diagonals Limitations  on foam roller with neck retraction cue      Shoulder Exercises: ROM/Strengthening   Other ROM/Strengthening Exercises  foam roller along spine with towel roll x 1' decompression/open chest, Ys, Ws, Ts x 5 cycles    Other ROM/Strengthening Exercises  quadruped neck retraction rows x 10# alt x 10      Modalities   Modalities  Cryotherapy      Cryotherapy   Number Minutes Cryotherapy  10 Minutes    Cryotherapy Location  Cervical    Type of Cryotherapy  Ice pack      Manual Therapy   Manual Therapy  Soft tissue mobilization    Soft tissue mobilization  Rt upper trap, deep multifidus and paraspinals bil C7-T3, intrascapular region on Rt       Trigger Point Dry Needling - 10/07/19 0001    Consent Given?  Yes    Education Handout Provided  Previously provided    Other Dry Needling  Right    Upper Trapezius Response  Twitch reponse elicited;Palpable increased muscle length             PT Short Term Goals - 09/16/19 1804      PT SHORT TERM GOAL #1   Title  Pt will demo consistency and independence with his initial HEP to increased cervical ROM and decrease pain while at work.    Status  On-going        PT Long Term Goals - 10/07/19 1614      PT LONG TERM GOAL #1   Title  Pt will demo improved active cervical ROM to atleast 50 deg without pain which will allow him to maintain better posture while at work.    Status  Achieved      PT LONG TERM GOAL #2   Title  Pt will  report atleast 60% improvement in his Rt UE symptoms and neck pain from the start of PT which will improve his  quality of life.    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    Status  On-going            Plan - 10/07/19 1611    Clinical Impression Statement  Pt reports 40-50% improvement in pain overall.  Pain has localized to Rt upper trap region and occassionally radiates into posterior shoulder but no more occurences of Rt UE tingling and pain.  PT performed DN to Rt upper trap with signif twitch and release. Pt very sore afterwards.  PT Focused on neck stabilization in various postures today with layering of UE use with neck retraction to simulate work tasks and postures.  Pt will continue to benefit from skilled PT along current POC.    Rehab Potential  Excellent    PT Frequency  2x / week    PT Duration  6 weeks    PT Treatment/Interventions  ADLs/Self Care Home Management;Traction;Moist Heat;Cryotherapy;Therapeutic activities;Therapeutic exercise;Neuromuscular re-education;Patient/family education;Manual techniques;Dry needling;Taping;Spinal Manipulations;Passive range of motion    PT Next Visit Plan  f/u on DN Rt upper trap, continue neck stabilization with UE strength, manual cervical traction/stretching/STM as needed    PT Home Exercise Plan  [email or web address]    Consulted and Agree with Plan of Care  Patient       Patient will benefit from skilled therapeutic intervention in order to improve the following deficits and impairments:     Visit Diagnosis: Radiculopathy, cervical region  Other muscle spasm     Problem List Patient Active Problem List   Diagnosis Date Noted  . Recurrent boils 07/21/2014  . Thrush 07/21/2014  . Hypokalemia 01/07/2014  . Meningitis 01/06/2014  . Rash and nonspecific skin eruption 01/06/2014    Baruch Merl, PT 10/07/19  4:15 PM   [facility] [address], [place], [place], [address] Phone: [phone]   Fax:  [phone]  Name: Randy [name]. MRN: [id number] Date of Birth: November 16, 1981

## 2019-10-09 ENCOUNTER — Other Ambulatory Visit: Payer: Self-pay

## 2019-10-09 ENCOUNTER — Encounter: Payer: Self-pay | Admitting: Physical Therapy

## 2019-10-09 ENCOUNTER — Ambulatory Visit: Payer: 59 | Admitting: Physical Therapy

## 2019-10-09 ENCOUNTER — Encounter: Payer: 59 | Admitting: Physical Therapy

## 2019-10-09 DIAGNOSIS — M5412 Radiculopathy, cervical region: Secondary | ICD-10-CM

## 2019-10-09 DIAGNOSIS — M62838 Other muscle spasm: Secondary | ICD-10-CM

## 2019-10-09 NOTE — Patient Instructions (Signed)
Access Code: [email or web address]  URL: [email or web address]  Date: 10/09/2019  Prepared by: Sherol Dade   Exercises  Supine Chin Tuck - 10 reps - 2x daily - 7x weekly  Standing Scapular Retraction - 10 reps - 2x daily - 7x weekly  Seated Levator Scapulae Stretch - 3 reps - 2 sets - 30 hold - 1x daily - 7x weekly  Seated Upper Trapezius Stretch - 3 reps - 2 sets - 30 hold - 1x daily - 7x weekly  Seated Assisted Cervical Rotation with Towel - 5 reps - 2 sets - 10 hold - 1x daily - 7x weekly  Quadruped Cervical Retraction - 10 reps - 3 sets - 1x daily - 7x weekly  Doorway Pec Stretch at 90 Degrees Abduction - 2 reps - 1 sets - 30 hold - 1x daily - 7x weekly  Prone Chest Stretch on Chair - 2 reps - 10 hold - 1x daily - 7x weekly    [facility] [address] [place], [place] [address] Phone # [phone] Fax [phone]

## 2019-10-09 NOTE — Therapy (Signed)
[facility] [address], [place], [place], [address] Phone: [phone]   Fax:  [phone]  Physical Therapy Treatment  Patient Details  Name: Randy [name]. MRN: [id number] Date of Birth: [birth year] Referring Provider (PT): Dibas Dorthy Cooler, MD    Encounter Date: 10/09/2019  PT End of Session - 10/09/19 1650    Visit Number  6    Date for PT Re-Evaluation  10/24/19    Authorization Type  UHC    Authorization Time Period  09/09/19 to 10/24/19    PT Start Time  1619    PT Stop Time  1324    PT Time Calculation (min)  39 min    Activity Tolerance  Patient tolerated treatment well;No increased pain    Behavior During Therapy  WFL for tasks assessed/performed       Past Medical History:  Diagnosis Date  . Migraines   . Seizures ([place])     History reviewed. No pertinent surgical history.  There were no vitals filed for this visit.  Subjective Assessment - 10/09/19 1622    Subjective  Pt has no pain at this time. He trained all day today. Yesterday at work he did pretty good. 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    Currently in Pain?  No/denies    Pain Onset  More than a month ago                       [facility] Adult PT Treatment/Exercise - 10/09/19 0001      Neck Exercises: Machines for Strengthening   UBE (Upper Arm Bike)  Seat 10 L5 x2.5 min forward, 1.5 min backward (PT present to discuss progress)       Shoulder Exercises: Supine   Protraction  Strengthening;Both;15 reps    Protraction Weight (lbs)  25 plate    Flexion  MWNU;27 reps    Flexion Limitations  red TB around wrists to encourage posterior shoulder activation     Diagonals  Strengthening;Both;15 reps;Theraband    Theraband Level (Shoulder Diagonals)  Level 3 (Green)    Diagonals Limitations  on foam roller with neck retraction cue      Shoulder Exercises: Prone   Other Prone Exercises  Y/T  Lt and Rt x15 reps each       Shoulder Exercises: ROM/Strengthening   Other ROM/Strengthening Exercises  rows #35 2x12 reps, therapist cuing for proper cervical alignment      Shoulder Exercises: Stretch   Other Shoulder Stretches  deep squat latissimus stretch 2x20 sec; pec stretch in doorway 2x20 reps     Other Shoulder Stretches  child's pose lat stretch over foam roll 5x10 sec hold              PT Education - 10/09/19 1650    Education Details  technique with therex    Person(s) Educated  Patient    Methods  Explanation;Verbal cues;Demonstration    Comprehension  Verbalized understanding;Returned demonstration       PT Short Term Goals - 10/09/19 1624      PT SHORT TERM GOAL #1   Title  Pt will demo consistency and independence with his initial HEP to increased cervical ROM and decrease pain while at work.    Status  Achieved        PT Long Term Goals - 10/09/19 1624      PT LONG TERM GOAL #1   Title  Pt will demo improved active cervical ROM to atleast 50 deg without pain which will allow him to maintain better posture while at work.    Status  Achieved      PT LONG TERM GOAL #2   Title  Pt will  report atleast 60% improvement in his Rt UE symptoms and neck pain from the start of PT which will improve his quality of life.    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    Status  On-going            Plan - 10/09/19 1700    Clinical Impression Statement  Pt had good improvements in pain yesterday at work following dry needling treatment earlier this week. Pt reports having most difficulty with pulling heavy tires while at work. He has evident lower trap weakness and difficulty coordinating with activity such as rows and reach overhead  due to a dominant upper trap. Today's session continued with focus on combined cervical postural control with UE activity. Also introduced middle and low trap specific exercises. Pt demonstrated muscle shaking and reported difficulty with this but was able to complete all exercises properly. Will continue with current POC.    Rehab Potential  Excellent    PT Frequency  2x / week    PT Duration  6 weeks    PT Treatment/Interventions  ADLs/Self Care Home Management;Traction;Moist Heat;Cryotherapy;Therapeutic activities;Therapeutic exercise;Neuromuscular re-education;Patient/family education;Manual techniques;Dry needling;Taping;Spinal Manipulations;Passive range of motion    PT Next Visit Plan  f/u on DN Rt upper trap, consider d/n lats and pecs, continue neck stabilization with UE strength, middle/low trap strength    PT Home Exercise Plan  [email or web address]    Consulted and Agree with Plan of Care  Patient       Patient will benefit from skilled therapeutic intervention in order to improve the following deficits and impairments:     Visit Diagnosis: Radiculopathy, cervical region  Other muscle spasm     Problem List Patient Active Problem List   Diagnosis Date Noted  . Recurrent boils 07/21/2014  . Thrush 07/21/2014  . Hypokalemia 01/07/2014  . Meningitis 01/06/2014  . Rash and nonspecific skin eruption 01/06/2014   5:09 PM,10/09/19 Donita Brooks PT, DPT [facility] [place]  [phone]  [facility] [address] [place], [place], [address] Phone: [phone]   Fax:  [phone]  Name: Kalyn Dimattia. MRN: [id number] Date of Birth: January 29, 1981

## 2019-10-14 ENCOUNTER — Encounter: Payer: Self-pay | Admitting: Physical Therapy

## 2019-10-14 ENCOUNTER — Encounter: Payer: 59 | Admitting: Physical Therapy

## 2019-10-14 ENCOUNTER — Ambulatory Visit: Payer: 59 | Admitting: Physical Therapy

## 2019-10-14 ENCOUNTER — Other Ambulatory Visit: Payer: Self-pay

## 2019-10-14 DIAGNOSIS — M5412 Radiculopathy, cervical region: Secondary | ICD-10-CM

## 2019-10-14 DIAGNOSIS — M62838 Other muscle spasm: Secondary | ICD-10-CM

## 2019-10-14 NOTE — Therapy (Signed)
[facility] [address] [place], [place], [address] Phone: [phone]   Fax:  [phone]  Physical Therapy Treatment  Patient Details  Name: Kyree Adriano. MRN: [id number] Date of Birth: 1981-02-07 Referring Provider (PT): Dibas Dorthy Cooler, MD    Encounter Date: 10/14/2019  PT End of Session - 10/14/19 1659    Visit Number  7    Date for PT Re-Evaluation  10/24/19    Authorization Type  UHC    Authorization Time Period  09/09/19 to 10/24/19    PT Start Time  1618    PT Stop Time  1658    PT Time Calculation (min)  40 min    Activity Tolerance  Patient tolerated treatment well;No increased pain    Behavior During Therapy  WFL for tasks assessed/performed       Past Medical History:  Diagnosis Date  . Migraines   . Seizures ([place])     History reviewed. No pertinent surgical history.  There were no vitals filed for this visit.  Subjective Assessment - 10/14/19 1620    Subjective  Pt states that things have been pretty good over the last couple of days. He has not even had any pinching in his neck when he looks over head.    Currently in Pain?  No/denies    Pain Onset  More than a month ago                       [facility] Adult PT Treatment/Exercise - 10/14/19 0001      Neck Exercises: Seated   Neck Retraction  20 reps;3 secs    Neck Retraction Limitations  yellow TB resistance       Shoulder Exercises: Supine   Other Supine Exercises  B pec fly with #10 2x10 reps over foam roll     Other Supine Exercises  snow angel x20 over foam roll       Shoulder Exercises: Prone   Other Prone Exercises  incline plank on hands/feet serratus press 2x10 reps (2nd set with pike position)       Shoulder Exercises: Standing   Other Standing Exercises  shoulder "V" against wall with red TB around wrist x15 reps       Shoulder Exercises: ROM/Strengthening   Other ROM/Strengthening Exercises  rows #45  2x10    Other ROM/Strengthening Exercises  shoulder shrugs with barbell x10 reps (PT cuing for proper neck position); lat pulldowns 2x10 reps #45              PT Education - 10/14/19 1659    Education Details  updates to HEP; plan for likely d/c next visit    Person(s) Educated  Patient    Methods  Explanation;Handout    Comprehension  Verbalized understanding       PT Short Term Goals - 10/09/19 1624      PT SHORT TERM GOAL #1   Title  Pt will demo consistency and independence with his initial HEP to increased cervical ROM and decrease pain while at work.    Status  Achieved        PT Long Term Goals - 10/09/19 1624      PT LONG TERM GOAL #1   Title  Pt will demo improved active cervical ROM to atleast 50 deg without pain which will allow him to maintain better posture while at work.    Status  Achieved  PT LONG TERM GOAL #2   Title  Pt will  report atleast 60% improvement in his Rt UE symptoms and neck pain from the start of PT which will improve his quality of life.    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    Status  On-going            Plan - 10/14/19 1700    Clinical Impression Statement  Pt continues to progress towards his goals. He reports no pain with active cervical extension over the past week. In addition, he has had little to no Rt shoulder/neck pain throughout the workday. Today's session continued with focus on middle/low trap strength in addition to cervical control with weighted shrug and other activity. Pt demonstrates good awareness of his posture with all activities except for weighted shoulder shrug. This was improved with therapist tactile cuing and he was encouraged to be aware of this at work moving forward.  Due to pt's consistent improvements, we will plan for likely d/c at next visit to ensure there are no further issues with work or updated HEP.    Rehab Potential  Excellent    PT Frequency  2x / week    PT Duration  6 weeks    PT Treatment/Interventions  ADLs/Self Care Home Management;Traction;Moist Heat;Cryotherapy;Therapeutic activities;Therapeutic exercise;Neuromuscular re-education;Patient/family education;Manual techniques;Dry needling;Taping;Spinal Manipulations;Passive range of motion    PT Next Visit Plan  possible d/c    PT Home Exercise Plan  [email or web address]    Consulted and Agree with Plan of Care  Patient       Patient will benefit from skilled therapeutic intervention in order to improve the following deficits and impairments:     Visit Diagnosis: Radiculopathy, cervical region  Other muscle spasm     Problem List Patient Active Problem List   Diagnosis Date Noted  . Recurrent boils 07/21/2014  . Thrush 07/21/2014  . Hypokalemia 01/07/2014  . Meningitis 01/06/2014  . Rash and nonspecific skin eruption 01/06/2014    5:08 PM,10/14/19 Donita Brooks PT, DPT [facility] [place]  [phone]  [facility] [address], [address] [place], [place], [address] Phone: [phone]   Fax:  [phone]  Name: Lux Meaders. MRN: [id number] Date of Birth: July 10, 1981

## 2019-10-14 NOTE — Patient Instructions (Signed)
Access Code: [email or web address]  URL: [email or web address]  Date: 10/14/2019  Prepared by: Sherol Dade   Exercises  Seated Upper Trapezius Stretch - 3 reps - 2 sets - 30 hold - 1x daily - 7x weekly  Seated Assisted Cervical Rotation with Towel - 5 reps - 2 sets - 10 hold - 1x daily - 7x weekly  Cervical Retraction with Resistance - 20 reps - 2 sec hold - 1x daily - 7x weekly  Standing Low Trap Setting with Resistance at [place] 10 reps - 2 sets - 1x daily - 7x weekly  Doorway Pec Stretch at 90 Degrees Abduction - 2 reps - 1 sets - 30 hold - 1x daily - 7x weekly  Prone Chest Stretch on Chair - 2 reps - 10 hold - 1x daily - 7x weekly    [facility] [address], [place], [place] [address] Phone # [phone] Fax [phone]

## 2019-10-21 ENCOUNTER — Ambulatory Visit: Payer: 59 | Admitting: Physical Therapy

## 2019-10-21 ENCOUNTER — Encounter: Payer: 59 | Admitting: Physical Therapy

## 2019-10-21 ENCOUNTER — Other Ambulatory Visit: Payer: Self-pay

## 2019-10-21 ENCOUNTER — Encounter: Payer: Self-pay | Admitting: Physical Therapy

## 2019-10-21 DIAGNOSIS — M62838 Other muscle spasm: Secondary | ICD-10-CM

## 2019-10-21 DIAGNOSIS — M5412 Radiculopathy, cervical region: Secondary | ICD-10-CM | POA: Diagnosis not present

## 2019-10-21 NOTE — Therapy (Addendum)
[facility] [address], [place], [place], [address] Phone: [phone]   Fax:  [phone]  Physical Therapy Treatment/Discharge  Patient Details  Name: Randy [name]. MRN: [id number] Date of Birth: December 21, 1981 Referring Provider (PT): Dibas Dorthy Cooler, MD    Encounter Date: 10/21/2019  PT End of Session - 10/21/19 1625    Visit Number  8    Date for PT Re-Evaluation  10/24/19    Authorization Type  UHC    Authorization Time Period  09/09/19 to 10/24/19    PT Start Time  1622    PT Stop Time  1700    PT Time Calculation (min)  38 min    Activity Tolerance  Patient tolerated treatment well;No increased pain    Behavior During Therapy  WFL for tasks assessed/performed       Past Medical History:  Diagnosis Date  . Migraines   . Seizures ([place])     History reviewed. No pertinent surgical history.  There were no vitals filed for this visit.  Subjective Assessment - 10/21/19 1626    Subjective  Patient reports no sx since last visit.    Currently in Pain?  No/denies                       [facility] Adult PT Treatment/Exercise - 10/21/19 0001      Neck Exercises: Machines for Strengthening   UBE (Upper Arm Bike)  Seat 10 L5 x2.5 min forward, 1.5 min backward (PT present to discuss progress)       Shoulder Exercises: Supine   Other Supine Exercises  B pec fly with #10 2x10 reps over foam roll     Other Supine Exercises  snow angel x20 over foam roll       Shoulder Exercises: Standing   Theraband Level (Shoulder Row)  Other (comment)    Row Limitations  return demo of rows with black band x 5    Other Standing Exercises  shoulder "V" against wall with red TB around wrist x15 reps     Other Standing Exercises  return demo with black band for lat pull in door x 5      Shoulder Exercises: ROM/Strengthening   Cybex Row Limitations  bent over row with 25# x 10 ea bil    Other ROM/Strengthening  Exercises  rows #45 2x10    Other ROM/Strengthening Exercises  shoulder shrugs with 25# ea x10 reps; lat pulldowns 2x10 reps #45              PT Education - 10/21/19 1701    Education Details  HEP finalized    Person(s) Educated  Patient    Methods  Explanation;Demonstration;Handout;Verbal cues    Comprehension  Verbalized understanding;Returned demonstration       PT Short Term Goals - 10/21/19 1626      PT SHORT TERM GOAL #1   Title  Pt will demo consistency and independence with his initial HEP to increased cervical ROM and decrease pain while at work.    Status  Achieved        PT Long Term Goals - 10/21/19 1628      PT LONG TERM GOAL #1   Title  Pt will demo improved active cervical ROM to atleast 50 deg without pain which will allow him to maintain better posture while at work.    Status  Achieved      PT LONG TERM GOAL #2  Title  Pt will  report atleast 60% improvement in his Rt UE symptoms and neck pain from the start of PT which will improve his quality of life.    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    Status  Partially Met            Plan - 10/21/19 1701    Clinical Impression Statement  Patient presents today with now c/o pain since last visit and reporting 70-80% improvement. HEP was progressed and patient to be put on hold for 2 weeks. Plan to d/c if he does not return.    PT Frequency  2x / week    PT Duration  6 weeks    PT Treatment/Interventions  ADLs/Self Care Home Management;Traction;Moist Heat;Cryotherapy;Therapeutic activities;Therapeutic exercise;Neuromuscular re-education;Patient/family education;Manual techniques;Dry needling;Taping;Spinal Manipulations;Passive range of motion    PT Next Visit Plan  on hold  until 11/04/19, then d/c.    PT Home Exercise Plan  [email or web address]    Consulted and Agree with Plan of Care  Patient       Patient will benefit from skilled therapeutic intervention in order to improve the following deficits and impairments:  Decreased activity tolerance, Decreased strength, Impaired flexibility, Decreased range of motion, Increased muscle spasms, Hypomobility, Postural dysfunction, Pain, Improper body mechanics  Visit Diagnosis: Radiculopathy, cervical region  Other muscle spasm     Problem List Patient Active Problem List   Diagnosis Date Noted  . Recurrent boils 07/21/2014  . Thrush 07/21/2014  . Hypokalemia 01/07/2014  . Meningitis 01/06/2014  . Rash and nonspecific skin eruption 01/06/2014    Madelyn Flavors PT 10/21/2019, 5:08 PM  [facility] [address] [place], [place], [address] Phone: [phone]   Fax:  [phone]  Name: Randy [name]. MRN: [id number] Date of Birth: 25-Feb-1981    *addendum to resolve episode of care and d/c pt from PT  [place]  Visits from Start of Care: 8  Current functional level related to goals / functional outcomes: See above for more details    Remaining deficits: See above for more details    Education / Equipment: See above for more details  Plan: Patient agrees to discharge.  Patient goals were met. Patient is being discharged due to meeting the stated rehab goals.  ?????        2:21 PM,11/11/19 [place], [place] at [place]

## 2019-10-21 NOTE — Patient Instructions (Signed)
Access Code: [email or web address]  URL: [email or web address]  Date: 10/21/2019  Prepared by: Almyra Free Tysean Vand[place]liet   Exercises Seated Upper Trapezius Stretch - 3 reps - 2 sets - 30 hold - 1x daily - 7x weekly Seated Assisted Cervical Rotation with Towel - 5 reps - 2 sets - 10 hold - 1x daily - 7x weekly Cervical Retraction with Resistance - 20 reps - 2 sec hold - 1x daily - 7x weekly Standing Low Trap Setting with Resistance at [place] - 10 reps - 2 sets - 1x daily - 7x weekly Doorway Pec Stretch at 90 Degrees Abduction - 2 reps - 1 sets - 30 hold - 1x daily - 7x weekly Prone Chest Stretch on Chair - 2 reps - 10 hold - 1x daily - 7x weekly Standing Lat Pull Down with Resistance - Elbows Bent - 10 reps - 3 sets - 1x daily - 3x weekly Standing Row with Anchored Resistance - 10 reps - 3 sets - 1x daily - 3x weekly Standing Bent Over Single Arm Scapular Row with Table Support - 10 reps - 3 sets - 1x daily - 3x weekly Standing Shoulder Shrugs with Dumbbells - 10 reps - 3 sets - 1x daily - 3x weekly

## 2022-07-04 ENCOUNTER — Other Ambulatory Visit: Payer: Self-pay

## 2022-07-04 ENCOUNTER — Emergency Department (HOSPITAL_COMMUNITY)
Admission: EM | Admit: 2022-07-04 | Discharge: 2022-07-04 | Disposition: A | Discharge status: Home / Self Care | Payer: 59 | Attending: Emergency Medicine | Admitting: Emergency Medicine

## 2022-07-04 ENCOUNTER — Encounter (HOSPITAL_COMMUNITY): Payer: Self-pay | Admitting: Emergency Medicine

## 2022-07-04 DIAGNOSIS — G510 Bell's palsy: Secondary | ICD-10-CM | POA: Insufficient documentation

## 2022-07-04 DIAGNOSIS — R799 Abnormal finding of blood chemistry, unspecified: Secondary | ICD-10-CM | POA: Insufficient documentation

## 2022-07-04 LAB — CBG MONITORING, ED: Glucose-Capillary: 119 mg/dL — ABNORMAL HIGH (ref 70–99)

## 2022-07-04 MED ORDER — FLUORESCEIN SODIUM 1 MG OP STRP
1.0000 | ORAL_STRIP | Freq: Once | OPHTHALMIC | Status: AC
  Administered 2022-07-04: 1 via OPHTHALMIC
  Filled 2022-07-04: qty 1

## 2022-07-04 MED ORDER — DEXAMETHASONE 4 MG PO TABS
8.0000 mg | ORAL_TABLET | Freq: Once | ORAL | Status: AC
  Administered 2022-07-04: 8 mg via ORAL
  Filled 2022-07-04: qty 2

## 2022-07-04 MED ORDER — ACYCLOVIR 400 MG PO TABS: 400.0000 mg | ORAL_TABLET | Freq: Every day | ORAL | 0 refills | Status: AC

## 2022-07-04 MED ORDER — DOXYCYCLINE HYCLATE 100 MG PO CAPS: 200.0000 mg | ORAL_CAPSULE | Freq: Once | ORAL | 0 refills | Status: AC

## 2022-07-04 MED ORDER — TETRACAINE HCL 0.5 % OP SOLN
2.0000 [drp] | Freq: Once | OPHTHALMIC | Status: AC
  Administered 2022-07-04: 2 [drp] via OPHTHALMIC
  Filled 2022-07-04: qty 4

## 2022-07-04 MED ORDER — PREDNISONE 20 MG PO TABS: 60.0000 mg | ORAL_TABLET | Freq: Every day | ORAL | 0 refills | Status: AC

## 2022-07-04 MED ORDER — SYSTANE BALANCE 0.6 % OP SOLN: 1.0000 [drp] | OPHTHALMIC | 0 refills | Status: AC

## 2022-07-04 NOTE — ED Triage Notes (Signed)
Pt reports right sided facial droop, neck pain and headache that began yesterday.

## 2022-07-04 NOTE — ED Provider Notes (Signed)
[facility]-EMERGENCY DEPT Provider Note   CSN: [id number] Arrival date & time: 07/04/22  1440     History {Add pertinent medical, surgical, social history, OB history to HPI:1} Chief Complaint  Patient presents with   Headache   Facial Droop    Randy [name]. is a 41 y.o. male.  Patient as above with significant medical history as below, including *** who presents to the ED with complaint of ***     Past Medical History:  Diagnosis Date   Migraines    Seizures ([name])     History reviewed. No pertinent surgical history.    Headache      Home Medications Prior to Admission medications   Medication Sig Start Date End Date Taking? Authorizing Provider  Aspirin-Salicylamide-Caffeine (BC HEADACHE POWDER PO) Take 1 packet by mouth daily as needed (pain).    [provider]  doxycycline (VIBRAMYCIN) 100 MG capsule Take 1 capsule (100 mg total) by mouth 2 (two) times daily. Patient not taking: Reported on 04/20/2017 07/21/14   Sherren Mocha, MD  fluconazole (DIFLUCAN) 100 MG tablet Take 1 tablet (100 mg total) by mouth once. Take 2 tabs po x 1, then start 1 tab po qd Patient not taking: Reported on 04/20/2017 07/21/14   Sherren Mocha, MD  hydrOXYzine (ATARAX/VISTARIL) 25 MG tablet Take 0.5-1 tablets (12.5-25 mg total) by mouth every 8 (eight) hours as needed for itching. Patient not taking: Reported on 04/20/2017 07/21/14   Sherren Mocha, MD  mupirocin ointment (BACTROBAN) 2 % Apply 1 application topically 3 (three) times daily. Patient not taking: Reported on 04/20/2017 07/21/14   Sherren Mocha, MD  triamcinolone cream (KENALOG) 0.1 % Apply 1 application topically 2 (two) times daily. Patient not taking: Reported on 04/20/2017 07/21/14   Sherren Mocha, MD      Allergies    Patient has no known allergies.    Review of Systems   Review of Systems  Neurological:  Positive for headaches.    Physical Exam Updated Vital Signs BP (!) 185/111 (BP Location:  Left Arm)   Pulse 82   Temp 98 F (36.7 C) (Oral)   Resp 18   SpO2 99%  Physical Exam  ED Results / Procedures / Treatments   Labs (all labs ordered are listed, but only abnormal results are displayed) Labs Reviewed - No data to display  EKG None  Radiology No results found.  Procedures Procedures  {Document cardiac monitor, telemetry assessment procedure when appropriate:1}  Medications Ordered in ED Medications  dexamethasone (DECADRON) tablet 8 mg (has no administration in time range)  fluorescein ophthalmic strip 1 strip (has no administration in time range)  tetracaine (PONTOCAINE) 0.5 % ophthalmic solution 2 drop (has no administration in time range)    ED Course/ Medical Decision Making/ A&P                           Medical Decision Making Risk Prescription drug management.    CC: ***  This patient presents to the Emergency Department for the above complaint. This involves an extensive number of treatment options and is a complaint that carries with it a high risk of complications and morbidity. Vital signs were reviewed. Serious etiologies considered.  Record review:  Previous records obtained and reviewed ***  Additional history obtained from ***  Medical and surgical history as noted above.   Work up as above, notable for:  [employer]  imaging results that were available during my care of the patient were visualized by me and considered in my medical decision making.  Physical exam as above.   I ordered imaging studies which included ***. I visualized the imaging, interpreted images, and I agree with radiologist interpretation. ***  Cardiac monitoring reviewed and interpreted personally which shows ***   Personally discussed patient care with consultant; ***  Management: ***  ED Course:     Reassessment:  ***  Admission was considered.                Social determinants of health include -  Social History   Socioeconomic  History   Marital status: Single    Spouse name: Not on file   Number of children: Not on file   Years of education: Not on file   Highest education level: Not on file  Occupational History   Not on file  Tobacco Use   Smoking status: Every Day    Types: Cigarettes   Smokeless tobacco: Not on file  Substance and Sexual Activity   Alcohol use: Yes   Drug use: No   Sexual activity: Not on file  Other Topics Concern   Not on file  Social History Narrative   Not on file   Social Determinants of Health   Financial Resource Strain: Not on file  Food Insecurity: Not on file  Transportation Needs: Not on file  Physical Activity: Not on file  Stress: Not on file  Social Connections: Not on file  Intimate Partner Violence: Not on file      This chart was dictated using voice recognition software.  Despite best efforts to proofread,  errors can occur which can change the documentation meaning.   {Document critical care time when appropriate:1} {Document review of labs and clinical decision tools ie heart score, Chads2Vasc2 etc:1}  {Document your independent review of radiology images, and any outside records:1} {Document your discussion with family members, caretakers, and with consultants:1} {Document social determinants of health affecting pt's care:1} {Document your decision making why or why not admission, treatments were needed:1} Final Clinical Impression(s) / ED Diagnoses Final diagnoses:  None    Rx / DC Orders ED Discharge Orders     None

## 2022-07-04 NOTE — Discharge Instructions (Addendum)
Please use rewetting drops to keep your right eye moist, please tape your right eyelid shut at nighttime.  Please use eye protection/safety glasses when needed.  Please follow-up with ophthalmology  Please follow-up with PCP   It was a pleasure caring for you today in the emergency department.  Please return to the emergency department for any worsening or worrisome symptoms.

## 2023-02-09 ENCOUNTER — Emergency Department
Admission: EM | Admit: 2023-02-09 | Discharge: 2023-02-09 | Disposition: A | Discharge status: Home / Self Care | Payer: Self-pay | Attending: Emergency Medicine | Admitting: Emergency Medicine

## 2023-02-09 ENCOUNTER — Emergency Department: Payer: Self-pay

## 2023-02-09 DIAGNOSIS — K4030 Unilateral inguinal hernia, with obstruction, without gangrene, not specified as recurrent: Secondary | ICD-10-CM | POA: Insufficient documentation

## 2023-02-09 DIAGNOSIS — R590 Localized enlarged lymph nodes: Secondary | ICD-10-CM | POA: Insufficient documentation

## 2023-02-09 LAB — COMPREHENSIVE METABOLIC PANEL
ALT: 31 U/L (ref 0–44)
AST: 34 U/L (ref 15–41)
Albumin: 4.3 g/dL (ref 3.5–5.0)
Alkaline Phosphatase: 95 U/L (ref 38–126)
Anion gap: 11 (ref 5–15)
BUN: 10 mg/dL (ref 6–20)
CO2: 25 mmol/L (ref 22–32)
Calcium: 9.4 mg/dL (ref 8.9–10.3)
Chloride: 100 mmol/L (ref 98–111)
Creatinine, Ser: 1.14 mg/dL (ref 0.61–1.24)
GFR, Estimated: >60 mL/min (ref >60)
Glucose, Bld: 106 mg/dL — ABNORMAL HIGH (ref 70–99)
Potassium: 3.8 mmol/L (ref 3.5–5.1)
Sodium: 136 mmol/L (ref 135–145)
Total Bilirubin: 1.1 mg/dL (ref 0.3–1.2)
Total Protein: 7.8 g/dL (ref 6.5–8.1)

## 2023-02-09 LAB — URINALYSIS, ROUTINE W REFLEX MICROSCOPIC
Bacteria, UA: NONE SEEN
Bilirubin Urine: NEGATIVE
Glucose, UA: NEGATIVE mg/dL
Hgb urine dipstick: NEGATIVE
Ketones, ur: NEGATIVE mg/dL
Leukocytes,Ua: NEGATIVE
Nitrite: NEGATIVE
Protein, ur: 30 mg/dL — AB
Specific Gravity, Urine: 1.018 (ref 1.005–1.030)
Squamous Epithelial / HPF: NONE SEEN /HPF (ref 0–5)
pH: 6 (ref 5.0–8.0)

## 2023-02-09 LAB — CBC
HCT: 46.9 % (ref 39.0–52.0)
Hemoglobin: 15.4 g/dL (ref 13.0–17.0)
MCH: 28.1 pg (ref 26.0–34.0)
MCHC: 32.8 g/dL (ref 30.0–36.0)
MCV: 85.4 fL (ref 80.0–100.0)
Platelets: 280 10*3/uL (ref 150–400)
RBC: 5.49 MIL/uL (ref 4.22–5.81)
RDW: 14.6 % (ref 11.5–15.5)
WBC: 11.9 10*3/uL — ABNORMAL HIGH (ref 4.0–10.5)
nRBC: 0 % (ref 0.0–0.2)

## 2023-02-09 LAB — CHLAMYDIA/NGC RT PCR (ARMC ONLY)
Chlamydia Tr: NOT DETECTED
N gonorrhoeae: NOT DETECTED

## 2023-02-09 SURGERY — REPAIR, HERNIA, INGUINAL, ROBOT-ASSISTED, LAPAROSCOPIC, USING MESH
Anesthesia: General | Laterality: Left

## 2023-02-09 MED ORDER — IOHEXOL 300 MG/ML  SOLN
100.0000 mL | Freq: Once | INTRAMUSCULAR | Status: AC | PRN
  Administered 2023-02-09: 100 mL via INTRAVENOUS

## 2023-02-09 MED ORDER — TRAMADOL HCL 50 MG PO TABS: 50.0000 mg | ORAL_TABLET | Freq: Four times a day (QID) | ORAL | 0 refills | Status: DC | PRN

## 2023-02-09 MED ORDER — ONDANSETRON HCL 4 MG/2ML IJ SOLN
4.0000 mg | Freq: Once | INTRAMUSCULAR | Status: DC
  Filled 2023-02-09: qty 2

## 2023-02-09 MED ORDER — HYDROMORPHONE HCL 1 MG/ML IJ SOLN
1.0000 mg | Freq: Once | INTRAMUSCULAR | Status: DC
  Filled 2023-02-09: qty 1

## 2023-02-09 MED ORDER — DOXYCYCLINE HYCLATE 100 MG PO TABS: 100.0000 mg | ORAL_TABLET | Freq: Two times a day (BID) | ORAL | 0 refills | Status: DC

## 2023-02-09 MED ORDER — DOXYCYCLINE HYCLATE 100 MG PO TABS
100.0000 mg | ORAL_TABLET | Freq: Once | ORAL | Status: AC
  Administered 2023-02-09: 100 mg via ORAL
  Filled 2023-02-09: qty 1

## 2023-02-09 NOTE — ED Triage Notes (Signed)
Pt to ED via POV from home. Pt reports left sided groin pain that started "a while ago". Pt denies swelling pain to penis or testicles. Pt denies urinary symptoms. Pt denies hx kidney stones.

## 2023-02-09 NOTE — Discharge Instructions (Addendum)
As we discussed please call the number provided for hematology/oncology on Monday morning to arrange a follow-up appointment soon as possible for further evaluation of the very large lymph node to help rule out any cancerous cause for the enlargement.  Please complete your antibiotics twice daily for the next 10 days.  Return to the emergency department for any symptom concerning to yourself.  You may take your pain medication as needed but only as prescribed.  Do not drink alcohol or drive while taking this medication.

## 2023-02-09 NOTE — ED Provider Notes (Signed)
[facility] Provider Note    Event Date/Time   First MD Initiated Contact with Patient 02/09/23 1804     (approximate)  History   Chief Complaint: Groin Pain  HPI  Randy [name]. is a 42 y.o. male with a past medical history of migraines and seizure presents to the emergency department for painful bulging to his left groin.  According to the patient for the past year or 2 he has noticed an intermittent bulging to the left groin states usually goes away on its own however over the past 5 days patient states the bulging has been present he has been unable to reduce it and has not been going away at night.  Over the past 12 hours or so it has become painful.  Patient states for the last 2 days he is also been constipated.  No vomiting.  Physical Exam   Triage Vital Signs: ED Triage Vitals [02/09/23 1758]  Enc Vitals Group     BP (!) 166/104     Pulse Rate (!) 109     Resp 18     Temp 98.4 F (36.9 C)     Temp Source Oral     SpO2 97 %     Weight      Height      Head Circumference      Peak Flow      Pain Score 3     Pain Loc      Pain Edu?      Excl. in [place]?     Most recent vital signs: Vitals:   02/09/23 1758  BP: (!) 166/104  Pulse: (!) 109  Resp: 18  Temp: 98.4 F (36.9 C)  SpO2: 97%    General: Awake, no distress.  CV:  Good peripheral perfusion.  Regular rate and rhythm  Resp:  Normal effort.  Equal breath sounds bilaterally.  Abd:  No distention.  Patient has significant swelling over the inguinal canal on the left side tender to palpation.   ED Results / Procedures / Treatments   RADIOLOGY  CT pending   MEDICATIONS ORDERED IN ED: Medications  HYDROmorphone (DILAUDID) injection 1 mg (has no administration in time range)  ondansetron (ZOFRAN) injection 4 mg (has no administration in time range)     IMPRESSION / MDM / ASSESSMENT AND PLAN / ED COURSE  I reviewed the triage vital signs and the nursing  notes.  Patient's presentation is most consistent with acute presentation with potential threat to life or bodily function.  Patient presents emergency department for swelling to left groin.  Patient states intermittent swelling for the past year or more however over the past 5 days it has been swollen/bulging and has not gone down over the past 24 hours it is now become painful.  Patient states very minimal bowel movement yesterday essentially constipated for 2 to 3 days per patient.  Examination is consistent with an incarcerated left inguinal hernia.  Attempted reduction without success.  Give the patient pain medication iced for 20 minutes and placed in a supine Trendelenburg position still unable to reduce.  We will check labs obtain CT imaging.  I discussed the patient with Dr. Christian Mate, CT is pending currently.  Urinalysis shows no concerning findings.  CBC is normal, chemistry is normal.  CT has resulted showing appears to be massive lymphadenopathy as a cause for the swelling.  Patient denies any urethral discharge denies any new sexual partners.  We will add on  a GC/chlamydia as a precaution and treat with doxycycline as a precaution.  However given the size of the lymph node I discussed with the patient the need to follow-up with hematology/oncology for consideration of a fine-needle biopsy of the lymph node to rule out any cancerous causes.  Patient is agreeable to this plan of care.  FINAL CLINICAL IMPRESSION(S) / ED DIAGNOSES   Inguinal lymphadenopathy    Note:  This document was prepared using Dragon voice recognition software and may include unintentional dictation errors.   Harvest Dark, MD 02/09/23 2001

## 2023-02-12 ENCOUNTER — Other Ambulatory Visit: Payer: Self-pay

## 2023-02-12 ENCOUNTER — Ambulatory Visit: Payer: Self-pay | Admitting: Oncology

## 2023-02-12 ENCOUNTER — Telehealth: Payer: Self-pay

## 2023-02-12 NOTE — Telephone Encounter (Signed)
Introductory phone call made to patient prior to his new patient appointment with Dr. Tasia Catchings on 02/13/23. Patient did not answer so voicemail was left with directions to cancer center from main entrance.

## 2023-02-13 ENCOUNTER — Inpatient Hospital Stay: Payer: Self-pay

## 2023-02-13 ENCOUNTER — Encounter: Payer: Self-pay | Admitting: Oncology

## 2023-02-13 ENCOUNTER — Inpatient Hospital Stay: Payer: Self-pay | Attending: Oncology | Admitting: Oncology

## 2023-02-13 ENCOUNTER — Telehealth: Payer: Self-pay

## 2023-02-13 VITALS — BP 171/111 | HR 82 | Temp 96.4°F | Resp 18 | Wt 260.2 lb

## 2023-02-13 DIAGNOSIS — Z0000 Encounter for general adult medical examination without abnormal findings: Secondary | ICD-10-CM

## 2023-02-13 DIAGNOSIS — R590 Localized enlarged lymph nodes: Secondary | ICD-10-CM

## 2023-02-13 DIAGNOSIS — C8475 Anaplastic large cell lymphoma, ALK-negative, lymph nodes of inguinal region and lower limb: Secondary | ICD-10-CM | POA: Insufficient documentation

## 2023-02-13 DIAGNOSIS — Z87891 Personal history of nicotine dependence: Secondary | ICD-10-CM | POA: Insufficient documentation

## 2023-02-13 DIAGNOSIS — R1909 Other intra-abdominal and pelvic swelling, mass and lump: Secondary | ICD-10-CM | POA: Insufficient documentation

## 2023-02-13 DIAGNOSIS — C8470 Anaplastic large cell lymphoma, ALK-negative, unspecified site: Secondary | ICD-10-CM | POA: Insufficient documentation

## 2023-02-13 LAB — CBC WITH DIFFERENTIAL/PLATELET
Abs Immature Granulocytes: 0.02 10*3/uL (ref 0.00–0.07)
Basophils Absolute: 0 10*3/uL (ref 0.0–0.1)
Basophils Relative: 1 %
Eosinophils Absolute: 0.2 10*3/uL (ref 0.0–0.5)
Eosinophils Relative: 3 %
HCT: 45.7 % (ref 39.0–52.0)
Hemoglobin: 15 g/dL (ref 13.0–17.0)
Immature Granulocytes: 0 %
Lymphocytes Relative: 25 %
Lymphs Abs: 1.7 10*3/uL (ref 0.7–4.0)
MCH: 28.5 pg (ref 26.0–34.0)
MCHC: 32.8 g/dL (ref 30.0–36.0)
MCV: 86.7 fL (ref 80.0–100.0)
Monocytes Absolute: 0.8 10*3/uL (ref 0.1–1.0)
Monocytes Relative: 12 %
Neutro Abs: 4 10*3/uL (ref 1.7–7.7)
Neutrophils Relative %: 59 %
Platelets: 304 10*3/uL (ref 150–400)
RBC: 5.27 MIL/uL (ref 4.22–5.81)
RDW: 14.2 % (ref 11.5–15.5)
WBC: 6.8 10*3/uL (ref 4.0–10.5)
nRBC: 0 % (ref 0.0–0.2)

## 2023-02-13 LAB — COMPREHENSIVE METABOLIC PANEL
ALT: 39 U/L (ref 0–44)
AST: 34 U/L (ref 15–41)
Albumin: 4.3 g/dL (ref 3.5–5.0)
Alkaline Phosphatase: 110 U/L (ref 38–126)
Anion gap: 10 (ref 5–15)
BUN: 11 mg/dL (ref 6–20)
CO2: 25 mmol/L (ref 22–32)
Calcium: 9.4 mg/dL (ref 8.9–10.3)
Chloride: 104 mmol/L (ref 98–111)
Creatinine, Ser: 1.11 mg/dL (ref 0.61–1.24)
GFR, Estimated: >60 mL/min (ref >60)
Glucose, Bld: 96 mg/dL (ref 70–99)
Potassium: 4 mmol/L (ref 3.5–5.1)
Sodium: 139 mmol/L (ref 135–145)
Total Bilirubin: 0.7 mg/dL (ref 0.3–1.2)
Total Protein: 8.4 g/dL — ABNORMAL HIGH (ref 6.5–8.1)

## 2023-02-13 LAB — LACTATE DEHYDROGENASE: LDH: 206 U/L — ABNORMAL HIGH (ref 98–192)

## 2023-02-13 LAB — HIV ANTIBODY (ROUTINE TESTING W REFLEX): HIV Screen 4th Generation wRfx: NONREACTIVE

## 2023-02-13 NOTE — Assessment & Plan Note (Signed)
Imaging findings were reviewed and discussed with patient. Recommend lab work with CBC, CMP, flow cytometry, multiple myeloma, HIV, hepatitis panel, EBV, CMV, HSV PCR.  Recommend ultrasound-guided biopsy of left inguinal lymphadenopathy.  Future workup plan depending on biopsy and lab results.

## 2023-02-13 NOTE — Assessment & Plan Note (Signed)
I highly recommend patient to establish care with primary care provider.  A list of local primary care provider office information was provided to patient.

## 2023-02-13 NOTE — Progress Notes (Signed)
Patient is referred here by Dr. Kerman Passey for lymphadenopathy and consideration of fine needle biopsy.

## 2023-02-13 NOTE — Progress Notes (Signed)
Hematology/Oncology Consult Note [phone] [phone]     REFERRING PROVIDER: Harvest Dark, MD    CHIEF COMPLAINTS/PURPOSE OF CONSULTATION:  Left inguinal mass  ASSESSMENT & PLAN:   Inguinal lymphadenopathy Imaging findings were reviewed and discussed with patient. Recommend lab work with CBC, CMP, flow cytometry, multiple myeloma, HIV, hepatitis panel, EBV, CMV, HSV PCR.  Recommend ultrasound-guided biopsy of left inguinal lymphadenopathy.  Future workup plan depending on biopsy and lab results.  Healthcare maintenance I highly recommend patient to establish care with primary care provider.  A list of local primary care provider office information was provided to patient.   Orders Placed This Encounter  Procedures   [place] CORE BIOPSY (LYMPH NODES)    Standing Status:   Future    Standing Expiration Date:   02/14/2024    Order Specific Question:   Lab orders requested (DO NOT place separate lab orders, these will be automatically ordered during procedure specimen collection):    Answer:   Surgical Pathology    Order Specific Question:   Reason for Exam (SYMPTOM  OR DIAGNOSIS REQUIRED)    Answer:   US guided biopsy of left inguinal mass biopsy.    Order Specific Question:   Preferred location?    Answer:   [facility]   CBC with Differential/Platelet    Standing Status:   Future    Number of Occurrences:   1    Standing Expiration Date:   02/14/2024   Comprehensive metabolic panel    Standing Status:   Future    Number of Occurrences:   1    Standing Expiration Date:   02/14/2024   Flow cytometry panel-leukemia/lymphoma work-up    Standing Status:   Future    Number of Occurrences:   1    Standing Expiration Date:   02/14/2024   Multiple Myeloma Panel (SPEP&IFE w/QIG)    Standing Status:   Future    Number of Occurrences:   1    Standing Expiration Date:   02/14/2024   Kappa/lambda light chains    Standing Status:   Future    Number of  Occurrences:   1    Standing Expiration Date:   02/14/2024   HIV Antibody (routine testing w rflx)    Standing Status:   Future    Number of Occurrences:   1    Standing Expiration Date:   02/14/2024   Hepatitis panel, acute    Standing Status:   Future    Number of Occurrences:   1    Standing Expiration Date:   02/14/2024   Lactate dehydrogenase    Standing Status:   Future    Number of Occurrences:   1    Standing Expiration Date:   02/14/2024   Herpes simplex virus(hsv) dna by pcr    Standing Status:   Future    Number of Occurrences:   1    Standing Expiration Date:   02/14/2024   Epstein barr vrs(ebv dna by pcr)    Standing Status:   Future    Number of Occurrences:   1    Standing Expiration Date:   02/14/2024   CMV DNA, quantitative, PCR    Standing Status:   Future    Number of Occurrences:   1    Standing Expiration Date:   02/14/2024    All questions were answered. The patient knows to call the clinic with any problems, questions or concerns.  Earlie Server, MD, PhD [facility] Oncology 02/13/2023    HISTORY OF PRESENTING ILLNESS:  Randy [name]. 42 y.o. male presents to establish care for left inguinal mass. Patient presented to emergency room on 02/09/2023 complaining about painful bulging to his left groin. Patient has noticed left inguinal mass for at least a year, mass is gradually enlarging, some tenderness. 02/09/2023 CT abdomen pelvis with contrast showed massive lymphadenopathy of left inguinal groin, largest measures 6.1 x 5.5 x 4.4 cm  [place] fat containing left inguinal hernia.  5 mm left solid pulmonary nodule.  Urine was negative for chlamydia and gonorrhea  patient was given empiric antibiotics and referred to establish care with oncology for further evaluation.  Patient denies any unintentional weight loss, fever, night sweats.  He reports having " breakout of genital herpes" recently.  He does not have primary care provider.  He reports that he  was told that he has genital herpes many years ago.  He is a poor historian and not able to provide detailed history. Reviewed previous medical history. 01/08/2014-01/08/2021, patient presented emergency room for headache,.  LP was done which showed lymphocytic predominant pleocytosis, HSV 2+, RPR reactive 1:4, HIV and crypto Ag negative.  He was treated with HSV meningitis with acyclovir.  Patient also was treated for skin eruptions secondary syphilis with penicillin injections.   MEDICAL HISTORY:  Past Medical History:  Diagnosis Date   Migraines    Seizures ([place])     SURGICAL HISTORY: History reviewed. No pertinent surgical history.  SOCIAL HISTORY: Social History   Socioeconomic History   Marital status: Single    Spouse name: Not on file   Number of children: Not on file   Years of education: Not on file   Highest education level: Not on file  Occupational History   Not on file  Tobacco Use   Smoking status: Former    Years: 15.00    Types: Cigarettes    Quit date: 03/25/2022    Years since quitting: 0.8   Smokeless tobacco: Not on file  Vaping Use   Vaping Use: Every day   Start date: 03/27/2023  Substance and Sexual Activity   Alcohol use: Yes   Drug use: No   Sexual activity: Not on file  Other Topics Concern   Not on file  Social History Narrative   Not on file   Social Determinants of Health   Financial Resource Strain: Low Risk  (02/13/2023)   Overall Financial Resource Strain (CARDIA)    Difficulty of Paying Living Expenses: Not hard at all  Food Insecurity: No Food Insecurity (02/13/2023)   Hunger Vital Sign    Worried About Running Out of Food in the Last Year: Never true    [place] in the Last Year: Never true  Transportation Needs: No Transportation Needs (02/13/2023)   PRAPARE - Hydrologist (Medical): No    Lack of Transportation (Non-Medical): No  Physical Activity: Not on file  Stress: No Stress Concern Present  (02/13/2023)   [place]    Feeling of Stress : Not at all  Social Connections: Not on file  Intimate Partner Violence: Not At Risk (02/13/2023)   Humiliation, Afraid, Rape, and Kick questionnaire    Fear of Current or Ex-Partner: No    Emotionally Abused: No    Physically Abused: No    Sexually Abused: No    FAMILY HISTORY: Family History  Problem Relation Age  of Onset   Stroke Father    Diabetes Father     ALLERGIES:  has No Known Allergies.  MEDICATIONS:  Current Outpatient Medications  Medication Sig Dispense Refill   doxycycline (VIBRA-TABS) 100 MG tablet Take 1 tablet (100 mg total) by mouth 2 (two) times daily. 20 tablet 0   traMADol (ULTRAM) 50 MG tablet Take 1 tablet (50 mg total) by mouth every 6 (six) hours as needed. 10 tablet 0   Aspirin-Salicylamide-Caffeine (BC HEADACHE POWDER PO) Take 1 packet by mouth daily as needed (pain). (Patient not taking: Reported on 02/13/2023)     fluconazole (DIFLUCAN) 100 MG tablet Take 1 tablet (100 mg total) by mouth once. Take 2 tabs po x 1, then start 1 tab po qd (Patient not taking: Reported on 04/20/2017) 31 tablet 0   hydrOXYzine (ATARAX/VISTARIL) 25 MG tablet Take 0.5-1 tablets (12.5-25 mg total) by mouth every 8 (eight) hours as needed for itching. (Patient not taking: Reported on 04/20/2017) 30 tablet 0   mupirocin ointment (BACTROBAN) 2 % Apply 1 application topically 3 (three) times daily. (Patient not taking: Reported on 04/20/2017) 30 g 1   triamcinolone cream (KENALOG) 0.1 % Apply 1 application topically 2 (two) times daily. (Patient not taking: Reported on 04/20/2017) 30 g 0   No current facility-administered medications for this visit.    Review of Systems  Constitutional:  Negative for appetite change, chills, fatigue, fever and unexpected weight change.  HENT:   Negative for hearing loss and voice change.   Eyes:  Negative for eye problems and icterus.   Respiratory:  Negative for chest tightness, cough and shortness of breath.   Cardiovascular:  Negative for chest pain and leg swelling.  Gastrointestinal:  Negative for abdominal distention and abdominal pain.  Endocrine: Negative for hot flashes.  Genitourinary:  Negative for difficulty urinating, dysuria and frequency.        Left inguinal mass  Musculoskeletal:  Negative for arthralgias.  Skin:  Negative for itching and rash.  Neurological:  Negative for light-headedness and numbness.  Hematological:  Negative for adenopathy. Does not bruise/bleed easily.  Psychiatric/Behavioral:  Negative for confusion.      PHYSICAL EXAMINATION: ECOG PERFORMANCE STATUS: 0 - Asymptomatic  Vitals:   02/13/23 1119  BP: (!) 171/111  Pulse: 82  Resp: 18  Temp: (!) 96.4 F (35.8 C)  SpO2: 100%   Filed Weights   02/13/23 1119  Weight: 260 lb 3.2 oz (118 kg)    Physical Exam Constitutional:      General: He is not in acute distress.    Appearance: He is not diaphoretic.  HENT:     Head: Normocephalic and atraumatic.     Nose: Nose normal.     Mouth/Throat:     Pharynx: No oropharyngeal exudate.  Eyes:     General: No scleral icterus.    Pupils: Pupils are equal, round, and reactive to light.  Cardiovascular:     Rate and Rhythm: Normal rate and regular rhythm.     Heart sounds: No murmur heard. Pulmonary:     Effort: Pulmonary effort is normal. No respiratory distress.     Breath sounds: No rales.  Chest:     Chest wall: No tenderness.  Abdominal:     General: There is no distension.     Palpations: Abdomen is soft.     Tenderness: There is no abdominal tenderness.  Genitourinary:    Comments: Left inguinal massive lymphadenopathy, fixed No genital ulcer or sores Musculoskeletal:  General: Normal range of motion.     Cervical back: Normal range of motion and neck supple.  Skin:    General: Skin is warm and dry.     Findings: No erythema.  Neurological:     Mental  Status: He is alert and oriented to person, place, and time.     Cranial Nerves: No cranial nerve deficit.     Motor: No abnormal muscle tone.     Coordination: Coordination normal.  Psychiatric:        Mood and Affect: Affect normal.     RN Janeann Merl served as chaperone during GU examination LABORATORY DATA:  I have reviewed the data as listed    Latest Ref Rng & Units 02/13/2023   12:00 PM 02/09/2023    6:55 PM 04/20/2017    2:09 PM  CBC  WBC 4.0 - 10.5 K/uL 6.8  11.9  10.6   Hemoglobin 13.0 - 17.0 g/dL 15.0  15.4  15.8   Hematocrit 39.0 - 52.0 % 45.7  46.9  45.7   Platelets 150 - 400 K/uL 304  280  240       Latest Ref Rng & Units 02/13/2023   12:00 PM 02/09/2023    6:55 PM 04/20/2017    2:09 PM  CMP  Glucose 70 - 99 mg/dL 96  106  112   BUN 6 - 20 mg/dL 11  10  11   $ Creatinine 0.61 - 1.24 mg/dL 1.11  1.14  1.26   Sodium 135 - 145 mmol/L 139  136  140   Potassium 3.5 - 5.1 mmol/L 4.0  3.8  4.0   Chloride 98 - 111 mmol/L 104  100  104   CO2 22 - 32 mmol/L 25  25  26   $ Calcium 8.9 - 10.3 mg/dL 9.4  9.4  10.1   Total Protein 6.5 - 8.1 g/dL 8.4  7.8    Total Bilirubin 0.3 - 1.2 mg/dL 0.7  1.1    Alkaline Phos 38 - 126 U/L 110  95    AST 15 - 41 U/L 34  34    ALT 0 - 44 U/L 39  31       RADIOGRAPHIC STUDIES: I have personally reviewed the radiological images as listed and agreed with the findings in the report. CT ABDOMEN PELVIS W CONTRAST  Result Date: 02/09/2023 CLINICAL DATA:  Left sided inguinal pain, hernia suspected EXAM: CT ABDOMEN AND PELVIS WITH CONTRAST TECHNIQUE: Multidetector CT imaging of the abdomen and pelvis was performed using the standard protocol following bolus administration of intravenous contrast. RADIATION DOSE REDUCTION: This exam was performed according to the departmental dose-optimization program which includes automated exposure control, adjustment of the mA and/or kV according to patient size and/or use of iterative reconstruction  technique. CONTRAST:  158m OMNIPAQUE IOHEXOL 300 MG/ML  SOLN COMPARISON:  None Available. FINDINGS: Lower chest: 5 mm mean diameter subpleural left lower lobe pulmonary nodule reference image 27/4. No acute pleural or parenchymal lung disease. Hepatobiliary: No focal liver abnormality is seen. No gallstones, gallbladder wall thickening, or biliary dilatation. Pancreas: Unremarkable. No pancreatic ductal dilatation or surrounding inflammatory changes. Spleen: Normal in size without focal abnormality. Adrenals/Urinary Tract: Adrenal glands are unremarkable. Kidneys are normal, without renal calculi, focal lesion, or hydronephrosis. Bladder is unremarkable. Stomach/Bowel: No bowel obstruction or ileus. Normal appendix right lower quadrant. No bowel wall thickening or inflammatory change. Vascular/Lymphatic: There are markedly enlarged left inguinal lymph nodes, largest measuring 6.1 x 5.5 x  4.4 cm. There is an adjacent necrotic lymph node measuring up to 3.2 cm in size. Borderline enlarged left external iliac chain lymph node measures 1.1 cm in short axis. No other pathologic adenopathy within the remainder of the abdomen or pelvis. Atherosclerosis of the distal abdominal aorta and bilateral iliac arteries without focal stenosis. Reproductive: Prostate is unremarkable. Other: No free fluid or free intraperitoneal gas. There is a small fat containing left inguinal hernia. No bowel herniation. Musculoskeletal: No acute or destructive bony lesions. Reconstructed images demonstrate no additional findings. IMPRESSION: 1. Massive lymphadenopathy within the left inguinal region, likely accounting for the patient's clinical symptoms. These lymph nodes are amenable to percutaneous sampling. 2. Small fat containing left inguinal hernia. Given the small size this is unlikely to account for the patient's clinical symptoms, especially in light of the massive left inguinal adenopathy described above. 3. 5 mm left solid pulmonary  nodule. Per Fleischner Society Guidelines, no routine follow-up imaging is recommended. These guidelines do not apply to immunocompromised patients and patients with cancer. Follow up in patients with significant comorbidities as clinically warranted. For lung cancer screening, adhere to Lung-RADS guidelines. Reference: Radiology. 2017; 284(1):228-43. 4.  Aortic Atherosclerosis (ICD10-I70.0). Electronically Signed   By: Randa Ngo M.D.   On: 02/09/2023 19:47

## 2023-02-13 NOTE — Telephone Encounter (Signed)
Request for US guided biopsy of left inguinal mass biopsy- faxed to IR.

## 2023-02-14 LAB — KAPPA/LAMBDA LIGHT CHAINS
Kappa free light chain: 16 mg/L (ref 3.3–19.4)
Kappa, lambda light chain ratio: 1.36 (ref 0.26–1.65)
Lambda free light chains: 11.8 mg/L (ref 5.7–26.3)

## 2023-02-14 LAB — HEPATITIS PANEL, ACUTE
HCV Ab: NONREACTIVE
Hep A IgM: NONREACTIVE
Hep B C IgM: NONREACTIVE
Hepatitis B Surface Ag: NONREACTIVE

## 2023-02-14 LAB — HSV DNA BY PCR (REFERENCE LAB)
HSV 1 DNA: NEGATIVE
HSV 2 DNA: NEGATIVE

## 2023-02-15 LAB — COMP PANEL: LEUKEMIA/LYMPHOMA

## 2023-02-15 LAB — CMV DNA, QUANTITATIVE, PCR
CMV DNA Quant: NEGATIVE IU/mL
Log10 CMV Qn DNA Pl: UNDETERMINED log10 IU/mL

## 2023-02-15 NOTE — Telephone Encounter (Signed)
IR offered a couple dates for pt to biopsy. Unable to reach pt or wife by ph. Detailed message left to contact IR to set up appt.

## 2023-02-16 LAB — EPSTEIN BARR VRS(EBV DNA BY PCR): EBV DNA QN by PCR: NEGATIVE IU/mL

## 2023-02-16 NOTE — Telephone Encounter (Signed)
Biopsy scheduled on 2/28. IR spoke to pt to set up appt

## 2023-02-19 LAB — MULTIPLE MYELOMA PANEL, SERUM
Albumin SerPl Elph-Mcnc: 3.7 g/dL (ref 2.9–4.4)
Albumin/Glob SerPl: 1.1 (ref 0.7–1.7)
Alpha 1: 0.3 g/dL (ref 0.0–0.4)
Alpha2 Glob SerPl Elph-Mcnc: 0.9 g/dL (ref 0.4–1.0)
B-Globulin SerPl Elph-Mcnc: 1.1 g/dL (ref 0.7–1.3)
Gamma Glob SerPl Elph-Mcnc: 1 g/dL (ref 0.4–1.8)
Globulin, Total: 3.4 g/dL (ref 2.2–3.9)
IgA: 69 mg/dL — ABNORMAL LOW (ref 90–386)
IgG (Immunoglobin G), Serum: 1077 mg/dL (ref 603–1613)
IgM (Immunoglobulin M), Srm: 104 mg/dL (ref 20–172)
Total Protein ELP: 7.1 g/dL (ref 6.0–8.5)

## 2023-02-21 ENCOUNTER — Ambulatory Visit
Admission: RE | Admit: 2023-02-21 | Discharge: 2023-02-21 | Disposition: A | Discharge status: Home / Self Care | Payer: Self-pay | Source: Ambulatory Visit | Attending: Oncology | Admitting: Oncology

## 2023-02-21 DIAGNOSIS — R590 Localized enlarged lymph nodes: Secondary | ICD-10-CM | POA: Insufficient documentation

## 2023-02-21 DIAGNOSIS — C8470 Anaplastic large cell lymphoma, ALK-negative, unspecified site: Secondary | ICD-10-CM | POA: Insufficient documentation

## 2023-02-21 MED ORDER — LIDOCAINE HCL (PF) 1 % IJ SOLN
10.0000 mL | Freq: Once | INTRAMUSCULAR | Status: AC
  Administered 2023-02-21: 10 mL via SUBCUTANEOUS
  Filled 2023-02-21: qty 10

## 2023-02-21 NOTE — Discharge Instructions (Signed)
Instructions discussed with patient. PCS

## 2023-02-21 NOTE — Procedures (Signed)
Vascular and Interventional Radiology Procedure Note  Patient: Randy [name]. DOB: Jan 29, 1981 Medical Record Number: [id number] Note Date/Time: 02/21/23 2:39 PM   Performing Physician: Michaelle Birks, MD Assistant(s): None  Diagnosis: LEFT inguinal mass   Procedure: LEFT INGUINAL MASS BIOPSY  Anesthesia: Local Anesthetic Complications: None Estimated Blood Loss: Minimal Specimens: Sent for Pathology  Findings:  Successful Ultrasound-guided biopsy of LEFT inguinal mass . A total of 3 samples were obtained. Hemostasis of the tract was achieved using Manual Pressure.  Plan: Bed rest for 0 hours.  See detailed procedure note with images in PACS. The patient tolerated the procedure well without incident or complication and was returned to Recovery in stable condition.    Michaelle Birks, MD [facility]   Pager. [place]

## 2023-02-27 ENCOUNTER — Encounter: Payer: Self-pay | Admitting: Pathology

## 2023-03-01 ENCOUNTER — Telehealth: Payer: Self-pay

## 2023-03-01 DIAGNOSIS — R590 Localized enlarged lymph nodes: Secondary | ICD-10-CM

## 2023-03-01 LAB — SURGICAL PATHOLOGY

## 2023-03-01 NOTE — Telephone Encounter (Signed)
PET scan order entered

## 2023-03-01 NOTE — Telephone Encounter (Signed)
-----   Message from Earlie Server, MD sent at 03/01/2023 12:09 PM EST ----- I am not able to reach him today.  His biopsy result comes back as lymphoma, need additional excisional biopsy to confirm the subtype of lymphoma. I recommend PET staging ASAP/STAT, and also refer to Dr. Peyton Najjar for evaluation of excisional biopsy.

## 2023-03-01 NOTE — Telephone Encounter (Signed)
Unable to reach pt by phone. Left detailed VM on pt and wife's cell phone with CB# for any questions. Left msessage stating that he will get a call from Dr. Deniece Ree office and a call from scheduling with PET scan appt.  Please schedule: PET scan asap  Will fax referral to Dr. Deniece Ree office.

## 2023-03-02 ENCOUNTER — Telehealth: Payer: Self-pay | Admitting: *Deleted

## 2023-03-02 NOTE — Telephone Encounter (Signed)
Pt . Left a message that someone from the cancer center just called about his results. Would like a call back.

## 2023-03-02 NOTE — Telephone Encounter (Signed)
Called pt back.    Thanks!

## 2023-03-02 NOTE — Telephone Encounter (Signed)
Spoke to pt this morning and relayed information. Pt verbalized understanding. Also got confirmation from [facility] @ Dr. Deniece Ree office that they have received referral.

## 2023-03-09 ENCOUNTER — Ambulatory Visit: Payer: Self-pay | Admitting: General Surgery

## 2023-03-09 ENCOUNTER — Telehealth: Payer: Self-pay | Admitting: Oncology

## 2023-03-09 ENCOUNTER — Other Ambulatory Visit: Payer: Self-pay

## 2023-03-09 ENCOUNTER — Telehealth: Payer: Self-pay

## 2023-03-09 DIAGNOSIS — I427 Cardiomyopathy due to drug and external agent: Secondary | ICD-10-CM

## 2023-03-09 DIAGNOSIS — R590 Localized enlarged lymph nodes: Secondary | ICD-10-CM

## 2023-03-09 NOTE — Telephone Encounter (Signed)
mail appt reminder sent to notify pt of appts

## 2023-03-09 NOTE — H&P (View-Only) (Signed)
PATIENT PROFILE: Randy [name]. is a 42 y.o. male who presents to the Clinic for consultation at the request of Dr. Tasia [name] for evaluation of excisional biopsy of left groin lymph node.   PCP:  Randy Server, MD   HISTORY OF PRESENT ILLNESS: Randy [name] reports he started feeling a mass in the left groin for the last 2 months.  He thought that it was a hernia.  He went to the ED due to the pain that it was causing.  Pain localized to the left groin.  No pain radiation.  Pain aggravated by applying pressure.  He had a CT scan of the abdomen and pelvis that shows a large left groin mass.  Due to concern of malignancy he was referred to hematology oncology.  He did not have ultrasound-guided core biopsy.  Results were positive for lymphoma but unable to differentiate between large cell lymphoma versus B-cell differentiation versus classic Hodgkin lymphoma.  Excisional biopsy was recommended for further establishment of more definitive diagnosis and able to plan treatment     PROBLEM LIST: Large cell lymphoma   GENERAL REVIEW OF SYSTEMS:    General ROS: negative for - chills, fatigue, fever, weight gain or weight loss Allergy and Immunology ROS: negative for - hives  Hematological and Lymphatic ROS: negative for - bleeding problems or bruising, negative for palpable nodes Endocrine ROS: negative for - heat or cold intolerance, hair changes Respiratory ROS: negative for - cough, shortness of breath or wheezing Cardiovascular ROS: no chest pain or palpitations GI ROS: negative for nausea, vomiting, abdominal pain, diarrhea, constipation Musculoskeletal ROS: negative for - joint swelling or muscle pain Neurological ROS: negative for - confusion, syncope Dermatological ROS: negative for pruritus and rash Psychiatric: negative for anxiety, depression, difficulty sleeping and memory loss   MEDICATIONS: Current Medications  No current outpatient medications on file.    No current facility-administered  medications for this visit.        ALLERGIES: Patient has no known allergies.   PAST MEDICAL HISTORY:     Past Medical History:  Diagnosis Date   Lymphoma (CMS-[name])     Seizures (CMS-[name])      as a child --- resolved now      PAST SURGICAL HISTORY: History reviewed. No pertinent surgical history.    FAMILY HISTORY:      Family History  Problem Relation Age of Onset   Diabetes Father     High blood pressure (Hypertension) Father        SOCIAL HISTORY: Social History           Socioeconomic History   Marital status: Married  Tobacco Use   Smoking status: Former      Types: Cigarettes   Smokeless tobacco: Never  Vaping Use   Vaping Use: Every day  Substance and Sexual Activity   Alcohol use: Never   Drug use: Yes      Comment: marijuana        PHYSICAL EXAM:    Vitals:    03/09/23 0814  BP: (!) 149/99  Pulse: 85    Body mass index is 19.79 kg/m. Weight: 68 kg (150 lb)    GENERAL: Alert, active, oriented x3   HEENT: Pupils equal reactive to light. Extraocular movements are intact. Sclera clear. Palpebral conjunctiva normal red color.Pharynx clear.   NECK: Supple with no palpable mass and no adenopathy.   LUNGS: Sound clear with no rales rhonchi or wheezes.   HEART: Regular rhythm S1 and S2 without  murmur.   ABDOMEN: Soft and depressible, nontender with no palpable mass, no hepatomegaly.  Large palpable mass in the left groin   EXTREMITIES: Well-developed well-nourished symmetrical with no dependent edema.   NEUROLOGICAL: Awake alert oriented, facial expression symmetrical, moving all extremities.   REVIEW OF DATA: I have reviewed the following data today: No results found for any previous visit.      ASSESSMENT: Randy [name] is a 42 y.o. male presenting for consultation for excisional biopsy of large left groin lymph node.   Patient with lymph nodes consistent with lymphoma.  Core biopsy was unable to characterize definite diagnosis between  the different type of lymphoma.  Excisional biopsy is recommended to establish today.  Diagnosis to be able to proceed with the adequate treatment.   Patient was oriented about the surgical procedure including excisional biopsy of the lymph node.  He was oriented about the risks including bleeding, infection, seroma, hematoma, DVTs, injury to adjacent organs, among others.  The patient reported he understood and agreed to proceed with excisional biopsy of the left groin.   Large cell lymphoma (CMS-[name]) [C85.80]   PLAN: 1.  Excisional biopsy of left inguinal lymph node (38531) 2.  CBC CMP done 3.  Avoid taking aspirin or any blood thinner 5-day before surgery 4.  Contact us if you have any concern   Patient verbalized understanding, all questions were answered, and were agreeable with the plan outlined above.        Randy Pun, MD   Electronically signed by Randy Pun, MD

## 2023-03-09 NOTE — H&P (Signed)
PATIENT PROFILE: Randy [name]. is a 42 y.o. male who presents to the Clinic for consultation at the request of Dr. Tasia Catchings for evaluation of excisional biopsy of left groin lymph node.   PCP:  Earlie Server, MD   HISTORY OF PRESENT ILLNESS: Randy [name] reports he started feeling a mass in the left groin for the last 2 months.  He thought that it was a hernia.  He went to the ED due to the pain that it was causing.  Pain localized to the left groin.  No pain radiation.  Pain aggravated by applying pressure.  He had a CT scan of the abdomen and pelvis that shows a large left groin mass.  Due to concern of malignancy he was referred to hematology oncology.  He did not have ultrasound-guided core biopsy.  Results were positive for lymphoma but unable to differentiate between large cell lymphoma versus B-cell differentiation versus classic Hodgkin lymphoma.  Excisional biopsy was recommended for further establishment of more definitive diagnosis and able to plan treatment     PROBLEM LIST: Large cell lymphoma   GENERAL REVIEW OF SYSTEMS:    General ROS: negative for - chills, fatigue, fever, weight gain or weight loss Allergy and Immunology ROS: negative for - hives  Hematological and Lymphatic ROS: negative for - bleeding problems or bruising, negative for palpable nodes Endocrine ROS: negative for - heat or cold intolerance, hair changes Respiratory ROS: negative for - cough, shortness of breath or wheezing Cardiovascular ROS: no chest pain or palpitations GI ROS: negative for nausea, vomiting, abdominal pain, diarrhea, constipation Musculoskeletal ROS: negative for - joint swelling or muscle pain Neurological ROS: negative for - confusion, syncope Dermatological ROS: negative for pruritus and rash Psychiatric: negative for anxiety, depression, difficulty sleeping and memory loss   MEDICATIONS: Current Medications  No current outpatient medications on file.    No current facility-administered  medications for this visit.        ALLERGIES: Patient has no known allergies.   PAST MEDICAL HISTORY:     Past Medical History:  Diagnosis Date   Lymphoma (CMS-[name])     Seizures (CMS-[name])      as a child --- resolved now      PAST SURGICAL HISTORY: History reviewed. No pertinent surgical history.    FAMILY HISTORY:      Family History  Problem Relation Age of Onset   Diabetes Father     High blood pressure (Hypertension) Father        SOCIAL HISTORY: Social History           Socioeconomic History   Marital status: Married  Tobacco Use   Smoking status: Former      Types: Cigarettes   Smokeless tobacco: Never  Vaping Use   Vaping Use: Every day  Substance and Sexual Activity   Alcohol use: Never   Drug use: Yes      Comment: marijuana        PHYSICAL EXAM:    Vitals:    03/09/23 0814  BP: (!) 149/99  Pulse: 85    Body mass index is 19.79 kg/m. Weight: 68 kg (150 lb)    GENERAL: Alert, active, oriented x3   HEENT: Pupils equal reactive to light. Extraocular movements are intact. Sclera clear. Palpebral conjunctiva normal red color.Pharynx clear.   NECK: Supple with no palpable mass and no adenopathy.   LUNGS: Sound clear with no rales rhonchi or wheezes.   HEART: Regular rhythm S1 and S2 without  murmur.   ABDOMEN: Soft and depressible, nontender with no palpable mass, no hepatomegaly.  Large palpable mass in the left groin   EXTREMITIES: Well-developed well-nourished symmetrical with no dependent edema.   NEUROLOGICAL: Awake alert oriented, facial expression symmetrical, moving all extremities.   REVIEW OF DATA: I have reviewed the following data today: No results found for any previous visit.      ASSESSMENT: Randy [name] is a 42 y.o. male presenting for consultation for excisional biopsy of large left groin lymph node.   Patient with lymph nodes consistent with lymphoma.  Core biopsy was unable to characterize definite diagnosis between  the different type of lymphoma.  Excisional biopsy is recommended to establish today.  Diagnosis to be able to proceed with the adequate treatment.   Patient was oriented about the surgical procedure including excisional biopsy of the lymph node.  He was oriented about the risks including bleeding, infection, seroma, hematoma, DVTs, injury to adjacent organs, among others.  The patient reported he understood and agreed to proceed with excisional biopsy of the left groin.   Large cell lymphoma (CMS-[name]) [C85.80]   PLAN: 1.  Excisional biopsy of left inguinal lymph node (38531) 2.  CBC CMP done 3.  Avoid taking aspirin or any blood thinner 5-day before surgery 4.  Contact us if you have any concern   Patient verbalized understanding, all questions were answered, and were agreeable with the plan outlined above.        Herbert Pun, MD   Electronically signed by Herbert Pun, MD

## 2023-03-09 NOTE — Telephone Encounter (Signed)
Dr. Tasia Catchings has discussed with patient the need for bone marrow biopsy and 2D echo.   BM bx has been reqeuested and pt will be contacted once it has been schedule.   please schedule pt for echo and inform pt of appt.

## 2023-03-11 MED ORDER — CEFAZOLIN SODIUM-DEXTROSE 2-4 GM/100ML-% IV SOLN
2.0000 g | INTRAVENOUS | Status: AC
  Administered 2023-03-12: 2 g via INTRAVENOUS

## 2023-03-12 ENCOUNTER — Other Ambulatory Visit: Payer: Self-pay

## 2023-03-12 ENCOUNTER — Ambulatory Visit
Admission: RE | Admit: 2023-03-12 | Discharge: 2023-03-12 | Disposition: A | Discharge status: Home / Self Care | Payer: Self-pay | Attending: General Surgery | Admitting: General Surgery

## 2023-03-12 ENCOUNTER — Ambulatory Visit: Payer: Self-pay | Admitting: Anesthesiology

## 2023-03-12 ENCOUNTER — Encounter
Admission: RE | Disposition: A | Discharge status: Home / Self Care | Payer: Self-pay | Source: Home / Self Care | Attending: General Surgery

## 2023-03-12 ENCOUNTER — Encounter: Payer: Self-pay | Admitting: General Surgery

## 2023-03-12 DIAGNOSIS — R591 Generalized enlarged lymph nodes: Secondary | ICD-10-CM | POA: Insufficient documentation

## 2023-03-12 DIAGNOSIS — Z01818 Encounter for other preprocedural examination: Secondary | ICD-10-CM

## 2023-03-12 DIAGNOSIS — C8590 Non-Hodgkin lymphoma, unspecified, unspecified site: Secondary | ICD-10-CM | POA: Insufficient documentation

## 2023-03-12 DIAGNOSIS — Z87891 Personal history of nicotine dependence: Secondary | ICD-10-CM | POA: Insufficient documentation

## 2023-03-12 DIAGNOSIS — Z8669 Personal history of other diseases of the nervous system and sense organs: Secondary | ICD-10-CM | POA: Insufficient documentation

## 2023-03-12 HISTORY — PX: INGUINAL LYMPH NODE BIOPSY: SHX5865

## 2023-03-12 SURGERY — BIOPSY, LYMPH NODE, INGUINAL, OPEN
Anesthesia: General | Site: Inguinal | Laterality: Left

## 2023-03-12 MED ORDER — BUPIVACAINE HCL (PF) 0.25 % IJ SOLN
INTRAMUSCULAR | Status: AC
  Filled 2023-03-12: qty 30

## 2023-03-12 MED ORDER — DEXMEDETOMIDINE HCL IN NACL 80 MCG/20ML IV SOLN
INTRAVENOUS | Status: DC | PRN
  Administered 2023-03-12 (×2): 8 ug via BUCCAL

## 2023-03-12 MED ORDER — DEXAMETHASONE SODIUM PHOSPHATE 10 MG/ML IJ SOLN
INTRAMUSCULAR | Status: AC
  Filled 2023-03-12: qty 1

## 2023-03-12 MED ORDER — OXYCODONE HCL 5 MG/5ML PO SOLN: 5.0000 mg | Freq: Once | ORAL | Status: AC | PRN

## 2023-03-12 MED ORDER — FENTANYL CITRATE (PF) 100 MCG/2ML IJ SOLN: 25.0000 ug | INTRAMUSCULAR | Status: DC | PRN

## 2023-03-12 MED ORDER — 0.9 % SODIUM CHLORIDE (POUR BTL) OPTIME
TOPICAL | Status: DC | PRN
  Administered 2023-03-12: 10 mL

## 2023-03-12 MED ORDER — OXYCODONE HCL 5 MG PO TABS: 5.0000 mg | ORAL_TABLET | Freq: Once | ORAL | Status: AC | PRN

## 2023-03-12 MED ORDER — MIDAZOLAM HCL 2 MG/2ML IJ SOLN
INTRAMUSCULAR | Status: DC | PRN
  Administered 2023-03-12: 2 mg via INTRAVENOUS

## 2023-03-12 MED ORDER — BUPIVACAINE-EPINEPHRINE 0.25% -1:200000 IJ SOLN
INTRAMUSCULAR | Status: DC | PRN
  Administered 2023-03-12: 30 mL

## 2023-03-12 MED ORDER — CHLORHEXIDINE GLUCONATE 0.12 % MT SOLN
OROMUCOSAL | Status: AC
  Administered 2023-03-12: 15 mL via OROMUCOSAL
  Filled 2023-03-12: qty 15

## 2023-03-12 MED ORDER — MIDAZOLAM HCL 2 MG/2ML IJ SOLN
INTRAMUSCULAR | Status: AC
  Filled 2023-03-12: qty 2

## 2023-03-12 MED ORDER — HEMOSTATIC AGENTS (NO CHARGE) OPTIME
TOPICAL | Status: DC | PRN
  Administered 2023-03-12: 1 via TOPICAL

## 2023-03-12 MED ORDER — EPINEPHRINE PF 1 MG/ML IJ SOLN
INTRAMUSCULAR | Status: AC
  Filled 2023-03-12: qty 1

## 2023-03-12 MED ORDER — OXYCODONE HCL 5 MG PO TABS
ORAL_TABLET | ORAL | Status: AC
  Administered 2023-03-12: 5 mg via ORAL
  Filled 2023-03-12: qty 1

## 2023-03-12 MED ORDER — ONDANSETRON HCL 4 MG/2ML IJ SOLN
INTRAMUSCULAR | Status: AC
  Filled 2023-03-12: qty 2

## 2023-03-12 MED ORDER — HYDROCODONE-ACETAMINOPHEN 5-325 MG PO TABS: 1.0000 | ORAL_TABLET | ORAL | 0 refills | Status: AC | PRN

## 2023-03-12 MED ORDER — PROPOFOL 10 MG/ML IV BOLUS
INTRAVENOUS | Status: DC | PRN
  Administered 2023-03-12: 200 mg via INTRAVENOUS

## 2023-03-12 MED ORDER — LIDOCAINE HCL (CARDIAC) PF 100 MG/5ML IV SOSY
PREFILLED_SYRINGE | INTRAVENOUS | Status: DC | PRN
  Administered 2023-03-12: 100 mg via INTRAVENOUS

## 2023-03-12 MED ORDER — LACTATED RINGERS IV SOLN: INTRAVENOUS | Status: DC | PRN

## 2023-03-12 MED ORDER — ORAL CARE MOUTH RINSE: 15.0000 mL | Freq: Once | OROMUCOSAL | Status: AC

## 2023-03-12 MED ORDER — DEXAMETHASONE SODIUM PHOSPHATE 10 MG/ML IJ SOLN
INTRAMUSCULAR | Status: DC | PRN
  Administered 2023-03-12: 10 mg via INTRAVENOUS

## 2023-03-12 MED ORDER — CHLORHEXIDINE GLUCONATE 0.12 % MT SOLN: 15.0000 mL | Freq: Once | OROMUCOSAL | Status: AC

## 2023-03-12 MED ORDER — CEFAZOLIN SODIUM-DEXTROSE 2-4 GM/100ML-% IV SOLN
INTRAVENOUS | Status: AC
  Filled 2023-03-12: qty 100

## 2023-03-12 MED ORDER — FENTANYL CITRATE (PF) 100 MCG/2ML IJ SOLN
INTRAMUSCULAR | Status: AC
  Filled 2023-03-12: qty 2

## 2023-03-12 MED ORDER — FENTANYL CITRATE (PF) 100 MCG/2ML IJ SOLN
INTRAMUSCULAR | Status: DC | PRN
  Administered 2023-03-12 (×2): 50 ug via INTRAVENOUS

## 2023-03-12 MED ORDER — LACTATED RINGERS IV SOLN: INTRAVENOUS | Status: DC

## 2023-03-12 MED ORDER — ONDANSETRON HCL 4 MG/2ML IJ SOLN
INTRAMUSCULAR | Status: DC | PRN
  Administered 2023-03-12: 4 mg via INTRAVENOUS

## 2023-03-12 SURGICAL SUPPLY — 35 items
ADH SKN CLS APL DERMABOND .7 (GAUZE/BANDAGES/DRESSINGS) ×1
APL PRP STRL LF DISP 70% ISPRP (MISCELLANEOUS) ×1
BLADE CLIPPER SURG (BLADE) IMPLANT
BLADE SURG 15 STRL LF DISP TIS (BLADE) ×1 IMPLANT
BLADE SURG 15 STRL SS (BLADE) ×1
CHLORAPREP W/TINT 26 (MISCELLANEOUS) ×1 IMPLANT
CNTNR URN SCR LID CUP LEK RST (MISCELLANEOUS) IMPLANT
CONT SPEC 4OZ STRL OR WHT (MISCELLANEOUS)
DERMABOND ADVANCED .7 DNX12 (GAUZE/BANDAGES/DRESSINGS) ×1 IMPLANT
DRAPE LAPAROTOMY TRNSV 106X77 (MISCELLANEOUS) ×1 IMPLANT
ELECT CAUTERY BLADE 6.4 (BLADE) ×1 IMPLANT
ELECT REM PT RETURN 9FT ADLT (ELECTROSURGICAL) ×1
ELECTRODE REM PT RTRN 9FT ADLT (ELECTROSURGICAL) ×1 IMPLANT
GAUZE 4X4 16PLY ~~LOC~~+RFID DBL (SPONGE) ×1 IMPLANT
GLOVE BIO SURGEON STRL SZ 6.5 (GLOVE) ×1 IMPLANT
GLOVE BIOGEL PI IND STRL 6.5 (GLOVE) ×1 IMPLANT
GOWN STRL REUS W/ TWL LRG LVL3 (GOWN DISPOSABLE) ×2 IMPLANT
GOWN STRL REUS W/TWL LRG LVL3 (GOWN DISPOSABLE) ×2
HEMOSTAT ARISTA ABSORB 3G PWDR (HEMOSTASIS) IMPLANT
KIT TURNOVER KIT A (KITS) ×1 IMPLANT
LABEL OR SOLS (LABEL) ×1 IMPLANT
MANIFOLD NEPTUNE II (INSTRUMENTS) ×1 IMPLANT
NEEDLE HYPO 22GX1.5 SAFETY (NEEDLE) ×1 IMPLANT
NS IRRIG 500ML POUR BTL (IV SOLUTION) ×1 IMPLANT
PACK BASIN MINOR ARMC (MISCELLANEOUS) ×1 IMPLANT
SUT MNCRL 4-0 (SUTURE) ×1
SUT MNCRL 4-0 27XMFL (SUTURE) ×1
SUT VIC AB 2-0 SH 27 (SUTURE) ×1
SUT VIC AB 2-0 SH 27XBRD (SUTURE) IMPLANT
SUT VIC AB 3-0 SH 27 (SUTURE) ×1
SUT VIC AB 3-0 SH 27X BRD (SUTURE) ×1 IMPLANT
SUTURE MNCRL 4-0 27XMF (SUTURE) ×1 IMPLANT
SYR 10ML LL (SYRINGE) ×1 IMPLANT
TRAP FLUID SMOKE EVACUATOR (MISCELLANEOUS) ×1 IMPLANT
WATER STERILE IRR 500ML POUR (IV SOLUTION) ×1 IMPLANT

## 2023-03-12 NOTE — Discharge Instructions (Addendum)
  Diet: Resume home heart healthy regular diet.   Activity: No heavy lifting >20 pounds (children, pets, laundry, garbage) or strenuous activity until follow-up, but light activity and walking are encouraged. Do not drive or drink alcohol if taking narcotic pain medications.  Wound care: May shower with soapy water and pat dry (do not rub incisions), but no baths or submerging incision underwater until follow-up. (no swimming)   Medications: Resume all home medications. For mild to moderate pain: acetaminophen (Tylenol) ***or ibuprofen (if no kidney disease). Combining Tylenol with alcohol can substantially increase your risk of causing liver disease. Narcotic pain medications, if prescribed, can be used for severe pain, though may cause nausea, constipation, and drowsiness. Do not combine Tylenol and Norco within a 6 hour period as Norco contains Tylenol. If you do not need the narcotic pain medication, you do not need to fill the prescription.  Call office ([phone]) at any time if any questions, worsening pain, fevers/chills, bleeding, drainage from incision site, or other concerns.   AMBULATORY SURGERY  DISCHARGE INSTRUCTIONS   The drugs that you were given will stay in your system until tomorrow so for the next 24 hours you should not:  Drive an automobile Make any legal decisions Drink any alcoholic beverage   You may resume regular meals tomorrow.  Today it is better to start with liquids and gradually work up to solid foods.  You may eat anything you prefer, but it is better to start with liquids, then soup and crackers, and gradually work up to solid foods.   Please notify your doctor immediately if you have any unusual bleeding, trouble breathing, redness and pain at the surgery site, drainage, fever, or pain not relieved by medication.    Additional Instructions:        Please contact your physician with any problems or Same Day Surgery at [phone], Monday  through Friday 6 am to 4 pm, or [place] [facility] [place] Main number at [phone].  

## 2023-03-12 NOTE — Anesthesia Preprocedure Evaluation (Addendum)
Anesthesia Evaluation  Patient identified by MRN, date of birth, ID band Patient awake    Reviewed: Allergy & Precautions, NPO status , Patient's Chart, lab work & pertinent test results  History of Anesthesia Complications Negative for: history of anesthetic complications  Airway Mallampati: III  TM Distance: >3 FB Neck ROM: full    Dental  (+) Chipped, Poor Dentition, Missing   Pulmonary neg shortness of breath, former smoker   Pulmonary exam normal        Cardiovascular Exercise Tolerance: Good (-) angina (-) Past MI Normal cardiovascular exam     Neuro/Psych  Headaches, Seizures -, Well Controlled,   negative psych ROS   GI/Hepatic negative GI ROS, Neg liver ROS,neg GERD  ,,  Endo/Other  negative endocrine ROS    Renal/GU      Musculoskeletal   Abdominal   Peds  Hematology negative hematology ROS (+)   Anesthesia Other Findings Past Medical History: No date: Migraines No date: Seizures ([place])  History reviewed. No pertinent surgical history.  BMI    Body Mass Index: 32.98 kg/m      Reproductive/Obstetrics negative OB ROS                             Anesthesia Physical Anesthesia Plan  ASA: 3  Anesthesia Plan: General LMA   Post-op Pain Management:    Induction: Intravenous  PONV Risk Score and Plan: Ondansetron, Dexamethasone, Midazolam and Treatment may vary due to age or medical condition  Airway Management Planned: LMA  Additional Equipment:   Intra-op Plan:   Post-operative Plan: Extubation in OR  Informed Consent: I have reviewed the patients History and Physical, chart, labs and discussed the procedure including the risks, benefits and alternatives for the proposed anesthesia with the patient or authorized representative who has indicated his/her understanding and acceptance.     Dental Advisory Given  Plan Discussed with: Anesthesiologist, CRNA and  Surgeon  Anesthesia Plan Comments: (Patient consented for risks of anesthesia including but not limited to:  - adverse reactions to medications - damage to eyes, teeth, lips or other oral mucosa - nerve damage due to positioning  - sore throat or hoarseness - Damage to heart, brain, nerves, lungs, other parts of body or loss of life  Patient voiced understanding.)       Anesthesia Quick Evaluation

## 2023-03-12 NOTE — Interval H&P Note (Signed)
History and Physical Interval Note:  03/12/2023 2:39 PM  Randy [name].  has presented today for surgery, with the diagnosis of large cell lymphoma C85.80.  The various methods of treatment have been discussed with the patient and family. After consideration of risks, benefits and other options for treatment, the patient has consented to  Procedure(s): INGUINAL LYMPH NODE BIOPSY excisional (Left) as a surgical intervention.  The patient's history has been reviewed, patient examined, no change in status, stable for surgery.  I have reviewed the patient's chart and labs.  Questions were answered to the patient's satisfaction.     Herbert Pun

## 2023-03-12 NOTE — Op Note (Signed)
OPERATION REPORT  Pre Operative Diagnosis: Lymphadenopathy, lymphoma  Post operative diagnosis: Same  Anesthesia: General and Local   Surgeon: Dr. Windell Moment  Procedure: Excision of left inguinal lesion note, the   Indication: This 42 y.o. year old male with a large left inguinal lymph node.  Core biopsy shows lymphoma but unable to completely characterize.  Excisional biopsy recommended for confirmation of diagnosis.   Description of procedure: after orienting patient about the procedure steps and benefits and patient agreed to proceed. Time out was done identifying correct patient and location of procedure. After induction of general anesthesia, local anesthesia was infiltrated around the palpable lymph node. With a blade #15, an elliptical incision was made using the skin lines. Sharp dissection was carried down deep in the inguinal region and the lymph node was excised completely as a whole.  The wound was closed in layers.  Skin closed with 4-0 Monocryl in subcuticular fashion. Specimen sent to pathology.    Complications: none   EBL: minimal  Herbert Pun, MD, FACS

## 2023-03-12 NOTE — Transfer of Care (Signed)
Immediate Anesthesia Transfer of Care Note  Patient: Randy [name].  Procedure(s) Performed: INGUINAL LYMPH NODE BIOPSY excisional (Left: Inguinal)  Patient Location: PACU  Anesthesia Type:General  Level of Consciousness: drowsy  Airway & Oxygen Therapy: Patient Spontanous Breathing and Patient connected to face mask oxygen  Post-op Assessment: Report given to RN and Post -op Vital signs reviewed and stable  Post vital signs: Reviewed and stable  Last Vitals:  Vitals Value Taken Time  BP 128/77 03/12/23 1645  Temp 36.3 C 03/12/23 1645  Pulse 75 03/12/23 1649  Resp 14 03/12/23 1649  SpO2 100 % 03/12/23 1649  Vitals shown include unvalidated device data.  Last Pain:  Vitals:   03/12/23 1131  TempSrc: Temporal  PainSc: 1          Complications: No notable events documented.

## 2023-03-12 NOTE — Anesthesia Postprocedure Evaluation (Signed)
Anesthesia Post Note  Patient: Randy [name].  Procedure(s) Performed: INGUINAL LYMPH NODE BIOPSY excisional (Left: Inguinal)  Patient location during evaluation: PACU Anesthesia Type: General Level of consciousness: awake and alert, oriented and patient cooperative Pain management: pain level controlled Vital Signs Assessment: post-procedure vital signs reviewed and stable Respiratory status: spontaneous breathing, nonlabored ventilation and respiratory function stable Cardiovascular status: blood pressure returned to baseline and stable Postop Assessment: adequate PO intake Anesthetic complications: no   No notable events documented.   Last Vitals:  Vitals:   03/12/23 1746 03/12/23 1752  BP: (!) 153/102 (!) 148/100  Pulse: 71   Resp:    Temp:    SpO2: 100%     Last Pain:  Vitals:   03/12/23 1739  TempSrc: Temporal  PainSc: [place]

## 2023-03-12 NOTE — Anesthesia Procedure Notes (Signed)
Procedure Name: LMA Insertion Date/Time: 03/12/2023 3:41 PM  Performed by: Cammie Sickle, CRNAPre-anesthesia Checklist: Patient identified, Patient being monitored, Timeout performed, Emergency Drugs available and Suction available Patient Re-evaluated:Patient Re-evaluated prior to induction Oxygen Delivery Method: Circle system utilized Preoxygenation: Pre-oxygenation with 100% oxygen Induction Type: IV induction Ventilation: Mask ventilation without difficulty LMA: LMA inserted LMA Size: 5.0 Tube type: Oral Number of attempts: 1 Placement Confirmation: positive ETCO2 and breath sounds checked- equal and bilateral Tube secured with: Tape Dental Injury: Teeth and Oropharynx as per pre-operative assessment  Comments: Smooth atraumatic LMA placement, no complications noted

## 2023-03-13 ENCOUNTER — Encounter: Payer: Self-pay | Admitting: General Surgery

## 2023-03-14 ENCOUNTER — Ambulatory Visit
Admission: RE | Admit: 2023-03-14 | Discharge: 2023-03-14 | Disposition: A | Discharge status: Home / Self Care | Payer: Self-pay | Source: Ambulatory Visit | Attending: Oncology | Admitting: Oncology

## 2023-03-14 ENCOUNTER — Encounter: Payer: Self-pay | Admitting: Oncology

## 2023-03-14 DIAGNOSIS — K4090 Unilateral inguinal hernia, without obstruction or gangrene, not specified as recurrent: Secondary | ICD-10-CM | POA: Insufficient documentation

## 2023-03-14 DIAGNOSIS — J351 Hypertrophy of tonsils: Secondary | ICD-10-CM | POA: Insufficient documentation

## 2023-03-14 DIAGNOSIS — I700 Atherosclerosis of aorta: Secondary | ICD-10-CM | POA: Insufficient documentation

## 2023-03-14 DIAGNOSIS — R590 Localized enlarged lymph nodes: Secondary | ICD-10-CM | POA: Insufficient documentation

## 2023-03-14 LAB — GLUCOSE, CAPILLARY: Glucose-Capillary: 101 mg/dL — ABNORMAL HIGH (ref 70–99)

## 2023-03-14 MED ORDER — FLUDEOXYGLUCOSE F - 18 (FDG) INJECTION
12.0000 | Freq: Once | INTRAVENOUS | Status: AC | PRN
  Administered 2023-03-14: 13.14 via INTRAVENOUS

## 2023-03-19 ENCOUNTER — Other Ambulatory Visit (HOSPITAL_COMMUNITY): Payer: Self-pay | Admitting: Student

## 2023-03-19 DIAGNOSIS — R590 Localized enlarged lymph nodes: Secondary | ICD-10-CM

## 2023-03-20 NOTE — Progress Notes (Signed)
Patient for IR Bone Marrow Biopsy on Wed 03/21/2023, I called and spoke with the patient on the phone and gave pre-procedure instructions. Pt was made aware to be here at 7:30a, NPO after MN prior to procedure as well as driver post procedure/recovery/discharge. Pt stated understanding.  Called 03/20/2023

## 2023-03-21 ENCOUNTER — Ambulatory Visit
Admission: RE | Admit: 2023-03-21 | Discharge: 2023-03-21 | Disposition: A | Discharge status: Home / Self Care | Payer: Self-pay | Source: Ambulatory Visit | Attending: Oncology | Admitting: Oncology

## 2023-03-21 DIAGNOSIS — Z87891 Personal history of nicotine dependence: Secondary | ICD-10-CM | POA: Insufficient documentation

## 2023-03-21 DIAGNOSIS — C8590 Non-Hodgkin lymphoma, unspecified, unspecified site: Secondary | ICD-10-CM | POA: Insufficient documentation

## 2023-03-21 DIAGNOSIS — R590 Localized enlarged lymph nodes: Secondary | ICD-10-CM

## 2023-03-21 DIAGNOSIS — R569 Unspecified convulsions: Secondary | ICD-10-CM | POA: Insufficient documentation

## 2023-03-21 HISTORY — PX: IR BONE MARROW BIOPSY & ASPIRATION: IMG5727

## 2023-03-21 LAB — CBC WITH DIFFERENTIAL/PLATELET
Abs Immature Granulocytes: 0.03 10*3/uL (ref 0.00–0.07)
Basophils Absolute: 0 10*3/uL (ref 0.0–0.1)
Basophils Relative: 0 %
Eosinophils Absolute: 0.3 10*3/uL (ref 0.0–0.5)
Eosinophils Relative: 4 %
HCT: 47.6 % (ref 39.0–52.0)
Hemoglobin: 15.9 g/dL (ref 13.0–17.0)
Immature Granulocytes: 0 %
Lymphocytes Relative: 20 %
Lymphs Abs: 1.9 10*3/uL (ref 0.7–4.0)
MCH: 28.3 pg (ref 26.0–34.0)
MCHC: 33.4 g/dL (ref 30.0–36.0)
MCV: 84.7 fL (ref 80.0–100.0)
Monocytes Absolute: 0.9 10*3/uL (ref 0.1–1.0)
Monocytes Relative: 9 %
Neutro Abs: 6.2 10*3/uL (ref 1.7–7.7)
Neutrophils Relative %: 67 %
Platelets: 278 10*3/uL (ref 150–400)
RBC: 5.62 MIL/uL (ref 4.22–5.81)
RDW: 14.8 % (ref 11.5–15.5)
WBC: 9.3 10*3/uL (ref 4.0–10.5)
nRBC: 0 % (ref 0.0–0.2)

## 2023-03-21 MED ORDER — HEPARIN SOD (PORK) LOCK FLUSH 100 UNIT/ML IV SOLN
INTRAVENOUS | Status: AC
  Filled 2023-03-21: qty 5

## 2023-03-21 MED ORDER — MIDAZOLAM HCL 2 MG/2ML IJ SOLN
INTRAMUSCULAR | Status: AC | PRN
  Administered 2023-03-21: 1 mg via INTRAVENOUS

## 2023-03-21 MED ORDER — MIDAZOLAM HCL 5 MG/5ML IJ SOLN
INTRAMUSCULAR | Status: AC | PRN
  Administered 2023-03-21: 1 mg via INTRAVENOUS

## 2023-03-21 MED ORDER — LIDOCAINE HCL (PF) 1 % IJ SOLN
10.0000 mL | Freq: Once | INTRAMUSCULAR | Status: AC
  Administered 2023-03-21: 10 mL via INTRADERMAL

## 2023-03-21 MED ORDER — MIDAZOLAM HCL 2 MG/2ML IJ SOLN
INTRAMUSCULAR | Status: AC
  Filled 2023-03-21: qty 2

## 2023-03-21 MED ORDER — FENTANYL CITRATE (PF) 100 MCG/2ML IJ SOLN
INTRAMUSCULAR | Status: AC | PRN
  Administered 2023-03-21 (×2): 50 ug via INTRAVENOUS

## 2023-03-21 MED ORDER — FENTANYL CITRATE (PF) 100 MCG/2ML IJ SOLN
INTRAMUSCULAR | Status: AC
  Filled 2023-03-21: qty 2

## 2023-03-21 MED ORDER — SODIUM CHLORIDE 0.9 % IV SOLN: INTRAVENOUS | Status: DC

## 2023-03-21 NOTE — Procedures (Signed)
Interventional Radiology Procedure Note  Date of Procedure: 03/21/2023  Procedure: IR BMBx  Findings:  1. BMBx, right posterior ilium, dry tap, x2 cores obtained    Complications: No immediate complications noted.   Estimated Blood Loss: minimal  Follow-up and Recommendations: 1. Bedrest 1 hour    Albin Felling, MD  Vascular & Interventional Radiology  03/21/2023 9:20 AM

## 2023-03-21 NOTE — Progress Notes (Signed)
Patient clinically stable post BMB per Dr Denna Haggard, tolerated well. Vitals stable post procedure. Received Versed 2 mg along with Fentanyl 100 mcg IV for procedure. Denies complaints post procedure. Called and gave discharge instructions to wife/Stephanie with questions answered.

## 2023-03-21 NOTE — H&P (Signed)
Chief Complaint: Patient was seen in consultation today for lymphoma  Referring Physician(s): Yu,Zhou  Supervising Physician: Juliet Rude  Patient Status: [facility] - Out-pt  History of Present Illness: Randy [name]. is a 42 y.o. male with PMH significant for migraines and seizures being seen today in relation to newly diagnosed lymphoma. Patient was seen by Dr Maryelizabeth Kaufmann on 02/21/23 for image-guided lymph node core biosy which revealed lymphoma, but was unable to determine the specific type of lymphoma. Patient was subsequently seen by Dr Peyton Najjar for excisional biopsy on 3/18. Patient has subsequently been referred to IR for image-guided bone marrow biopsy.  Past Medical History:  Diagnosis Date   Migraines    Seizures ([place])     Past Surgical History:  Procedure Laterality Date   INGUINAL LYMPH NODE BIOPSY Left 03/12/2023   Procedure: INGUINAL LYMPH NODE BIOPSY excisional;  Surgeon: Herbert Pun, MD;  Location: [facility] ORS;  Service: General;  Laterality: Left;    Allergies: Patient has no known allergies.  Medications: Prior to Admission medications   Medication Sig Start Date End Date Taking? Authorizing Provider  Aspirin-Salicylamide-Caffeine (BC HEADACHE POWDER PO) Take 1 packet by mouth daily as needed (pain). Patient not taking: Reported on 02/13/2023    [provider]  doxycycline (VIBRA-TABS) 100 MG tablet Take 1 tablet (100 mg total) by mouth 2 (two) times daily. Patient not taking: Reported on 03/12/2023 02/09/23   Harvest Dark, MD  fluconazole (DIFLUCAN) 100 MG tablet Take 1 tablet (100 mg total) by mouth once. Take 2 tabs po x 1, then start 1 tab po qd Patient not taking: Reported on 04/20/2017 07/21/14   Shawnee Knapp, MD  hydrOXYzine (ATARAX/VISTARIL) 25 MG tablet Take 0.5-1 tablets (12.5-25 mg total) by mouth every 8 (eight) hours as needed for itching. Patient not taking: Reported on 04/20/2017 07/21/14   Shawnee Knapp, MD  mupirocin ointment  (BACTROBAN) 2 % Apply 1 application topically 3 (three) times daily. Patient not taking: Reported on 04/20/2017 07/21/14   Shawnee Knapp, MD  traMADol (ULTRAM) 50 MG tablet Take 1 tablet (50 mg total) by mouth every 6 (six) hours as needed. Patient not taking: Reported on 03/12/2023 02/09/23   Harvest Dark, MD  triamcinolone cream (KENALOG) 0.1 % Apply 1 application topically 2 (two) times daily. Patient not taking: Reported on 04/20/2017 07/21/14   Shawnee Knapp, MD     Family History  Problem Relation Age of Onset   Stroke Father    Diabetes Father     Social History   Socioeconomic History   Marital status: Single    Spouse name: Not on file   Number of children: Not on file   Years of education: Not on file   Highest education level: Not on file  Occupational History   Not on file  Tobacco Use   Smoking status: Former    Years: 15    Types: Cigarettes    Quit date: 03/25/2022    Years since quitting: 0.9   Smokeless tobacco: Not on file  Vaping Use   Vaping Use: Every day   Start date: 03/27/2023  Substance and Sexual Activity   Alcohol use: Yes   Drug use: No   Sexual activity: Not on file  Other Topics Concern   Not on file  Social History Narrative   Not on file   Social Determinants of Health   Financial Resource Strain: Low Risk  (02/13/2023)   Overall Financial Resource Strain (CARDIA)  Difficulty of Paying Living Expenses: Not hard at all  Food Insecurity: No Food Insecurity (02/13/2023)   Hunger Vital Sign    Worried About Running Out of Food in the Last Year: Never true    Ran Out of Food in the Last Year: Never true  Transportation Needs: No Transportation Needs (02/13/2023)   PRAPARE - Hydrologist (Medical): No    Lack of Transportation (Non-Medical): No  Physical Activity: Not on file  Stress: No Stress Concern Present (02/13/2023)   [place]    Feeling of  Stress : Not at all  Social Connections: Not on file    Code Status: Full Code  Review of Systems: A 12 point ROS discussed and pertinent positives are indicated in the HPI above.  All other systems are negative.  Review of Systems  Constitutional:  Negative for chills and fever.  Respiratory:  Negative for chest tightness and shortness of breath.   Cardiovascular:  Negative for chest pain and leg swelling.  Gastrointestinal:  Negative for abdominal pain, diarrhea, nausea and vomiting.  Neurological:  Positive for headaches. Negative for dizziness.  Psychiatric/Behavioral:  Negative for confusion.     Vital Signs: BP (!) 157/100   Pulse 95   Temp 97.8 F (36.6 C)   Resp 15   Ht 6\' 1"  (1.854 m)   Wt 250 lb (113.4 kg)   SpO2 97%   BMI 32.98 kg/m     Physical Exam Vitals reviewed.  Constitutional:      General: He is not in acute distress.    Appearance: He is not ill-appearing.  Cardiovascular:     Rate and Rhythm: Normal rate and regular rhythm.     Pulses: Normal pulses.     Heart sounds: Normal heart sounds.  Pulmonary:     Effort: Pulmonary effort is normal.     Breath sounds: Normal breath sounds.  Abdominal:     General: Bowel sounds are normal.     Palpations: Abdomen is soft.     Tenderness: There is no abdominal tenderness.  Musculoskeletal:     Right lower leg: No edema.     Left lower leg: No edema.  Skin:    General: Skin is warm and dry.  Neurological:     Mental Status: He is alert and oriented to person, place, and time.  Psychiatric:        Mood and Affect: Mood normal.        Behavior: Behavior normal.        Thought Content: Thought content normal.        Judgment: Judgment normal.     Imaging: NM PET Image Initial (PI) Skull Base To Thigh  Result Date: 03/15/2023 CLINICAL DATA:  Initial treatment strategy for lymphoma. EXAM: NUCLEAR MEDICINE PET SKULL BASE TO THIGH TECHNIQUE: 13.14 mCi F-18 FDG was injected intravenously. Full-ring PET  imaging was performed from the skull base to thigh after the radiotracer. CT data was obtained and used for attenuation correction and anatomic localization. Fasting blood glucose: 101 mg/dl COMPARISON:  CT abdomen pelvis February 09, 2023 FINDINGS: Mediastinal blood pool activity: SUV max 1.9 Liver activity: SUV max 2.5 NECK: Symmetric hypermetabolic hyperplasia of the tonsils with a max SUV of 5.8 is commonly reactive. Hypermetabolic sublingual activity likely reflects phonation. No hypermetabolic cervical adenopathy. Incidental CT findings: None. CHEST: No hypermetabolic thoracic adenopathy. No hypermetabolic pulmonary nodules or masses. Incidental CT findings: None.  ABDOMEN/PELVIS: Hypermetabolic left inguinal and left external iliac lymph nodes. For reference: -hypermetabolic left inguinal lymph node measures 2.8 x 2.7 cm on image 157/4 with a max SUV of [id number] -hypermetabolic left external iliac lymph node 4 mm in short axis on image 147/4 with a max SUV of 3.4. No hypermetabolic abdominal adenopathy. No abnormal hypermetabolic activity within the liver, pancreas, adrenal glands, or spleen. Incidental CT findings: No splenomegaly. Aortic atherosclerosis. Tiny fat containing left inguinal hernia. SKELETON: Diffuse low-level FDG avidity instance max SUV in the L5 vertebral body of 2.9. Incidental CT findings: None. IMPRESSION: 1. Hypermetabolic left inguinal and left external iliac lymph nodes consistent with lymphomatous disease involvement. 2. Diffuse low-level FDG avidity throughout the axial and appendicular skeleton is nonspecific likely physiologic less likely reflecting lymphomatous involvement. 3. No splenomegaly. 4. Symmetric hypermetabolic hyperplasia of the tonsils is commonly reactive. 5. Tiny fat containing left inguinal hernia. 6. Aortic Atherosclerosis (ICD10-I70.0). Electronically Signed   By: Dahlia Bailiff M.D.   On: 03/15/2023 14:15   [place] CORE BIOPSY (LYMPH NODES)  Result Date:  02/21/2023 INDICATION: LEFT inguinal mass. EXAM: ULTRASOUND-GUIDED LEFT INGUINAL MASS BIOPSY COMPARISON:  CT AP, 02/09/2023. MEDICATIONS: None ANESTHESIA/SEDATION: Local anesthetic was administered. COMPLICATIONS: None immediate. TECHNIQUE: Informed written consent was obtained from the patient after a discussion of the risks, benefits and alternatives to treatment. Questions regarding the procedure were encouraged and answered. Initial ultrasound scanning demonstrated large LEFT inguinal mass. An ultrasound image was saved for documentation purposes. The procedure was planned. A timeout was performed prior to the initiation of the procedure. The operative was prepped and draped in the usual sterile fashion, and a sterile drape was applied covering the operative field. A timeout was performed prior to the initiation of the procedure. Local anesthesia was provided with 1% lidocaine with epinephrine. Under direct ultrasound guidance, an 18 gauge core needle device was utilized to obtain to obtain 3 core needle biopsies of the LEFT inguinal mass. The samples were placed in saline and submitted to pathology. The needle was removed and superficial hemostasis was achieved with manual compression. Post procedure scan was negative for significant hematoma. A dressing was applied. The patient tolerated the procedure well without immediate postprocedural complication. IMPRESSION: Successful ultrasound guided biopsy of the large LEFT inguinal mass. Michaelle Birks, MD [facility] Electronically Signed   By: Michaelle Birks M.D.   On: 02/21/2023 15:35    Labs:  CBC: Recent Labs    02/09/23 1855 02/13/23 1200  WBC 11.9* 6.8  HGB 15.4 15.0  HCT 46.9 45.7  PLT 280 304    COAGS: No results for input(s): "INR", "APTT" in the last 8760 hours.  BMP: Recent Labs    02/09/23 1855 02/13/23 1200  NA 136 139  K 3.8 4.0  CL 100 104  CO2 25 25  GLUCOSE 106* 96   BUN 10 11  CALCIUM 9.4 9.4  CREATININE 1.14 1.11  GFRNONAA >60 >60    LIVER FUNCTION TESTS: Recent Labs    02/09/23 1855 02/13/23 1200  BILITOT 1.1 0.7  AST 34 34  ALT 31 39  ALKPHOS 95 110  PROT 7.8 8.4*  ALBUMIN 4.3 4.3    TUMOR MARKERS: No results for input(s): "AFPTM", "CEA", "CA199", "CHROMGRNA" in the last 8760 hours.  Assessment and Plan:  Randy [name] is a 42 yo male being seen today in relation to newly diagnosed lymphoma. Patient was referred to IR for image-guided bone marrow biopsy to better characterize  patient's lymphoma. Patient presents today in his usual state of health and is NPO. Case has been reviewed with Dr Denna Haggard and is set to proceed on 03/21/23.  Risks and benefits of image-guided bone marrow biopsy was discussed with the patient and/or patient's family including, but not limited to bleeding, infection, damage to adjacent structures or low yield requiring additional tests.  All of the questions were answered and there is agreement to proceed.  Consent signed and in chart.   Thank you for this interesting consult.  I greatly enjoyed meeting Randy [name]. and look forward to participating in their care.  A copy of this report was sent to the requesting provider on this date.  Electronically Signed: Lura Em, PA 03/21/2023, 8:22 AM   I spent a total of  15 Minutes in face to face in clinical consultation, greater than 50% of which was counseling/coordinating care for lymphoma.

## 2023-03-23 LAB — SURGICAL PATHOLOGY

## 2023-03-28 ENCOUNTER — Encounter (HOSPITAL_COMMUNITY): Payer: Self-pay | Admitting: Oncology

## 2023-03-29 NOTE — Telephone Encounter (Signed)
Please schedule pt  for MD only for biopsy results. Please call pt with appt details.

## 2023-03-29 NOTE — Telephone Encounter (Signed)
-----   Message from Earlie Server, MD sent at 03/28/2023  8:53 PM EDT ----- Excisional biopsy result is back please arrange him to see me ASAP to review results.

## 2023-04-02 ENCOUNTER — Encounter: Payer: Self-pay | Admitting: Oncology

## 2023-04-02 ENCOUNTER — Inpatient Hospital Stay: Payer: 59 | Attending: Oncology | Admitting: Oncology

## 2023-04-02 VITALS — BP 143/106 | HR 76 | Temp 97.1°F | Resp 18 | Wt 249.3 lb

## 2023-04-02 DIAGNOSIS — Z79620 Long term (current) use of immunosuppressive biologic: Secondary | ICD-10-CM | POA: Insufficient documentation

## 2023-04-02 DIAGNOSIS — C8475 Anaplastic large cell lymphoma, ALK-negative, lymph nodes of inguinal region and lower limb: Secondary | ICD-10-CM | POA: Diagnosis not present

## 2023-04-02 DIAGNOSIS — Z5111 Encounter for antineoplastic chemotherapy: Secondary | ICD-10-CM | POA: Diagnosis not present

## 2023-04-02 DIAGNOSIS — Z5112 Encounter for antineoplastic immunotherapy: Secondary | ICD-10-CM | POA: Diagnosis not present

## 2023-04-02 DIAGNOSIS — C8470 Anaplastic large cell lymphoma, ALK-negative, unspecified site: Secondary | ICD-10-CM | POA: Insufficient documentation

## 2023-04-02 DIAGNOSIS — Z7189 Other specified counseling: Secondary | ICD-10-CM | POA: Diagnosis not present

## 2023-04-02 MED ORDER — ACYCLOVIR 400 MG PO TABS: 400.0000 mg | ORAL_TABLET | Freq: Two times a day (BID) | ORAL | 1 refills | Status: DC

## 2023-04-02 MED ORDER — ONDANSETRON HCL 8 MG PO TABS: 8.0000 mg | ORAL_TABLET | Freq: Three times a day (TID) | ORAL | 1 refills | Status: DC | PRN

## 2023-04-02 MED ORDER — PROCHLORPERAZINE MALEATE 10 MG PO TABS: 10.0000 mg | ORAL_TABLET | Freq: Four times a day (QID) | ORAL | 6 refills | Status: DC | PRN

## 2023-04-02 MED ORDER — PREDNISONE 20 MG PO TABS: 100.0000 mg | ORAL_TABLET | Freq: Every day | ORAL | 7 refills | Status: DC

## 2023-04-02 MED ORDER — LIDOCAINE-PRILOCAINE 2.5-2.5 % EX CREA: TOPICAL_CREAM | CUTANEOUS | 3 refills | Status: DC

## 2023-04-02 NOTE — Progress Notes (Signed)
Pt here for biopsy results. 

## 2023-04-02 NOTE — Assessment & Plan Note (Signed)
Discussed with patient

## 2023-04-02 NOTE — Progress Notes (Signed)
Hematology/Oncology Progress note [phone] [phone]      CHIEF COMPLAINTS/PURPOSE OF CONSULTATION:  ALK negative, anaplastic large cell lymphoma.   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  The diagnosis and care plan were discussed with patient in detail.   Recommend BV + CHP x 3 cycles, restage with PET Chemotherapy education was provided.  We had discussed the composition of chemotherapy regimen, length of chemo cycle, duration of treatment and the time to assess response to treatment.   I explained to the patient the risks and benefits of chemotherapy including all but not limited to infusion  reaction,, high blood glucose, confusion, hair loss, mouth sore, nausea, vomiting, diarrhea, low blood counts, bleeding, heart failure, neuropathy and risk of life threatening infection and even death, secondary malignancy etc.  . Patient voices understanding and willing to proceed chemotherapy.   # Chemotherapy education; Medi- port placement to be placed by Dr. Maia Plan.  Antiemetics-Zofran and Compazine; EMLA cream sent to pharmacy History of genitle herpes, recommend acyclovir prophylaxis. 400mg  BID  Supportive care measures are necessary for patient well-being and will be provided as necessary. We spent sufficient time to discuss many aspect of care, questions were answered to patient's satisfaction.    Goals of care, counseling/discussion Discussed with patient.    Orders Placed This Encounter  Procedures   Consent Attestation for Oncology Treatment    Order Specific Question:   The patient is informed of risks, benefits, side-effects of the prescribed oncology treatment. Potential short term and long term side effects and response rates discussed. After a long discussion, the patient made informed decision to proceed.    Answer:   Yes   CBC with Differential (Cancer Center Only)     Standing Status:   Future    Standing Expiration Date:   04/09/2024   CMP (Cancer Center only)    Standing Status:   Future    Standing Expiration Date:   04/09/2024   PHYSICIAN COMMUNICATION ORDER    A baseline Echo/ Muga should be obtained prior to initiation of Anthracycline Chemotherapy   ONCBCN PHYSICIAN COMMUNICATION 2    Dexamethasone 10 mg IV to be given in infusion prior to chemo in place of prednisone 100 mg po take home D1   Follow up TBD All questions were answered. The patient knows to call the clinic with any problems, questions or concerns.  Rickard Patience, MD, PhD [facility] Hematology Oncology 04/02/2023    HISTORY OF PRESENTING ILLNESS:  Randy [name]. 42 y.o. male presents to establish care for left inguinal mass. Patient presented to emergency room on 02/09/2023 complaining about painful bulging to his left groin. Patient has noticed left inguinal mass for at least a year, mass is gradually enlarging, some tenderness. 02/09/2023 CT abdomen pelvis with contrast showed massive lymphadenopathy of left inguinal groin, largest measures 6.1 x 5.5 x 4.4 cm  Smalt fat containing left inguinal hernia.  5 mm left solid pulmonary nodule.  Urine was negative for chlamydia and gonorrhea  patient was given empiric antibiotics and referred to establish care with oncology for further evaluation.  Patient denies any unintentional weight loss, fever, night sweats.  He reports having " breakout of genital herpes" recently.  He does not have primary care provider.  He reports that he was told that he has genital herpes many years ago.  He is a poor historian and not able to provide detailed history. Reviewed previous  medical history. 01/08/2014-01/08/2021, patient presented emergency room for headache,.  LP was done which showed lymphocytic predominant pleocytosis, HSV 2+, RPR reactive 1:4, HIV and crypto Ag negative.  He was treated with HSV meningitis with acyclovir.  Patient also was treated  for skin eruptions secondary syphilis with penicillin injections.  02/21/2023, left inguinal lymph node biopsy showed CD30 positive lymphoma, favor ALK negative anaplastic large cell lymphoma, of note, cannot rule out classic Hodgkin's lymphoma.  Excisional biopsy was recommended.  03/12/2023, left inguinal excisional lymph node biopsy showed CD30 positive T-cell lymphoma, most consistent with ALK negative anaplastic large cell lymphoma.  03/14/2023, PET scan showed  1. Hypermetabolic left inguinal and left external iliac lymph nodes consistent with lymphomatous disease involvement. 2. Diffuse low-level FDG avidity throughout the axial and appendicular skeleton is nonspecific likely physiologic less likely reflecting lymphomatous involvement. 3. No splenomegaly. 4. Symmetric hypermetabolic hyperplasia of the tonsils is commonly reactive. 5. Tiny fat containing left inguinal hernia. 6. Aortic Atherosclerosis    03/21/2023, bone marrow biopsy showed normocellular bone marrow for age with trilineage hematopoiesis.  No significant CD34 positive blastic population identified.  No monoclonal B-cell population or significant T-cell abnormalities identified.   Today patient presents to discuss results and management plan.  Echocardiogram is scheduled tomorrow.  Patient is accompanied by spouse  MEDICAL HISTORY:  Past Medical History:  Diagnosis Date   Migraines    Seizures     SURGICAL HISTORY: Past Surgical History:  Procedure Laterality Date   INGUINAL LYMPH NODE BIOPSY Left 03/12/2023   Procedure: INGUINAL LYMPH NODE BIOPSY excisional;  Surgeon: Carolan Shiver, MD;  Location: [facility] ORS;  Service: General;  Laterality: Left;   IR BONE MARROW BIOPSY & ASPIRATION  03/21/2023    SOCIAL HISTORY: Social History   Socioeconomic History   Marital status: Single    Spouse name: Not on file   Number of children: Not on file   Years of education: Not on file   Highest education level: Not  on file  Occupational History   Not on file  Tobacco Use   Smoking status: Former    Years: 15    Types: Cigarettes    Quit date: 03/25/2022    Years since quitting: 1.0   Smokeless tobacco: Not on file  Vaping Use   Vaping Use: Every day   Start date: 03/27/2023  Substance and Sexual Activity   Alcohol use: Yes   Drug use: No   Sexual activity: Not on file  Other Topics Concern   Not on file  Social History Narrative   Not on file   Social Determinants of Health   Financial Resource Strain: Low Risk  (02/13/2023)   Overall Financial Resource Strain (CARDIA)    Difficulty of Paying Living Expenses: Not hard at all  Food Insecurity: No Food Insecurity (02/13/2023)   Hunger Vital Sign    Worried About Running Out of Food in the Last Year: Never true    Ran Out of Food in the Last Year: Never true  Transportation Needs: No Transportation Needs (02/13/2023)   PRAPARE - Administrator, Civil Service (Medical): No    Lack of Transportation (Non-Medical): No  Physical Activity: Not on file  Stress: No Stress Concern Present (02/13/2023)   [employer] of Occupational Health - Occupational Stress Questionnaire    Feeling of Stress : Not at all  Social Connections: Not on file  Intimate Partner Violence: Not At Risk (02/13/2023)   Humiliation, Afraid, Rape, and  Kick questionnaire    Fear of Current or Ex-Partner: No    Emotionally Abused: No    Physically Abused: No    Sexually Abused: No    FAMILY HISTORY: Family History  Problem Relation Age of Onset   Stroke Father    Diabetes Father     ALLERGIES:  has No Known Allergies.  MEDICATIONS:  Current Outpatient Medications  Medication Sig Dispense Refill   Aspirin-Salicylamide-Caffeine (BC HEADACHE POWDER PO) Take 1 packet by mouth daily as needed (pain). (Patient not taking: Reported on 02/13/2023)     doxycycline (VIBRA-TABS) 100 MG tablet Take 1 tablet (100 mg total) by mouth 2 (two) times daily. (Patient  not taking: Reported on 03/12/2023) 20 tablet 0   fluconazole (DIFLUCAN) 100 MG tablet Take 1 tablet (100 mg total) by mouth once. Take 2 tabs po x 1, then start 1 tab po qd (Patient not taking: Reported on 04/20/2017) 31 tablet 0   hydrOXYzine (ATARAX/VISTARIL) 25 MG tablet Take 0.5-1 tablets (12.5-25 mg total) by mouth every 8 (eight) hours as needed for itching. (Patient not taking: Reported on 04/20/2017) 30 tablet 0   mupirocin ointment (BACTROBAN) 2 % Apply 1 application topically 3 (three) times daily. (Patient not taking: Reported on 04/20/2017) 30 g 1   traMADol (ULTRAM) 50 MG tablet Take 1 tablet (50 mg total) by mouth every 6 (six) hours as needed. (Patient not taking: Reported on 03/12/2023) 10 tablet 0   triamcinolone cream (KENALOG) 0.1 % Apply 1 application topically 2 (two) times daily. (Patient not taking: Reported on 04/20/2017) 30 g 0   No current facility-administered medications for this visit.    Review of Systems  Constitutional:  Negative for appetite change, chills, fatigue, fever and unexpected weight change.  HENT:   Negative for hearing loss and voice change.   Eyes:  Negative for eye problems and icterus.  Respiratory:  Negative for chest tightness, cough and shortness of breath.   Cardiovascular:  Negative for chest pain and leg swelling.  Gastrointestinal:  Negative for abdominal distention and abdominal pain.  Endocrine: Negative for hot flashes.  Genitourinary:  Negative for difficulty urinating, dysuria and frequency.        Left inguinal mass  Musculoskeletal:  Negative for arthralgias.  Skin:  Negative for itching and rash.  Neurological:  Negative for light-headedness and numbness.  Hematological:  Negative for adenopathy. Does not bruise/bleed easily.  Psychiatric/Behavioral:  Negative for confusion.      PHYSICAL EXAMINATION: ECOG PERFORMANCE STATUS: 0 - Asymptomatic  Vitals:   04/02/23 1314  BP: (!) 143/106  Pulse: 76  Resp: 18  Temp: (!) 97.1 F  (36.2 C)   Filed Weights   04/02/23 1314  Weight: 249 lb 4.8 oz (113.1 kg)    Physical Exam Constitutional:      General: He is not in acute distress. HENT:     Head: Normocephalic and atraumatic.  Eyes:     General: No scleral icterus.    Pupils: Pupils are equal, round, and reactive to light.  Cardiovascular:     Rate and Rhythm: Normal rate and regular rhythm.     Heart sounds: No murmur heard. Pulmonary:     Effort: Pulmonary effort is normal. No respiratory distress.  Abdominal:     General: There is no distension.  Genitourinary:    Comments: Left inguinal massive lymphadenopathy, fixed  Musculoskeletal:        General: Normal range of motion.     Cervical back: Normal  range of motion and neck supple.  Skin:    General: Skin is warm and dry.     Findings: No erythema.  Neurological:     Mental Status: He is alert and oriented to person, place, and time. Mental status is at baseline.     Cranial Nerves: No cranial nerve deficit.     Motor: No abnormal muscle tone.  Psychiatric:        Mood and Affect: Mood and affect normal.     RN Merleen Nicely served as chaperone during GU examination LABORATORY DATA:  I have reviewed the data as listed    Latest Ref Rng & Units 03/21/2023    8:13 AM 02/13/2023   12:00 PM 02/09/2023    6:55 PM  CBC  WBC 4.0 - 10.5 K/uL 9.3  6.8  11.9   Hemoglobin 13.0 - 17.0 g/dL 16.1  09.6  04.5   Hematocrit 39.0 - 52.0 % 47.6  45.7  46.9   Platelets 150 - 400 K/uL 278  304  280       Latest Ref Rng & Units 02/13/2023   12:00 PM 02/09/2023    6:55 PM 04/20/2017    2:09 PM  CMP  Glucose 70 - 99 mg/dL 96  409  811   BUN 6 - 20 mg/dL 11  10  11    Creatinine 0.61 - 1.24 mg/dL 9.14  7.82  9.56   Sodium 135 - 145 mmol/L 139  136  140   Potassium 3.5 - 5.1 mmol/L 4.0  3.8  4.0   Chloride 98 - 111 mmol/L 104  100  104   CO2 22 - 32 mmol/L 25  25  26    Calcium 8.9 - 10.3 mg/dL 9.4  9.4  21.3   Total Protein 6.5 - 8.1 g/dL 8.4  7.8     Total Bilirubin 0.3 - 1.2 mg/dL 0.7  1.1    Alkaline Phos 38 - 126 U/L 110  95    AST 15 - 41 U/L 34  34    ALT 0 - 44 U/L 39  31       RADIOGRAPHIC STUDIES: I have personally reviewed the radiological images as listed and agreed with the findings in the report. IR BONE MARROW BIOPSY & ASPIRATION  Result Date: 03/21/2023 INDICATION: lymphadenopathy EXAM: IR BONE MARROW BIOPSY AND ASPIRATION MEDICATIONS: None. ANESTHESIA/SEDATION: Moderate (conscious) sedation was employed during this procedure. A total of Versed 2 mg and Fentanyl 100 mcg was administered intravenously. Moderate Sedation Time: 10 minutes. The patient's level of consciousness and vital signs were monitored continuously by radiology nursing throughout the procedure under my direct supervision. FLUOROSCOPY TIME:  Fluoroscopy Time: 0.6 minutes (10 mGy) COMPLICATIONS: None immediate. PROCEDURE: Informed written consent was obtained from the patient after a thorough discussion of the procedural risks, benefits and alternatives. All questions were addressed. Maximal Sterile Barrier Technique was utilized including caps, mask, sterile gowns, sterile gloves, sterile drape, hand hygiene and skin antiseptic. A timeout was performed prior to the initiation of the procedure. The patient was placed prone on the IR exam table. Limited fluoroscopy of the pelvis was performed for planning purposes. Skin entry site was marked, and the overlying skin was prepped and draped in the standard sterile fashion. Local analgesia was obtained with 1% lidocaine. Using fluoroscopic guidance, an 11 gauge needle was advanced just deep to the cortex of the right posterior ilium. Subsequently, bone marrow aspiration and core biopsy were performed. Dry tap was noted.  An additional core was obtained. Specimens were submitted to lab/pathology for handling. Hemostasis was achieved with manual pressure, and a clean dressing was placed. The patient tolerated the procedure well  without immediate complication. IMPRESSION: Successful bone marrow core biopsy using fluoroscopic guidance. Attempts at bone marrow aspiration yielded a dry tap. Electronically Signed   By: Olive BassYasser  El-Abd M.D.   On: 03/21/2023 11:23   NM PET Image Initial (PI) Skull Base To Thigh  Result Date: 03/15/2023 CLINICAL DATA:  Initial treatment strategy for lymphoma. EXAM: NUCLEAR MEDICINE PET SKULL BASE TO THIGH TECHNIQUE: 13.14 mCi F-18 FDG was injected intravenously. Full-ring PET imaging was performed from the skull base to thigh after the radiotracer. CT data was obtained and used for attenuation correction and anatomic localization. Fasting blood glucose: 101 mg/dl COMPARISON:  CT abdomen pelvis February 09, 2023 FINDINGS: Mediastinal blood pool activity: SUV max 1.9 Liver activity: SUV max 2.5 NECK: Symmetric hypermetabolic hyperplasia of the tonsils with a max SUV of 5.8 is commonly reactive. Hypermetabolic sublingual activity likely reflects phonation. No hypermetabolic cervical adenopathy. Incidental CT findings: None. CHEST: No hypermetabolic thoracic adenopathy. No hypermetabolic pulmonary nodules or masses. Incidental CT findings: None. ABDOMEN/PELVIS: Hypermetabolic left inguinal and left external iliac lymph nodes. For reference: -hypermetabolic left inguinal lymph node measures 2.8 x 2.7 cm on image 157/4 with a max SUV of 17.7 -hypermetabolic left external iliac lymph node 4 mm in short axis on image 147/4 with a max SUV of 3.4. No hypermetabolic abdominal adenopathy. No abnormal hypermetabolic activity within the liver, pancreas, adrenal glands, or spleen. Incidental CT findings: No splenomegaly. Aortic atherosclerosis. Tiny fat containing left inguinal hernia. SKELETON: Diffuse low-level FDG avidity instance max SUV in the L5 vertebral body of 2.9. Incidental CT findings: None. IMPRESSION: 1. Hypermetabolic left inguinal and left external iliac lymph nodes consistent with lymphomatous disease  involvement. 2. Diffuse low-level FDG avidity throughout the axial and appendicular skeleton is nonspecific likely physiologic less likely reflecting lymphomatous involvement. 3. No splenomegaly. 4. Symmetric hypermetabolic hyperplasia of the tonsils is commonly reactive. 5. Tiny fat containing left inguinal hernia. 6. Aortic Atherosclerosis (ICD10-I70.0). Electronically Signed   By: Maudry MayhewJeffrey  Waltz M.D.   On: 03/15/2023 14:15

## 2023-04-02 NOTE — Assessment & Plan Note (Addendum)
Stage II ALK- anaplastic large cell lymphoma.  The diagnosis and care plan were discussed with patient in detail.   Recommend BV + CHP x 3 cycles, restage with PET Chemotherapy education was provided.  We had discussed the composition of chemotherapy regimen, length of chemo cycle, duration of treatment and the time to assess response to treatment.   I explained to the patient the risks and benefits of chemotherapy including all but not limited to infusion  reaction,, high blood glucose, confusion, hair loss, mouth sore, nausea, vomiting, diarrhea, low blood counts, bleeding, heart failure, neuropathy and risk of life threatening infection and even death, secondary malignancy etc.  . Patient voices understanding and willing to proceed chemotherapy.   # Chemotherapy education; Medi- port placement to be placed by Dr. Maia Plan.  Antiemetics-Zofran and Compazine; EMLA cream sent to pharmacy History of genitle herpes, recommend acyclovir prophylaxis. 400mg  BID  Supportive care measures are necessary for patient well-being and will be provided as necessary. We spent sufficient time to discuss many aspect of care, questions were answered to patient's satisfaction.

## 2023-04-02 NOTE — Progress Notes (Signed)
START ON PATHWAY REGIMEN - Lymphoma and CLL     A cycle is every 21 days:     Cyclophosphamide      Doxorubicin      Prednisone      Brentuximab vedotin   **Always confirm dose/schedule in your pharmacy ordering system**  Patient Characteristics: T-Cell Lymphoma (Systemic), First Line, AITL (Angioimmunoblastic T-Cell Lymphoma) or ALCL (Anaplastic Large Cell Lymphoma) or Peripheral T-Cell, NOS, CD30 Positive Disease Type: Not Applicable Disease Type: T-Cell Lymphoma (Systemic) Disease Type: Not Applicable Line of Therapy: First Line T-Cell Lymphoma Subtype: ALCL (Anaplastic Large Cell Lymphoma) CD30 Status: CD30 Positive Intent of Therapy: Curative Intent, Discussed with Patient 

## 2023-04-03 ENCOUNTER — Telehealth: Payer: Self-pay | Admitting: Oncology

## 2023-04-03 ENCOUNTER — Ambulatory Visit
Admission: RE | Admit: 2023-04-03 | Discharge: 2023-04-03 | Disposition: A | Discharge status: Home / Self Care | Payer: 59 | Source: Ambulatory Visit | Attending: Oncology | Admitting: Oncology

## 2023-04-03 ENCOUNTER — Telehealth: Payer: Self-pay

## 2023-04-03 ENCOUNTER — Encounter: Payer: Self-pay | Admitting: Oncology

## 2023-04-03 ENCOUNTER — Other Ambulatory Visit: Payer: Self-pay

## 2023-04-03 DIAGNOSIS — I427 Cardiomyopathy due to drug and external agent: Secondary | ICD-10-CM | POA: Insufficient documentation

## 2023-04-03 DIAGNOSIS — I517 Cardiomegaly: Secondary | ICD-10-CM | POA: Insufficient documentation

## 2023-04-03 DIAGNOSIS — C8590 Non-Hodgkin lymphoma, unspecified, unspecified site: Secondary | ICD-10-CM | POA: Diagnosis not present

## 2023-04-03 LAB — ECHOCARDIOGRAM COMPLETE
AR max vel: 3.28 cm2
AV Area VTI: 2.67 cm2
AV Area mean vel: 2.95 cm2
AV Mean grad: 4 mmHg
AV Peak grad: 7 mmHg
Ao pk vel: 1.32 m/s
Area-P 1/2: 3.81 cm2
MV VTI: 2.79 cm2
S' Lateral: 3.2 cm

## 2023-04-03 NOTE — Progress Notes (Signed)
*  PRELIMINARY RESULTS* Echocardiogram 2D Echocardiogram has been performed.  Carolyne Fiscal 04/03/2023, 10:01 AM

## 2023-04-03 NOTE — Telephone Encounter (Signed)
-----   Message from Rickard Patience, MD sent at 04/02/2023  6:43 PM EDT ----- Dr. Maia Plan plans to place medi port on 4/17, plan lab MD BV BHP with D3 GCSF on 4/18 Chemo IS updated. Thanks.

## 2023-04-03 NOTE — Telephone Encounter (Signed)
This patient called to let us know that Dr. Maia Plan can't put his port in until 4/17. He would like to know if he needs to change his appointments. Please advise.

## 2023-04-03 NOTE — Telephone Encounter (Signed)
Chemo IS updated, per MD. Please inform pt of new tx appt once scheduled.

## 2023-04-04 ENCOUNTER — Other Ambulatory Visit: Payer: Self-pay

## 2023-04-05 ENCOUNTER — Encounter: Payer: Self-pay | Admitting: Oncology

## 2023-04-06 LAB — SURGICAL PATHOLOGY

## 2023-04-09 ENCOUNTER — Encounter
Admission: RE | Admit: 2023-04-09 | Discharge: 2023-04-09 | Disposition: A | Discharge status: Home / Self Care | Payer: 59 | Source: Ambulatory Visit | Attending: General Surgery | Admitting: General Surgery

## 2023-04-09 ENCOUNTER — Inpatient Hospital Stay: Payer: 59

## 2023-04-09 ENCOUNTER — Ambulatory Visit: Payer: Self-pay | Admitting: General Surgery

## 2023-04-09 ENCOUNTER — Other Ambulatory Visit: Payer: Self-pay

## 2023-04-09 HISTORY — DX: Anaplastic large cell lymphoma, alk-negative, lymph nodes of inguinal region and lower limb: C84.75

## 2023-04-09 NOTE — Patient Instructions (Addendum)
Your procedure is scheduled on: 04/11/2023 Report to the Registration Desk on the 1st floor of the [facility]. To find out your arrival time, please call [phone] between 1PM - 3PM on:04/10/2023  If your arrival time is 6:00 am, do not arrive before that time as the [facility] entrance doors do not open until 6:00 am.  REMEMBER: Instructions that are not followed completely may result in serious medical risk, up to and including death; or upon the discretion of your surgeon and anesthesiologist your surgery may need to be rescheduled.  Do not eat food after midnight the night before surgery.  No gum chewing or hard candies.   One week prior to surgery: Stop Anti-inflammatories (NSAIDS) such as Advil, Aleve, Ibuprofen, Motrin, Naproxen, Naprosyn and Aspirin based products such as Excedrin, Goody's Powder, BC Powder. Stop ANY OVER THE COUNTER supplements until after surgery. You may however, continue to take Tylenol if needed for pain up until the day of surgery.  Continue taking all prescribed medications with the exception of the following:    Follow recommendations from Cardiologist or PCP regarding stopping blood thinners.  TAKE ONLY THESE MEDICATIONS THE MORNING OF SURGERY WITH A SIP OF WATER:  acyclovir (ZOVIRAX) - allowed to take     No Alcohol for 24 hours before or after surgery.  No Smoking including e-cigarettes for 24 hours before surgery.  No chewable tobacco products for at least 6 hours before surgery.  No nicotine patches on the day of surgery.  Do not use any "recreational" drugs for at least a week (preferably 2 weeks) before your surgery.  Please be advised that the combination of cocaine and anesthesia may have negative outcomes, up to and including death. If you test positive for cocaine, your surgery will be cancelled.  On the morning of surgery brush your teeth with toothpaste and water, you may rinse your mouth with mouthwash if you wish. Do not  swallow any toothpaste or mouthwash.  Use CHG Soap  as directed on instruction sheet.- provided for you   Do not wear jewelry, make-up, hairpins, clips or nail polish.  Do not wear lotions, powders, or perfumes.   Do not shave body hair from the neck down 48 hours before surgery.  Contact lenses, hearing aids and dentures may not be worn into surgery.  Do not bring valuables to the hospital. [facility] is not responsible for any missing/lost belongings or valuables.    Notify your doctor if there is any change in your medical condition (cold, fever, infection).  Wear comfortable clothing (specific to your surgery type) to the hospital.  After surgery, you can help prevent lung complications by doing breathing exercises.  Take deep breaths and cough every 1-2 hours. Your doctor may order a device called an Incentive Spirometer to help you take deep breaths. If you are being admitted to the hospital overnight, leave your suitcase in the car. After surgery it may be brought to your room.    If you are being discharged the day of surgery, you will not be allowed to drive home. You will need a responsible individual to drive you home and stay with you for 24 hours after surgery.   If you are taking public transportation, you will need to have a responsible individual with you.  Please call the Pre-admissions Testing Dept. at [phone] if you have any questions about these instructions.  Surgery Visitation Policy:  Patients having surgery or a procedure may have two visitors.  Preparing for Surgery with CHLORHEXIDINE GLUCONATE (CHG) Soap  Chlorhexidine Gluconate (CHG) Soap  o An antiseptic cleaner that kills germs and bonds with the skin to continue killing germs even after washing  o Used for showering the night before surgery and morning of surgery  Before surgery, you can play an important role by reducing the number of germs on your skin.  CHG (Chlorhexidine  gluconate) soap is an antiseptic cleanser which kills germs and bonds with the skin to continue killing germs even after washing.  Please do not use if you have an allergy to CHG or antibacterial soaps. If your skin becomes reddened/irritated stop using the CHG.  1. Shower the NIGHT BEFORE SURGERY and the MORNING OF SURGERY with CHG soap.  2. If you choose to wash your hair, wash your hair first as usual with your normal shampoo.  3. After shampooing, rinse your hair and body thoroughly to remove the shampoo.  4. Use CHG as you would any other liquid soap. You can apply CHG directly to the skin and wash gently with a scrungie or a clean washcloth.  5. Apply the CHG soap to your body only from the neck down. Do not use on open wounds or open sores. Avoid contact with your eyes, ears, mouth, and genitals (private parts). Wash face and genitals (private parts) with your normal soap.  6. Wash thoroughly, paying special attention to the area where your surgery will be performed.  7. Thoroughly rinse your body with warm water.  8. Do not shower/wash with your normal soap after using and rinsing off the CHG soap.  9. Pat yourself dry with a clean towel.  10. Wear clean pajamas to bed the night before surgery.  12. Place clean sheets on your bed the night of your first shower and do not sleep with pets.  13. Shower again with the CHG soap on the day of surgery prior to arriving at the hospital.  14. Do not apply any deodorants/lotions/powders.  15. Please wear clean clothes to the hospital. Children under the age of 68 must have an adult with them who is not the patient.

## 2023-04-10 ENCOUNTER — Ambulatory Visit: Payer: 59

## 2023-04-10 ENCOUNTER — Ambulatory Visit: Payer: 59 | Admitting: Oncology

## 2023-04-10 ENCOUNTER — Other Ambulatory Visit: Payer: 59

## 2023-04-11 ENCOUNTER — Ambulatory Visit: Payer: 59 | Admitting: Anesthesiology

## 2023-04-11 ENCOUNTER — Encounter
Admission: RE | Disposition: A | Discharge status: Home / Self Care | Payer: Self-pay | Source: Home / Self Care | Attending: General Surgery

## 2023-04-11 ENCOUNTER — Ambulatory Visit
Admission: RE | Admit: 2023-04-11 | Discharge: 2023-04-11 | Disposition: A | Discharge status: Home / Self Care | Payer: 59 | Attending: General Surgery | Admitting: General Surgery

## 2023-04-11 ENCOUNTER — Other Ambulatory Visit: Payer: Self-pay

## 2023-04-11 ENCOUNTER — Ambulatory Visit: Payer: 59

## 2023-04-11 ENCOUNTER — Encounter: Payer: Self-pay | Admitting: General Surgery

## 2023-04-11 DIAGNOSIS — C8590 Non-Hodgkin lymphoma, unspecified, unspecified site: Secondary | ICD-10-CM | POA: Diagnosis not present

## 2023-04-11 DIAGNOSIS — Z452 Encounter for adjustment and management of vascular access device: Secondary | ICD-10-CM | POA: Diagnosis not present

## 2023-04-11 DIAGNOSIS — E876 Hypokalemia: Secondary | ICD-10-CM | POA: Diagnosis not present

## 2023-04-11 DIAGNOSIS — C8475 Anaplastic large cell lymphoma, ALK-negative, lymph nodes of inguinal region and lower limb: Secondary | ICD-10-CM | POA: Diagnosis not present

## 2023-04-11 HISTORY — PX: PORTACATH PLACEMENT: SHX2246

## 2023-04-11 SURGERY — INSERTION, TUNNELED CENTRAL VENOUS DEVICE, WITH PORT
Anesthesia: General | Site: Chest

## 2023-04-11 MED ORDER — MIDAZOLAM HCL 2 MG/2ML IJ SOLN
INTRAMUSCULAR | Status: DC | PRN
  Administered 2023-04-11: 2 mg via INTRAVENOUS

## 2023-04-11 MED ORDER — FAMOTIDINE 20 MG PO TABS
20.0000 mg | ORAL_TABLET | Freq: Once | ORAL | Status: AC
  Administered 2023-04-11: 20 mg via ORAL

## 2023-04-11 MED ORDER — PROPOFOL 1000 MG/100ML IV EMUL
INTRAVENOUS | Status: AC
  Filled 2023-04-11: qty 100

## 2023-04-11 MED ORDER — CHLORHEXIDINE GLUCONATE 0.12 % MT SOLN
15.0000 mL | Freq: Once | OROMUCOSAL | Status: AC
  Administered 2023-04-11: 15 mL via OROMUCOSAL

## 2023-04-11 MED ORDER — BUPIVACAINE HCL (PF) 0.25 % IJ SOLN
INTRAMUSCULAR | Status: AC
  Filled 2023-04-11: qty 30

## 2023-04-11 MED ORDER — BUPIVACAINE HCL 0.25 % IJ SOLN
INTRAMUSCULAR | Status: DC | PRN
  Administered 2023-04-11: 20 mL

## 2023-04-11 MED ORDER — CHLORHEXIDINE GLUCONATE 0.12 % MT SOLN
OROMUCOSAL | Status: AC
  Filled 2023-04-11: qty 15

## 2023-04-11 MED ORDER — ONDANSETRON HCL 4 MG/2ML IJ SOLN
INTRAMUSCULAR | Status: DC | PRN
  Administered 2023-04-11: 4 mg via INTRAVENOUS

## 2023-04-11 MED ORDER — MIDAZOLAM HCL 2 MG/2ML IJ SOLN
INTRAMUSCULAR | Status: AC
  Filled 2023-04-11: qty 2

## 2023-04-11 MED ORDER — PROPOFOL 500 MG/50ML IV EMUL
INTRAVENOUS | Status: DC | PRN
  Administered 2023-04-11: 20 ug/kg/min via INTRAVENOUS

## 2023-04-11 MED ORDER — HYDRALAZINE HCL 20 MG/ML IJ SOLN
10.0000 mg | Freq: Once | INTRAMUSCULAR | Status: AC
  Administered 2023-04-11: 10 mg via INTRAVENOUS

## 2023-04-11 MED ORDER — HYDRALAZINE HCL 20 MG/ML IJ SOLN
INTRAMUSCULAR | Status: AC
  Filled 2023-04-11: qty 1

## 2023-04-11 MED ORDER — CEFAZOLIN SODIUM-DEXTROSE 2-4 GM/100ML-% IV SOLN
INTRAVENOUS | Status: AC
  Filled 2023-04-11: qty 100

## 2023-04-11 MED ORDER — FENTANYL CITRATE (PF) 100 MCG/2ML IJ SOLN: 25.0000 ug | INTRAMUSCULAR | Status: DC | PRN

## 2023-04-11 MED ORDER — FENTANYL CITRATE (PF) 100 MCG/2ML IJ SOLN
INTRAMUSCULAR | Status: AC
  Filled 2023-04-11: qty 2

## 2023-04-11 MED ORDER — FAMOTIDINE 20 MG PO TABS
ORAL_TABLET | ORAL | Status: AC
  Filled 2023-04-11: qty 1

## 2023-04-11 MED ORDER — OXYCODONE HCL 5 MG PO TABS
ORAL_TABLET | ORAL | Status: AC
  Filled 2023-04-11: qty 1

## 2023-04-11 MED ORDER — SODIUM CHLORIDE 0.9 % IV SOLN
INTRAVENOUS | Status: DC | PRN
  Administered 2023-04-11: 9 mL via INTRAMUSCULAR

## 2023-04-11 MED ORDER — LACTATED RINGERS IV SOLN: INTRAVENOUS | Status: DC

## 2023-04-11 MED ORDER — FENTANYL CITRATE (PF) 100 MCG/2ML IJ SOLN
INTRAMUSCULAR | Status: DC | PRN
  Administered 2023-04-11 (×2): 50 ug via INTRAVENOUS

## 2023-04-11 MED ORDER — CEFAZOLIN SODIUM-DEXTROSE 2-4 GM/100ML-% IV SOLN
2.0000 g | INTRAVENOUS | Status: AC
  Administered 2023-04-11: 2 g via INTRAVENOUS

## 2023-04-11 MED ORDER — ORAL CARE MOUTH RINSE: 15.0000 mL | Freq: Once | OROMUCOSAL | Status: AC

## 2023-04-11 MED ORDER — OXYCODONE HCL 5 MG/5ML PO SOLN: 5.0000 mg | Freq: Once | ORAL | Status: AC | PRN

## 2023-04-11 MED ORDER — OXYCODONE HCL 5 MG PO TABS
5.0000 mg | ORAL_TABLET | Freq: Once | ORAL | Status: AC | PRN
  Administered 2023-04-11: 5 mg via ORAL

## 2023-04-11 MED ORDER — SODIUM CHLORIDE (PF) 0.9 % IJ SOLN
INTRAMUSCULAR | Status: AC
  Filled 2023-04-11: qty 50

## 2023-04-11 MED ORDER — HEPARIN SODIUM (PORCINE) 5000 UNIT/ML IJ SOLN
INTRAMUSCULAR | Status: AC
  Filled 2023-04-11: qty 1

## 2023-04-11 MED FILL — Fosaprepitant Dimeglumine For IV Infusion 150 MG (Base Eq): INTRAVENOUS | Qty: 5 | Status: AC

## 2023-04-11 MED FILL — Dexamethasone Sodium Phosphate Inj 100 MG/10ML: INTRAMUSCULAR | Qty: 1 | Status: AC

## 2023-04-11 SURGICAL SUPPLY — 35 items
ADH SKN CLS APL DERMABOND .7 (GAUZE/BANDAGES/DRESSINGS) ×1
BAG DECANTER FOR FLEXI CONT (MISCELLANEOUS) ×1 IMPLANT
BLADE SURG 15 STRL LF DISP TIS (BLADE) ×1 IMPLANT
BLADE SURG 15 STRL SS (BLADE)
BLADE SURG SZ11 CARB STEEL (BLADE) ×1 IMPLANT
BOOT SUTURE VASCULAR YLW (MISCELLANEOUS) ×1
CLAMP SUTURE YELLOW 5 PAIRS (MISCELLANEOUS) ×1 IMPLANT
DERMABOND ADVANCED .7 DNX12 (GAUZE/BANDAGES/DRESSINGS) ×1 IMPLANT
DRAPE C-ARM XRAY 36X54 (DRAPES) ×1 IMPLANT
ELECT REM PT RETURN 9FT ADLT (ELECTROSURGICAL) ×1
ELECTRODE REM PT RTRN 9FT ADLT (ELECTROSURGICAL) ×1 IMPLANT
GLOVE BIO SURGEON STRL SZ 6.5 (GLOVE) ×1 IMPLANT
GLOVE BIOGEL PI IND STRL 6.5 (GLOVE) ×1 IMPLANT
GOWN STRL REUS W/ TWL LRG LVL3 (GOWN DISPOSABLE) ×2 IMPLANT
GOWN STRL REUS W/TWL LRG LVL3 (GOWN DISPOSABLE) ×3
IV NS 500ML (IV SOLUTION) ×1
IV NS 500ML BAXH (IV SOLUTION) ×1 IMPLANT
KIT PORT POWER 8FR ISP CVUE (Port) ×1 IMPLANT
KIT TURNOVER KIT A (KITS) ×1 IMPLANT
LABEL OR SOLS (LABEL) ×1 IMPLANT
MANIFOLD NEPTUNE II (INSTRUMENTS) ×1 IMPLANT
NDL FILTER BLUNT 18X1 1/2 (NEEDLE) ×1 IMPLANT
NEEDLE FILTER BLUNT 18X1 1/2 (NEEDLE) ×1 IMPLANT
PACK PORT-A-CATH (MISCELLANEOUS) ×1 IMPLANT
SUT MNCRL AB 4-0 PS2 18 (SUTURE) ×1 IMPLANT
SUT PROLENE 2 0 FS (SUTURE) ×1 IMPLANT
SUT VIC AB 2-0 SH 27 (SUTURE) ×1
SUT VIC AB 2-0 SH 27XBRD (SUTURE) ×1 IMPLANT
SUT VIC AB 3-0 SH 27 (SUTURE) ×1
SUT VIC AB 3-0 SH 27X BRD (SUTURE) ×1 IMPLANT
SYR 10ML LL (SYRINGE) ×2 IMPLANT
SYR 3ML LL SCALE MARK (SYRINGE) ×1 IMPLANT
TAG SUTURE CLAMP YLW 5PR (MISCELLANEOUS) ×1
TRAP FLUID SMOKE EVACUATOR (MISCELLANEOUS) ×1 IMPLANT
WATER STERILE IRR 500ML POUR (IV SOLUTION) ×1 IMPLANT

## 2023-04-11 NOTE — Progress Notes (Signed)
Called dr Lorette Ang and he came to bedside. Informed him of BP, pt also came in with elevated BP; does not have PCP or take meds for BP at baseline.  Ok for discharge per dr Lorette Ang since arrived with similar BP.  Informed pt/wife of reasons to present to ED.   Called and got pt a new pt visit at [facility] to establish primary care and be evaluated for BP.

## 2023-04-11 NOTE — Anesthesia Postprocedure Evaluation (Signed)
Anesthesia Post Note  Patient: Randy [name].  Procedure(s) Performed: INSERTION PORT-A-CATH (Chest)  Patient location during evaluation: PACU Anesthesia Type: General Level of consciousness: awake and alert Pain management: pain level controlled Vital Signs Assessment: post-procedure vital signs reviewed and stable Respiratory status: spontaneous breathing, nonlabored ventilation, respiratory function stable and patient connected to nasal cannula oxygen Cardiovascular status: blood pressure returned to baseline and stable Postop Assessment: no apparent nausea or vomiting Anesthetic complications: no Comments: Patient's blood pressure post op was elevated but in line with his pre op blood pressure of 155/90. Patient stated he does not have a PCP. Discussed the risks of chronic high blood pressure and encouraged him to make an appointment with a PCP for his blood pressure. Discussed the need to go to the ED for any symptoms of chest pain, shortness of breath or dizzyness. Patient agreed.   No notable events documented.   Last Vitals:  Vitals:   04/11/23 1100 04/11/23 1113  BP:  (!) 152/115  Pulse: 66 68  Resp: 13 14  Temp: 36.6 C (!) 36 C  SpO2: 99% 99%    Last Pain:  Vitals:   04/11/23 1113  TempSrc: Temporal  PainSc: 2                  Stephanie Coup

## 2023-04-11 NOTE — Op Note (Signed)
SURGICAL PROCEDURE REPORT  DATE OF PROCEDURE: 04/11/2023   SURGEON: Dr. Hazle Quant   ANESTHESIA: Local with light IV sedation   PRE-OPERATIVE DIAGNOSIS: Advanced lymphoma requiring durable central venous access for chemotherapy   POST-OPERATIVE DIAGNOSIS: Same  PROCEDURE(S): (cpt: 36561) 1.) Percutaneous access of Right internal jugular vein under ultrasound guidance 2.) Insertion of tunneled Right internal jugular central venous catheter with subcutaneous port  INTRAOPERATIVE FINDINGS: Patent easily compressible Right internal jugular vein with appropriate respiratory variations and well-secured tunneled central venous catheter with subcutaneous port at completion of the procedure  ESTIMATED BLOOD LOSS: Minimal (<20 mL)   SPECIMENS: None   IMPLANTS: 102F tunneled Bard PowerPort central venous catheter with subcutaneous port  DRAINS: None   COMPLICATIONS: None apparent   CONDITION AT COMPLETION: Hemodynamically stable, awake   DISPOSITION: PACU   INDICATION(S) FOR PROCEDURE:  Patient is a 42 y.o. male who presented with advanced lymphoma cancer requiring durable central venous access for chemotherapy. All risks, benefits, and alternatives to above elective procedures were discussed with the patient, who elected to proceed, and informed consent was accordingly obtained at that time.  DETAILS OF PROCEDURE:  Patient was brought to the operative suite and appropriately identified. In Trendelenburg position, Right IJ venous access site was prepped and draped in the usual sterile fashion, and following a brief timeout, percutaneous right IJ venous access was obtained under ultrasound guidance using Seldinger technique, by which local anesthetic was injected over the Right IJ vein, and access needle was inserted under direct ultrasound visualization into the Right IJ vein, through which soft guidewire was advanced, over which access needle was withdrawn. Guidewire was secured,  attention was directed to injection of local anesthetic along the planned tunnel site, 2-3 cm transverse Right chest incision was made and confirmed to accommodate the subcutaneous port, and flushed catheter was tunneled retrograde from the port site over the Right chest to the Right IJ access site with the attached port well-secured to the catheter and within the subcutaneous pocket. Insertion sheath was advanced over the guidewire, which was withdrawn along with the insertion sheath dilator. The catheter was introduced through the sheath and left on the [place] junction under fluoro guidance and catheter cut to desire lenght. Catheter connected to port and fixed to the pocket on two side to avoid twisting. Port was confirmed to withdraw blood and flush easily, after which concentrated heparin was instilled into the port and catheter. Dermis at the subcutaneous pocket was re-approximated using buried interrupted 3-0 Vicryl suture, and 4-0 Monocryl suture was used to re-approximate skin at the insertion and subcutaneous port sites in running subcuticular fashion for the subcutaneous port and buried interrupted fashion for the insertion site. Skin was cleaned, dried, and sterile skin glue was applied. Patient was then safely transferred to PACU for a chest x-ray. Ultrasound images are available on paper chart and Fluoroscopy guidance images are available in Epic.

## 2023-04-11 NOTE — Transfer of Care (Signed)
Immediate Anesthesia Transfer of Care Note  Patient: Randy [name].  Procedure(s) Performed: INSERTION PORT-A-CATH (Chest)  Patient Location: PACU  Anesthesia Type:MAC  Level of Consciousness: awake and oriented  Airway & Oxygen Therapy: Patient Spontanous Breathing and Patient connected to face mask  Post-op Assessment: Report given to RN  Post vital signs: Reviewed and stable  Last Vitals:  Vitals Value Taken Time  BP 171/97 04/11/23 1030  Temp    Pulse 71 04/11/23 1031  Resp 15 04/11/23 1030  SpO2 99 % 04/11/23 1031  Vitals shown include unvalidated device data.  Last Pain:  Vitals:   04/11/23 0817  TempSrc: Temporal  PainSc: 0-No pain         Complications: No notable events documented.

## 2023-04-11 NOTE — Discharge Instructions (Addendum)
°  Diet: Resume home heart healthy regular diet.  ° °Activity: Increase activity as tolerated. Light activity and walking are encouraged. Do not drive or drink alcohol if taking narcotic pain medications. ° °Wound care: May shower with soapy water and pat dry (do not rub incisions), but no baths or submerging incision underwater until follow-up. (no swimming)  ° °Medications: Resume all home medications. For mild to moderate pain: acetaminophen (Tylenol) or ibuprofen (if no kidney disease). Combining Tylenol with alcohol can substantially increase your risk of causing liver disease.  ° °Call office ([phone]) at any time if any questions, worsening pain, fevers/chills, bleeding, drainage from incision site, or other concerns. °AMBULATORY SURGERY  °DISCHARGE INSTRUCTIONS ° ° °The drugs that you were given will stay in your system until tomorrow so for the next 24 hours you should not: ° °Drive an automobile °Make any legal decisions °Drink any alcoholic beverage ° ° °You may resume regular meals tomorrow.  Today it is better to start with liquids and gradually work up to solid foods. ° °You may eat anything you prefer, but it is better to start with liquids, then soup and crackers, and gradually work up to solid foods. ° ° °Please notify your doctor immediately if you have any unusual bleeding, trouble breathing, redness and pain at the surgery site, drainage, fever, or pain not relieved by medication. ° ° ° °Additional Instructions: ° ° ° ° ° ° ° °Please contact your physician with any problems or Same Day Surgery at [phone], Monday through Friday 6 am to 4 pm, or [place] [facility] [place] Main number at [phone].  °

## 2023-04-11 NOTE — H&P (Signed)
History of Present Illness Randy [name]. is a 42 y.o. male with lymphoma presents today for insertion of Port-A-Cath.  Patient was oriented about procedure.  Patient had hemodialysis yesterday.  Patient denies any chest pain or shortness of breath.  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    Migraines    Seizures        Past Surgical History:  Procedure Laterality Date   INGUINAL LYMPH NODE BIOPSY Left 03/12/2023   Procedure: INGUINAL LYMPH NODE BIOPSY excisional;  Surgeon: Carolan Shiver, MD;  Location: [facility] ORS;  Service: General;  Laterality: Left;   IR BONE MARROW BIOPSY & ASPIRATION  03/21/2023    No Known Allergies  Current Facility-Administered Medications  Medication Dose Route Frequency Provider Last Rate Last Admin   ceFAZolin (ANCEF) IVPB 2g/100 mL premix  2 g Intravenous On Call to OR Carolan Shiver, MD       lactated ringers infusion   Intravenous Continuous Yevette Edwards, MD 10 mL/hr at 04/11/23 0824 New Bag at 04/11/23 0824    Family History Family History  Problem Relation Age of Onset   Stroke Father    Diabetes Father        Social History Social History   Tobacco Use   Smoking status: Former    Years: 15    Types: Cigarettes    Quit date: 03/25/2022    Years since quitting: 1.0  Vaping Use   Vaping Use: Every day   Start date: 03/27/2023  Substance Use Topics   Alcohol use: Yes   Drug use: Yes    Types: Marijuana        ROS Full ROS of systems performed and is otherwise negative there than what is stated in the HPI  Physical Exam Blood pressure (!) 154/113, pulse 81, temperature (!) 97.5 F (36.4 C), temperature source Temporal, resp. rate 16, SpO2 98 %.  CONSTITUTIONAL: Alert, oriented x 3 EYES: Pupils equal, round, and reactive to light, Sclera non-icteric. EARS, NOSE, MOUTH AND THROAT: The oropharynx is clear. Oral mucosa  is pink and moist. Hearing is intact to voice.  NECK: Trachea is midline, and there is no jugular venous distension. Thyroid is without palpable abnormalities. LYMPH NODES:  Lymph nodes in the neck are not enlarged. RESPIRATORY:  Lungs are clear, and breath sounds are equal bilaterally. Normal respiratory effort without pathologic use of accessory muscles. CARDIOVASCULAR: Heart is regular without murmurs, gallops, or rubs. GI: The abdomen is  soft, nontender, and nondistended. There were no palpable masses. There was no hepatosplenomegaly. There were normal bowel sounds. MUSCULOSKELETAL:  Normal muscle strength and tone in all four extremities.    SKIN: Skin turgor is normal. There are no pathologic skin lesions.  NEUROLOGIC:  Motor and sensation is grossly normal.  Cranial nerves are grossly intact. PSYCH:  Alert and oriented to person, place and time. Affect is normal.  Assessment    Anaplastic ALK negative large cell lymphoma -Medical oncology recommended chemotherapy.  Patient was oriented about the procedure of insertion of Port-A-Cath.  Patient was oriented about the risks including bleeding, infection, pneumothorax, hemothorax, arteriovenous information, DVT, among others.  The patient reported he understood and agreed to proceed.  Plan    Insertion of Port-A-Cath    Carolan Shiver, MD  Carolan Shiver 04/11/2023, 8:59 AM

## 2023-04-11 NOTE — Anesthesia Preprocedure Evaluation (Signed)
Anesthesia Evaluation  Patient identified by MRN, date of birth, ID band Patient awake    Reviewed: Allergy & Precautions, NPO status , Patient's Chart, lab work & pertinent test results  History of Anesthesia Complications Negative for: history of anesthetic complications  Airway Mallampati: III  TM Distance: >3 FB Neck ROM: full    Dental  (+) Chipped, Partial Upper, Poor Dentition, Missing   Pulmonary neg shortness of breath, former smoker   Pulmonary exam normal        Cardiovascular (-) angina (-) Past MI negative cardio ROS Normal cardiovascular exam     Neuro/Psych  Headaches, Seizures -, Well Controlled,   negative psych ROS   GI/Hepatic negative GI ROS, Neg liver ROS,,,  Endo/Other  negative endocrine ROS    Renal/GU negative Renal ROS  negative genitourinary   Musculoskeletal   Abdominal   Peds  Hematology negative hematology ROS (+)   Anesthesia Other Findings Past Medical History: No date: 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 No date: Migraines No date: Seizures  Past Surgical History: 03/12/2023: INGUINAL LYMPH NODE BIOPSY; Left     Comment:  Procedure: INGUINAL LYMPH NODE BIOPSY excisional;                Surgeon: Carolan Shiver, MD;  Location: [facility] ORS;               Service: General;  Laterality: Left; 03/21/2023: IR BONE MARROW BIOPSY & ASPIRATION     Reproductive/Obstetrics negative OB ROS                             Anesthesia Physical Anesthesia Plan  ASA: 3  Anesthesia Plan: General   Post-op Pain Management:    Induction: Intravenous  PONV Risk Score and Plan: Propofol infusion and TIVA  Airway Management Planned: Natural Airway and Nasal Cannula  Additional Equipment:   Intra-op Plan:   Post-operative Plan:   Informed Consent: I have reviewed the patients History and Physical, chart, labs and  discussed the procedure including the risks, benefits and alternatives for the proposed anesthesia with the patient or authorized representative who has indicated his/her understanding and acceptance.     Dental Advisory Given  Plan Discussed with: Anesthesiologist, CRNA and Surgeon  Anesthesia Plan Comments: (Patient consented for risks of anesthesia including but not limited to:  - adverse reactions to medications - risk of airway placement if required - damage to eyes, teeth, lips or other oral mucosa - nerve damage due to positioning  - sore throat or hoarseness - Damage to heart, brain, nerves, lungs, other parts of body or loss of life  Patient voiced understanding.)       Anesthesia Quick Evaluation

## 2023-04-12 ENCOUNTER — Inpatient Hospital Stay: Payer: 59

## 2023-04-12 ENCOUNTER — Ambulatory Visit: Payer: 59

## 2023-04-12 ENCOUNTER — Inpatient Hospital Stay (HOSPITAL_BASED_OUTPATIENT_CLINIC_OR_DEPARTMENT_OTHER): Payer: 59 | Admitting: Oncology

## 2023-04-12 ENCOUNTER — Encounter: Payer: Self-pay | Admitting: Oncology

## 2023-04-12 VITALS — BP 127/83 | HR 70 | Resp 18

## 2023-04-12 VITALS — BP 137/106 | HR 73 | Temp 96.7°F | Resp 18 | Wt 251.2 lb

## 2023-04-12 DIAGNOSIS — Z79620 Long term (current) use of immunosuppressive biologic: Secondary | ICD-10-CM | POA: Diagnosis not present

## 2023-04-12 DIAGNOSIS — Z5111 Encounter for antineoplastic chemotherapy: Secondary | ICD-10-CM

## 2023-04-12 DIAGNOSIS — C8475 Anaplastic large cell lymphoma, ALK-negative, lymph nodes of inguinal region and lower limb: Secondary | ICD-10-CM

## 2023-04-12 DIAGNOSIS — Z7189 Other specified counseling: Secondary | ICD-10-CM | POA: Diagnosis not present

## 2023-04-12 DIAGNOSIS — C8470 Anaplastic large cell lymphoma, ALK-negative, unspecified site: Secondary | ICD-10-CM | POA: Diagnosis not present

## 2023-04-12 DIAGNOSIS — Z5112 Encounter for antineoplastic immunotherapy: Secondary | ICD-10-CM | POA: Diagnosis not present

## 2023-04-12 LAB — CMP (CANCER CENTER ONLY)
ALT: 20 U/L (ref 0–44)
AST: 25 U/L (ref 15–41)
Albumin: 4.1 g/dL (ref 3.5–5.0)
Alkaline Phosphatase: 102 U/L (ref 38–126)
Anion gap: 9 (ref 5–15)
BUN: 13 mg/dL (ref 6–20)
CO2: 25 mmol/L (ref 22–32)
Calcium: 9 mg/dL (ref 8.9–10.3)
Chloride: 104 mmol/L (ref 98–111)
Creatinine: 1.26 mg/dL — ABNORMAL HIGH (ref 0.61–1.24)
GFR, Estimated: >60 mL/min (ref >60)
Glucose, Bld: 122 mg/dL — ABNORMAL HIGH (ref 70–99)
Potassium: 3.5 mmol/L (ref 3.5–5.1)
Sodium: 138 mmol/L (ref 135–145)
Total Bilirubin: 0.5 mg/dL (ref 0.3–1.2)
Total Protein: 7.3 g/dL (ref 6.5–8.1)

## 2023-04-12 LAB — CBC WITH DIFFERENTIAL (CANCER CENTER ONLY)
Abs Immature Granulocytes: 0.01 10*3/uL (ref 0.00–0.07)
Basophils Absolute: 0.1 10*3/uL (ref 0.0–0.1)
Basophils Relative: 1 %
Eosinophils Absolute: 0.2 10*3/uL (ref 0.0–0.5)
Eosinophils Relative: 3 %
HCT: 44.1 % (ref 39.0–52.0)
Hemoglobin: 14.6 g/dL (ref 13.0–17.0)
Immature Granulocytes: 0 %
Lymphocytes Relative: 28 %
Lymphs Abs: 1.8 10*3/uL (ref 0.7–4.0)
MCH: 28.3 pg (ref 26.0–34.0)
MCHC: 33.1 g/dL (ref 30.0–36.0)
MCV: 85.6 fL (ref 80.0–100.0)
Monocytes Absolute: 0.4 10*3/uL (ref 0.1–1.0)
Monocytes Relative: 6 %
Neutro Abs: 3.8 10*3/uL (ref 1.7–7.7)
Neutrophils Relative %: 62 %
Platelet Count: 312 10*3/uL (ref 150–400)
RBC: 5.15 MIL/uL (ref 4.22–5.81)
RDW: 14.1 % (ref 11.5–15.5)
WBC Count: 6.3 10*3/uL (ref 4.0–10.5)
nRBC: 0 % (ref 0.0–0.2)

## 2023-04-12 MED ORDER — PALONOSETRON HCL INJECTION 0.25 MG/5ML
0.2500 mg | Freq: Once | INTRAVENOUS | Status: AC
  Administered 2023-04-12: 0.25 mg via INTRAVENOUS
  Filled 2023-04-12: qty 5

## 2023-04-12 MED ORDER — SODIUM CHLORIDE 0.9 % IV SOLN
10.0000 mg | Freq: Once | INTRAVENOUS | Status: AC
  Administered 2023-04-12: 10 mg via INTRAVENOUS
  Filled 2023-04-12: qty 10

## 2023-04-12 MED ORDER — ACETAMINOPHEN 325 MG PO TABS
650.0000 mg | ORAL_TABLET | Freq: Once | ORAL | Status: AC
  Administered 2023-04-12: 650 mg via ORAL
  Filled 2023-04-12: qty 2

## 2023-04-12 MED ORDER — CHLORHEXIDINE GLUCONATE 0.12 % MT SOLN: 15.0000 mL | Freq: Two times a day (BID) | OROMUCOSAL | 3 refills | Status: DC

## 2023-04-12 MED ORDER — SODIUM CHLORIDE 0.9 % IV SOLN
150.0000 mg | Freq: Once | INTRAVENOUS | Status: AC
  Administered 2023-04-12: 150 mg via INTRAVENOUS
  Filled 2023-04-12: qty 150

## 2023-04-12 MED ORDER — DIPHENHYDRAMINE HCL 50 MG/ML IJ SOLN
50.0000 mg | Freq: Once | INTRAMUSCULAR | Status: AC
  Administered 2023-04-12: 50 mg via INTRAVENOUS
  Filled 2023-04-12: qty 1

## 2023-04-12 MED ORDER — DOXORUBICIN HCL CHEMO IV INJECTION 2 MG/ML
50.0000 mg/m2 | Freq: Once | INTRAVENOUS | Status: AC
  Administered 2023-04-12: 120 mg via INTRAVENOUS
  Filled 2023-04-12: qty 60

## 2023-04-12 MED ORDER — HEPARIN SOD (PORK) LOCK FLUSH 100 UNIT/ML IV SOLN
500.0000 [IU] | Freq: Once | INTRAVENOUS | Status: AC | PRN
  Administered 2023-04-12: 500 [IU]
  Filled 2023-04-12: qty 5

## 2023-04-12 MED ORDER — SODIUM CHLORIDE 0.9 % IV SOLN
180.0000 mg | Freq: Once | INTRAVENOUS | Status: AC
  Administered 2023-04-12: 180 mg via INTRAVENOUS
  Filled 2023-04-12: qty 36

## 2023-04-12 MED ORDER — SODIUM CHLORIDE 0.9 % IV SOLN
750.0000 mg/m2 | Freq: Once | INTRAVENOUS | Status: AC
  Administered 2023-04-12: 2000 mg via INTRAVENOUS
  Filled 2023-04-12: qty 100

## 2023-04-12 MED ORDER — SODIUM CHLORIDE 0.9 % IV SOLN
Freq: Once | INTRAVENOUS | Status: AC
  Filled 2023-04-12: qty 250

## 2023-04-12 NOTE — Patient Instructions (Signed)
[place] [facility]  Discharge Instructions: Thank you for choosing [facility] to provide your oncology and hematology care.  If you have a lab appointment with the Cancer Center, please go directly to the Cancer Center and check in at the registration area.  Wear comfortable clothing and clothing appropriate for easy access to any Portacath or PICC line.   We strive to give you quality time with your provider. You may need to reschedule your appointment if you arrive late (15 or more minutes).  Arriving late affects you and other patients whose appointments are after yours.  Also, if you miss three or more appointments without notifying the office, you may be dismissed from the clinic at the provider's discretion.      For prescription refill requests, have your pharmacy contact our office and allow 72 hours for refills to be completed.    Today you received the following chemotherapy and/or immunotherapy agents Doxorubicin, Cyclophosphamide, and Brentuximab.      To help prevent nausea and vomiting after your treatment, we encourage you to take your nausea medication as directed.  BELOW ARE SYMPTOMS THAT SHOULD BE REPORTED IMMEDIATELY: *FEVER GREATER THAN 100.4 F (38 C) OR HIGHER *CHILLS OR SWEATING *NAUSEA AND VOMITING THAT IS NOT CONTROLLED WITH YOUR NAUSEA MEDICATION *UNUSUAL SHORTNESS OF BREATH *UNUSUAL BRUISING OR BLEEDING *URINARY PROBLEMS (pain or burning when urinating, or frequent urination) *BOWEL PROBLEMS (unusual diarrhea, constipation, pain near the anus) TENDERNESS IN MOUTH AND THROAT WITH OR WITHOUT PRESENCE OF ULCERS (sore throat, sores in mouth, or a toothache) UNUSUAL RASH, SWELLING OR PAIN  UNUSUAL VAGINAL DISCHARGE OR ITCHING   Items with * indicate a potential emergency and should be followed up as soon as possible or go to the Emergency Department if any problems should occur.  Please show the CHEMOTHERAPY ALERT CARD or  IMMUNOTHERAPY ALERT CARD at check-in to the Emergency Department and triage nurse.  Should you have questions after your visit or need to cancel or reschedule your appointment, please contact [place] [facility]  [phone] and follow the prompts.  Office hours are 8:00 a.m. to 4:30 p.m. Monday - Friday. Please note that voicemails left after 4:00 p.m. may not be returned until the following business day.  We are closed weekends and major holidays. You have access to a nurse at all times for urgent questions. Please call the main number to the clinic [phone] and follow the prompts.  For any non-urgent questions, you may also contact your provider using MyChart. We now offer e-Visits for anyone 32 and older to request care online for non-urgent symptoms. For details visit [email or web address].   Also download the MyChart app! Go to the app store, search "MyChart", open the app, select , and log in with your MyChart username and password.

## 2023-04-12 NOTE — Assessment & Plan Note (Addendum)
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  Recommend BV + CHP x 3 cycles, restage with PET Rationale and side effects were reviewed with patient and he agrees with the plan.  Baseline ECHO reviewed.- normal LVEF We reviewed antiemetics and prednisone instructions.  Labs are reviewed and discussed with patient. Proceed with BV-CHP with D2 GCSF, claritin  daily x 4 days   History of genitle herpes, recommend acyclovir prophylaxis.  BID

## 2023-04-12 NOTE — Progress Notes (Signed)
Hematology/Oncology Progress note [phone] [phone]      CHIEF COMPLAINTS/PURPOSE OF CONSULTATION:  ALK negative, anaplastic large cell lymphoma.   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  Recommend BV + CHP x 3 cycles, restage with PET Rationale and side effects were reviewed with patient and he agrees with the plan.  Baseline ECHO reviewed.- normal LVEF We reviewed antiemetics and prednisone instructions.  Labs are reviewed and discussed with patient. Proceed with BV-CHP with D2 GCSF, claritin  daily x 4 days   History of genitle herpes, recommend acyclovir prophylaxis.  BID   Goals of care, counseling/discussion Discussed with patient.   Encounter for antineoplastic chemotherapy Chemotherapy plan as listed above   Orders Placed This Encounter  Procedures   CBC with Differential (Cancer Center Only)    Standing Status:   Future    Standing Expiration Date:   05/02/2024   CMP (Cancer Center only)    Standing Status:   Future    Standing Expiration Date:   05/02/2024   CBC with Differential (Cancer Center Only)    Standing Status:   Future    Standing Expiration Date:   05/23/2024   CMP (Cancer Center only)    Standing Status:   Future    Standing Expiration Date:   05/23/2024   CBC with Differential (Cancer Center Only)    Standing Status:   Future    Standing Expiration Date:   06/13/2024   CMP (Cancer Center only)    Standing Status:   Future    Standing Expiration Date:   06/13/2024   CBC with  Differential (Cancer Center Only)    Standing Status:   Future    Standing Expiration Date:   04/11/2024   CMP (Cancer Center only)    Standing Status:   Future    Standing Expiration Date:   04/11/2024   Uric acid    Standing Status:   Future    Standing Expiration Date:   04/11/2024   Lactate dehydrogenase    Standing Status:   Future    Standing Expiration Date:   04/11/2024   Phosphorus    Standing Status:   Future    Standing Expiration Date:   04/11/2024   Follow up 1 week lab MD IVF 3 weeks lab MD BV-CHP D2 GSCF All questions were answered. The patient knows to call the clinic with any problems, questions or concerns.  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 02/09/2023 CT abdomen pelvis with contrast showed massive lymphadenopathy of left inguinal groin, largest measures 6.1 x 5.5 x 4.4 cm  Smalt fat containing left inguinal hernia.  5 mm left solid pulmonary nodule.  Urine was negative for chlamydia and gonorrhea  patient was given empiric antibiotics and referred to establish care with oncology for further evaluation.   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  Excisional biopsy was recommended   03/12/2023, left inguinal excisional lymph node biopsy showed CD30 positive T-cell lymphoma, most consistent with ALK negative anaplastic large cell lymphoma.  03/21/2023, bone marrow biopsy showed normocellular bone marrow for age with trilineage hematopoiesis.  No  significant CD34 positive blastic population identified.  No monoclonal B-cell population or significant T-cell abnormalities identified     03/14/2023 Imaging   PET scan showed  1. Hypermetabolic left inguinal and left external iliac lymph nodes consistent with lymphomatous disease involvement. 2. Diffuse low-level FDG avidity throughout the axial and appendicular skeleton is nonspecific likely physiologic less likely reflecting lymphomatous involvement. 3. No splenomegaly. 4. Symmetric hypermetabolic hyperplasia of the tonsils is commonly reactive. 5. Tiny fat containing left inguinal hernia. 6. Aortic Atherosclerosis    03/22/2023 Cancer Staging   Staging form: Hodgkin and Non-Hodgkin Lymphoma, AJCC 8th Edition - Clinical stage from 03/22/2023: Stage II (Peripheral T-cell lymphoma) - Signed by Rickard Patience, MD on 04/05/2023 Stage prefix: Initial diagnosis   04/03/2023 Echocardiogram   LVEF 60 to 65%    04/12/2023 -  Chemotherapy   Patient is on Treatment Plan : LYMPHOMA  A + CHP (Brentuximab + Cyclophosphamide + Doxorubicin + Prednisone) q21 Days x 6 Cycles     04/12/2023 Procedure   Medi port placed by Dr. Maia Plan    Reviewed previous medical history. 01/08/2014-01/08/2021, patient presented emergency room for headache,.  LP was done which showed lymphocytic predominant pleocytosis, HSV 2+, RPR reactive 1:4, HIV and crypto Ag negative.  He was treated with HSV meningitis with acyclovir.  Patient also was treated for skin eruptions secondary syphilis with penicillin injections.   INTERVAL HISTORY Randy [name]. is a 42 y.o. male who has above history reviewed by me today presents for follow up visit for Anaplastic ALK-negative large cell lymphoma  He has had chemotherapy class. Echo baseline showed LVEF wnl.  No new complaints.   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    Migraines     Seizures     SURGICAL HISTORY: Past Surgical History:  Procedure Laterality Date   INGUINAL LYMPH NODE BIOPSY Left 03/12/2023   Procedure: INGUINAL LYMPH NODE BIOPSY excisional;  Surgeon: Carolan Shiver, MD;  Location: [facility] ORS;  Service: General;  Laterality: Left;   IR BONE MARROW BIOPSY & ASPIRATION  03/21/2023    SOCIAL HISTORY: Social History   Socioeconomic History   Marital status: Single    Spouse name: Not on file   Number of children: Not on file   Years of education: Not on file   Highest education level: Not on file  Occupational History   Not on file  Tobacco Use   Smoking status: Former    Years: 15    Types: Cigarettes    Quit date: 03/25/2022    Years since quitting: 1.0   Smokeless tobacco: Not on file  Vaping Use   Vaping Use: Every day   Start date: 03/27/2023  Substance and Sexual Activity   Alcohol use: Yes   Drug use: Yes    Types: Marijuana   Sexual activity: Not on file  Other Topics Concern   Not on file  Social History Narrative   Married and live at home with wife and children. He is a Curator.    Social Determinants of Health   Financial Resource Strain: Low Risk  (02/13/2023)   Overall Financial Resource Strain (CARDIA)    Difficulty of Paying Living Expenses: Not hard at all  Food Insecurity: No Food Insecurity (02/13/2023)   Hunger Vital Sign    Worried About Running Out of Food in the Last Year: Never true    Ran Out of Food in the Last Year: Never true  Transportation Needs: No Transportation Needs (02/13/2023)   PRAPARE - Administrator, Civil Service (Medical): No    Lack of Transportation (Non-Medical): No  Physical Activity: Not on file  Stress: No Stress Concern Present (02/13/2023)   [employer] of Occupational Health - Occupational Stress Questionnaire    Feeling of Stress : Not at all  Social Connections: Not on file  Intimate Partner Violence: Not At Risk (02/13/2023)   Humiliation, Afraid, Rape, and  Kick questionnaire    Fear of Current or Ex-Partner: No    Emotionally Abused: No    Physically Abused: No    Sexually Abused: No    FAMILY HISTORY: Family History  Problem Relation Age of Onset   Stroke Father    Diabetes Father     ALLERGIES:  has No Known Allergies.  MEDICATIONS:  Current Outpatient Medications  Medication Sig Dispense Refill   acyclovir (ZOVIRAX) 400 MG tablet Take 1 tablet (400 mg total) by mouth 2 (two) times daily. 60 tablet 1   chlorhexidine (PERIDEX) 0.12 % solution Use as directed 15 mLs in the mouth or throat 2 (two) times daily. 473 mL 3   lidocaine-prilocaine (EMLA) cream Apply to affected area once 30 g 3   ondansetron (ZOFRAN) 8 MG tablet Take 1 tablet (8 mg total) by mouth every 8 (eight) hours as needed for nausea or vomiting. Start on the third day after cyclophosphamide chemotherapy. 30 tablet 1   predniSONE (DELTASONE) 20 MG tablet Take 5 tablets (100 mg total) by mouth daily. Take on days 2-5 of chemotherapy. 20 tablet 7   prochlorperazine (COMPAZINE) 10 MG tablet Take 1 tablet (10 mg total) by mouth every 6 (six) hours as needed for nausea or vomiting. 30 tablet 6   traMADol (ULTRAM) 50 MG tablet Take 1 tablet (50 mg total) by mouth every 6 (six) hours as needed. (Patient taking differently: Take 50 mg by mouth every 6 (six) hours as needed for moderate pain.) 10 tablet 0   No current facility-administered medications for this visit.   Facility-Administered Medications Ordered in Other Visits  Medication Dose Route Frequency Provider Last Rate Last Admin   brentuximab vedotin (ADCETRIS) 180 mg in sodium chloride 0.9 % 100 mL chemo infusion  180 mg Intravenous Once Rickard Patience, MD       cyclophosphamide (CYTOXAN) 2,000 mg in sodium chloride 0.9 % 500 mL chemo infusion  750 mg/m2 (Treatment Plan Recorded) Intravenous Once Rickard Patience, MD       dexamethasone (DECADRON) 10 mg in sodium chloride 0.9 % 50 mL IVPB  10 mg Intravenous Once Rickard Patience, MD        DOXOrubicin (ADRIAMYCIN) chemo injection 120 mg  50 mg/m2 (Treatment Plan Recorded) Intravenous Once Rickard Patience, MD       fosaprepitant (EMEND) 150 mg in sodium chloride 0.9 % 145 mL IVPB  150 mg Intravenous Once Rickard Patience, MD 450 mL/hr at 04/12/23 1017 150  mg at 04/12/23 1017    Review of Systems  Constitutional:  Negative for appetite change, chills, fatigue, fever and unexpected weight change.  HENT:   Negative for hearing loss and voice change.   Eyes:  Negative for eye problems and icterus.  Respiratory:  Negative for chest tightness, cough and shortness of breath.   Cardiovascular:  Negative for chest pain and leg swelling.  Gastrointestinal:  Negative for abdominal distention and abdominal pain.  Endocrine: Negative for hot flashes.  Genitourinary:  Negative for difficulty urinating, dysuria and frequency.        Left inguinal mass  Musculoskeletal:  Negative for arthralgias.  Skin:  Negative for itching and rash.  Neurological:  Negative for light-headedness and numbness.  Hematological:  Negative for adenopathy. Does not bruise/bleed easily.  Psychiatric/Behavioral:  Negative for confusion.      PHYSICAL EXAMINATION: ECOG PERFORMANCE STATUS: 0 - Asymptomatic  Vitals:   04/12/23 0916  BP: (!) 137/106  Pulse: 73  Resp: 18  Temp: (!) 96.7 F (35.9 C)  SpO2: 98%   Filed Weights   04/12/23 0916  Weight: 251 lb 3.2 oz (113.9 kg)    Physical Exam Constitutional:      General: He is not in acute distress. HENT:     Head: Normocephalic and atraumatic.  Eyes:     General: No scleral icterus. Cardiovascular:     Rate and Rhythm: Normal rate and regular rhythm.     Heart sounds: No murmur heard. Pulmonary:     Effort: Pulmonary effort is normal. No respiratory distress.  Abdominal:     General: There is no distension.     Palpations: Abdomen is soft.  Genitourinary:    Comments: Left inguinal massive lymphadenopathy, fixed  Musculoskeletal:        General: Normal  range of motion.     Cervical back: Normal range of motion and neck supple.  Skin:    General: Skin is warm and dry.     Findings: No erythema.  Neurological:     Mental Status: He is alert and oriented to person, place, and time. Mental status is at baseline.     Cranial Nerves: No cranial nerve deficit.     Motor: No abnormal muscle tone.  Psychiatric:        Mood and Affect: Mood and affect normal.    RN Merleen Nicely served as chaperone during GU examination LABORATORY DATA:  I have reviewed the data as listed    Latest Ref Rng & Units 04/12/2023    9:08 AM 03/21/2023    8:13 AM 02/13/2023   12:00 PM  CBC  WBC 4.0 - 10.5 K/uL 6.3  9.3  6.8   Hemoglobin 13.0 - 17.0 g/dL 16.1  09.6  04.5   Hematocrit 39.0 - 52.0 % 44.1  47.6  45.7   Platelets 150 - 400 K/uL 312  278  304       Latest Ref Rng & Units 04/12/2023    9:08 AM 02/13/2023   12:00 PM 02/09/2023    6:55 PM  CMP  Glucose 70 - 99 mg/dL 409  96  811   BUN 6 - 20 mg/dL 13  11  10    Creatinine 0.61 - 1.24 mg/dL 9.14  7.82  9.56   Sodium 135 - 145 mmol/L 138  139  136   Potassium 3.5 - 5.1 mmol/L 3.5  4.0  3.8   Chloride 98 - 111 mmol/L 104  104  100  CO2 22 - 32 mmol/L 25  25  25    Calcium 8.9 - 10.3 mg/dL 9.0  9.4  9.4   Total Protein 6.5 - 8.1 g/dL 7.3  8.4  7.8   Total Bilirubin 0.3 - 1.2 mg/dL 0.5  0.7  1.1   Alkaline Phos 38 - 126 U/L 102  110  95   AST 15 - 41 U/L 25  34  34   ALT 0 - 44 U/L 20  39  31      RADIOGRAPHIC STUDIES: I have personally reviewed the radiological images as listed and agreed with the findings in the report. DG Chest Port 1 View  Result Date: 04/11/2023 CLINICAL DATA:  Port-A-Cath placement. EXAM: PORTABLE CHEST 1 VIEW COMPARISON:  Chest radiographs 04/20/2017.  PET-03/14/2023. FINDINGS: The cardiomediastinal silhouette is accentuated by portable AP technique and low lung volumes. A right jugular Port-A-Cath has been placed and terminates near the superior cavoatrial junction.  External artifact is noted over the right lung base and lower mediastinum. Within this limitation, no definite airspace consolidation, overt pulmonary edema, sizable pleural effusion, or pneumothorax is identified. No acute osseous abnormality is seen. IMPRESSION: Right jugular Port-A-Cath as above. Low lung volumes. Electronically Signed   By: Sebastian Ache M.D.   On: 04/11/2023 11:03   DG C-Arm 1-60 Min-No Report  Result Date: 04/11/2023 Fluoroscopy was utilized by the requesting physician.  No radiographic interpretation.   ECHOCARDIOGRAM COMPLETE  Result Date: 04/03/2023    ECHOCARDIOGRAM REPORT   Patient Name:   Randy [name]. Date of Exam: 04/03/2023 Medical Rec #:  [id number]          Height:       73.0 in Accession #:    [id number]         Weight:       249.3 lb Date of Birth:  1981-02-18         BSA:          2.363 m Patient Age:    41 years           BP:           143/106 mmHg Patient Gender: M                  HR:           72 bpm. Exam Location:  [facility] Procedure: 2D Echo, Cardiac Doppler and Color Doppler Indications:     Chemo  History:         Patient has no prior history of Echocardiogram examinations.                  Lymphoma.  Sonographer:     Mikki Harbor Referring Phys:  [id number] Sumiya Mamaril Diagnosing Phys: Yvonne Kendall MD  Sonographer Comments: Global longitudinal strain was attempted. IMPRESSIONS  1. Left ventricular ejection fraction, by estimation, is 60 to 65%. The left ventricle has normal function. The left ventricle has no regional wall motion abnormalities. Left ventricular diastolic parameters were normal. The average left ventricular global longitudinal strain is -21.8 %. The global longitudinal strain is normal.  2. Right ventricular systolic function is normal. The right ventricular size is normal.  3. The mitral valve is normal in structure. Trivial mitral valve regurgitation.  4. The aortic valve is tricuspid. Aortic valve regurgitation is not visualized. No aortic  stenosis is present.  5. The inferior vena cava is dilated in size with >50% respiratory variability, suggesting right atrial pressure of  8 mmHg. FINDINGS  Left Ventricle: Left ventricular ejection fraction, by estimation, is 60 to 65%. The left ventricle has normal function. The left ventricle has no regional wall motion abnormalities. The average left ventricular global longitudinal strain is -21.8 %. The global longitudinal strain is normal. The left ventricular internal cavity size was normal in size. There is borderline left ventricular hypertrophy. Left ventricular diastolic parameters were normal. Right Ventricle: The right ventricular size is normal. No increase in right ventricular wall thickness. Right ventricular systolic function is normal. Left Atrium: Left atrial size was normal in size. Right Atrium: Right atrial size was normal in size. Pericardium: There is no evidence of pericardial effusion. Mitral Valve: The mitral valve is normal in structure. Trivial mitral valve regurgitation. MV peak gradient, 3.5 mmHg. The mean mitral valve gradient is 2.0 mmHg. Tricuspid Valve: The tricuspid valve is normal in structure. Tricuspid valve regurgitation is trivial. Aortic Valve: The aortic valve is tricuspid. Aortic valve regurgitation is not visualized. No aortic stenosis is present. Aortic valve mean gradient measures 4.0 mmHg. Aortic valve peak gradient measures 7.0 mmHg. Aortic valve area, by VTI measures 2.67 cm. Pulmonic Valve: The pulmonic valve was normal in structure. Pulmonic valve regurgitation is trivial. No evidence of pulmonic stenosis. Aorta: The aortic root is normal in size and structure. Pulmonary Artery: The pulmonary artery is of normal size. Venous: The inferior vena cava is dilated in size with greater than 50% respiratory variability, suggesting right atrial pressure of 8 mmHg. IAS/Shunts: No atrial level shunt detected by color flow Doppler.  LEFT VENTRICLE PLAX 2D LVIDd:         4.80  cm   Diastology LVIDs:         3.20 cm   LV e' medial:    9.46 cm/s LV PW:         1.19 cm   LV E/e' medial:  8.4 LV IVS:        1.10 cm   LV e' lateral:   11.70 cm/s LVOT diam:     2.20 cm   LV E/e' lateral: 6.8 LV SV:         84 LV SV Index:   36        2D Longitudinal Strain LVOT Area:     3.80 cm  2D Strain GLS Avg:     -21.8 %  RIGHT VENTRICLE RV Basal diam:  3.70 cm RV Mid diam:    3.80 cm RV S prime:     13.40 cm/s TAPSE (M-mode): 2.5 cm LEFT ATRIUM             Index        RIGHT ATRIUM           Index LA diam:        3.90 cm 1.65 cm/m   RA Area:     15.60 cm LA Vol (A2C):   65.9 ml 27.89 ml/m  RA Volume:   34.40 ml  14.56 ml/m LA Vol (A4C):   44.1 ml 18.66 ml/m LA Biplane Vol: 57.8 ml 24.46 ml/m  AORTIC VALVE                    PULMONIC VALVE AV Area (Vmax):    3.28 cm     PV Vmax:       0.93 m/s AV Area (Vmean):   2.95 cm     PV Peak grad:  3.4 mmHg AV Area (VTI):     2.67  cm AV Vmax:           132.00 cm/s AV Vmean:          92.300 cm/s AV VTI:            0.315 m AV Peak Grad:      7.0 mmHg AV Mean Grad:      4.0 mmHg LVOT Vmax:         114.00 cm/s LVOT Vmean:        71.600 cm/s LVOT VTI:          0.221 m LVOT/AV VTI ratio: 0.70  AORTA Ao Root diam: 3.40 cm MITRAL VALVE MV Area (PHT): 3.81 cm    SHUNTS MV Area VTI:   2.79 cm    Systemic VTI:  0.22 m MV Peak grad:  3.5 mmHg    Systemic Diam: 2.20 cm MV Mean grad:  2.0 mmHg MV Vmax:       0.93 m/s MV Vmean:      60.0 cm/s MV Decel Time: 199 msec MV E velocity: 79.40 cm/s MV A velocity: 61.40 cm/s MV E/A ratio:  1.29 Cristal Deer End MD Electronically signed by Yvonne Kendall MD Signature Date/Time: 04/03/2023/5:58:15 PM    Final    IR BONE MARROW BIOPSY & ASPIRATION  Result Date: 03/21/2023 INDICATION: lymphadenopathy EXAM: IR BONE MARROW BIOPSY AND ASPIRATION MEDICATIONS: None. ANESTHESIA/SEDATION: Moderate (conscious) sedation was employed during this procedure. A total of Versed 2 mg and Fentanyl 100 mcg was administered intravenously.  Moderate Sedation Time: 10 minutes. The patient's level of consciousness and vital signs were monitored continuously by radiology nursing throughout the procedure under my direct supervision. FLUOROSCOPY TIME:  Fluoroscopy Time: 0.6 minutes (10 mGy) COMPLICATIONS: None immediate. PROCEDURE: Informed written consent was obtained from the patient after a thorough discussion of the procedural risks, benefits and alternatives. All questions were addressed. Maximal Sterile Barrier Technique was utilized including caps, mask, sterile gowns, sterile gloves, sterile drape, hand hygiene and skin antiseptic. A timeout was performed prior to the initiation of the procedure. The patient was placed prone on the IR exam table. Limited fluoroscopy of the pelvis was performed for planning purposes. Skin entry site was marked, and the overlying skin was prepped and draped in the standard sterile fashion. Local analgesia was obtained with 1% lidocaine. Using fluoroscopic guidance, an 11 gauge needle was advanced just deep to the cortex of the right posterior ilium. Subsequently, bone marrow aspiration and core biopsy were performed. Dry tap was noted. An additional core was obtained. Specimens were submitted to lab/pathology for handling. Hemostasis was achieved with manual pressure, and a clean dressing was placed. The patient tolerated the procedure well without immediate complication. IMPRESSION: Successful bone marrow core biopsy using fluoroscopic guidance. Attempts at bone marrow aspiration yielded a dry tap. Electronically Signed   By: Olive Bass M.D.   On: 03/21/2023 11:23   NM PET Image Initial (PI) Skull Base To Thigh  Result Date: 03/15/2023 CLINICAL DATA:  Initial treatment strategy for lymphoma. EXAM: NUCLEAR MEDICINE PET SKULL BASE TO THIGH TECHNIQUE: 13.14 mCi F-18 FDG was injected intravenously. Full-ring PET imaging was performed from the skull base to thigh after the radiotracer. CT data was obtained and  used for attenuation correction and anatomic localization. Fasting blood glucose: 101 mg/dl COMPARISON:  CT abdomen pelvis February 09, 2023 FINDINGS: Mediastinal blood pool activity: SUV max 1.9 Liver activity: SUV max 2.5 NECK: Symmetric hypermetabolic hyperplasia of the tonsils with a max SUV of 5.8 is commonly  reactive. Hypermetabolic sublingual activity likely reflects phonation. No hypermetabolic cervical adenopathy. Incidental CT findings: None. CHEST: No hypermetabolic thoracic adenopathy. No hypermetabolic pulmonary nodules or masses. Incidental CT findings: None. ABDOMEN/PELVIS: Hypermetabolic left inguinal and left external iliac lymph nodes. For reference: -hypermetabolic left inguinal lymph node measures 2.8 x 2.7 cm on image 157/4 with a max SUV of 17.7 -hypermetabolic left external iliac lymph node 4 mm in short axis on image 147/4 with a max SUV of 3.4. No hypermetabolic abdominal adenopathy. No abnormal hypermetabolic activity within the liver, pancreas, adrenal glands, or spleen. Incidental CT findings: No splenomegaly. Aortic atherosclerosis. Tiny fat containing left inguinal hernia. SKELETON: Diffuse low-level FDG avidity instance max SUV in the L5 vertebral body of 2.9. Incidental CT findings: None. IMPRESSION: 1. Hypermetabolic left inguinal and left external iliac lymph nodes consistent with lymphomatous disease involvement. 2. Diffuse low-level FDG avidity throughout the axial and appendicular skeleton is nonspecific likely physiologic less likely reflecting lymphomatous involvement. 3. No splenomegaly. 4. Symmetric hypermetabolic hyperplasia of the tonsils is commonly reactive. 5. Tiny fat containing left inguinal hernia. 6. Aortic Atherosclerosis (ICD10-I70.0). Electronically Signed   By: Maudry Mayhew M.D.   On: 03/15/2023 14:15

## 2023-04-12 NOTE — Assessment & Plan Note (Signed)
Chemotherapy plan as listed above 

## 2023-04-12 NOTE — Assessment & Plan Note (Signed)
Discussed with patient

## 2023-04-13 ENCOUNTER — Telehealth: Payer: Self-pay

## 2023-04-13 ENCOUNTER — Inpatient Hospital Stay: Payer: 59

## 2023-04-13 ENCOUNTER — Other Ambulatory Visit: Payer: Self-pay

## 2023-04-13 DIAGNOSIS — Z5111 Encounter for antineoplastic chemotherapy: Secondary | ICD-10-CM | POA: Diagnosis not present

## 2023-04-13 DIAGNOSIS — C8470 Anaplastic large cell lymphoma, ALK-negative, unspecified site: Secondary | ICD-10-CM | POA: Diagnosis not present

## 2023-04-13 DIAGNOSIS — C8475 Anaplastic large cell lymphoma, ALK-negative, lymph nodes of inguinal region and lower limb: Secondary | ICD-10-CM

## 2023-04-13 DIAGNOSIS — Z79620 Long term (current) use of immunosuppressive biologic: Secondary | ICD-10-CM | POA: Diagnosis not present

## 2023-04-13 DIAGNOSIS — Z5112 Encounter for antineoplastic immunotherapy: Secondary | ICD-10-CM | POA: Diagnosis not present

## 2023-04-13 MED ORDER — PEGFILGRASTIM-JMDB 6 MG/0.6ML ~~LOC~~ SOSY
6.0000 mg | PREFILLED_SYRINGE | Freq: Once | SUBCUTANEOUS | Status: AC
  Administered 2023-04-13: 6 mg via SUBCUTANEOUS
  Filled 2023-04-13: qty 0.6

## 2023-04-13 NOTE — Telephone Encounter (Signed)
Telephone call to patient for follow up after receiving first infusion.   Patient states infusion went great but had a "rough" night.   C/O nausea and took anti nausea pills with relief.  States not eating good but is drinking plenty of fluids. Pt coming today to receive Neulasta shot and did take his Claritin.       Encouraged patient to call for any concerns or questions.

## 2023-04-17 ENCOUNTER — Other Ambulatory Visit: Payer: Self-pay

## 2023-04-19 ENCOUNTER — Inpatient Hospital Stay: Payer: 59

## 2023-04-19 ENCOUNTER — Encounter: Payer: Self-pay | Admitting: Oncology

## 2023-04-19 ENCOUNTER — Inpatient Hospital Stay (HOSPITAL_BASED_OUTPATIENT_CLINIC_OR_DEPARTMENT_OTHER): Payer: 59 | Admitting: Oncology

## 2023-04-19 VITALS — BP 162/111 | HR 81 | Temp 98.3°F | Resp 18 | Wt 253.0 lb

## 2023-04-19 DIAGNOSIS — R110 Nausea: Secondary | ICD-10-CM | POA: Diagnosis not present

## 2023-04-19 DIAGNOSIS — Z79620 Long term (current) use of immunosuppressive biologic: Secondary | ICD-10-CM | POA: Diagnosis not present

## 2023-04-19 DIAGNOSIS — C8470 Anaplastic large cell lymphoma, ALK-negative, unspecified site: Secondary | ICD-10-CM | POA: Diagnosis not present

## 2023-04-19 DIAGNOSIS — C8475 Anaplastic large cell lymphoma, ALK-negative, lymph nodes of inguinal region and lower limb: Secondary | ICD-10-CM | POA: Diagnosis not present

## 2023-04-19 DIAGNOSIS — T451X5A Adverse effect of antineoplastic and immunosuppressive drugs, initial encounter: Secondary | ICD-10-CM | POA: Diagnosis not present

## 2023-04-19 DIAGNOSIS — Z5111 Encounter for antineoplastic chemotherapy: Secondary | ICD-10-CM | POA: Diagnosis not present

## 2023-04-19 DIAGNOSIS — Z5112 Encounter for antineoplastic immunotherapy: Secondary | ICD-10-CM | POA: Diagnosis not present

## 2023-04-19 LAB — CBC WITH DIFFERENTIAL (CANCER CENTER ONLY)
Abs Immature Granulocytes: 0.1 10*3/uL — ABNORMAL HIGH (ref 0.00–0.07)
Basophils Absolute: 0.1 10*3/uL (ref 0.0–0.1)
Basophils Relative: 2 %
Eosinophils Absolute: 0.1 10*3/uL (ref 0.0–0.5)
Eosinophils Relative: 2 %
HCT: 41.3 % (ref 39.0–52.0)
Hemoglobin: 13.6 g/dL (ref 13.0–17.0)
Immature Granulocytes: 2 %
Lymphocytes Relative: 23 %
Lymphs Abs: 1.1 10*3/uL (ref 0.7–4.0)
MCH: 27.8 pg (ref 26.0–34.0)
MCHC: 32.9 g/dL (ref 30.0–36.0)
MCV: 84.3 fL (ref 80.0–100.0)
Monocytes Absolute: 0.5 10*3/uL (ref 0.1–1.0)
Monocytes Relative: 11 %
Neutro Abs: 3 10*3/uL (ref 1.7–7.7)
Neutrophils Relative %: 60 %
Platelet Count: 161 10*3/uL (ref 150–400)
RBC: 4.9 MIL/uL (ref 4.22–5.81)
RDW: 14 % (ref 11.5–15.5)
Smear Review: NORMAL
WBC Count: 5 10*3/uL (ref 4.0–10.5)
nRBC: 0 % (ref 0.0–0.2)

## 2023-04-19 LAB — CMP (CANCER CENTER ONLY)
ALT: 27 U/L (ref 0–44)
AST: 27 U/L (ref 15–41)
Albumin: 4.1 g/dL (ref 3.5–5.0)
Alkaline Phosphatase: 129 U/L — ABNORMAL HIGH (ref 38–126)
Anion gap: 8 (ref 5–15)
BUN: 12 mg/dL (ref 6–20)
CO2: 26 mmol/L (ref 22–32)
Calcium: 8.9 mg/dL (ref 8.9–10.3)
Chloride: 101 mmol/L (ref 98–111)
Creatinine: 1.02 mg/dL (ref 0.61–1.24)
GFR, Estimated: >60 mL/min (ref >60)
Glucose, Bld: 98 mg/dL (ref 70–99)
Potassium: 3.5 mmol/L (ref 3.5–5.1)
Sodium: 135 mmol/L (ref 135–145)
Total Bilirubin: 0.4 mg/dL (ref 0.3–1.2)
Total Protein: 6.7 g/dL (ref 6.5–8.1)

## 2023-04-19 LAB — LACTATE DEHYDROGENASE: LDH: 204 U/L — ABNORMAL HIGH (ref 98–192)

## 2023-04-19 LAB — PHOSPHORUS: Phosphorus: 3.7 mg/dL (ref 2.5–4.6)

## 2023-04-19 LAB — URIC ACID: Uric Acid, Serum: 5.3 mg/dL (ref 3.7–8.6)

## 2023-04-19 MED ORDER — SODIUM CHLORIDE 0.9% FLUSH
10.0000 mL | Freq: Once | INTRAVENOUS | Status: AC
  Administered 2023-04-19: 10 mL via INTRAVENOUS
  Filled 2023-04-19: qty 10

## 2023-04-19 MED ORDER — HEPARIN SOD (PORK) LOCK FLUSH 100 UNIT/ML IV SOLN
500.0000 [IU] | Freq: Once | INTRAVENOUS | Status: AC
  Administered 2023-04-19: 500 [IU] via INTRAVENOUS
  Filled 2023-04-19: qty 5

## 2023-04-19 NOTE — Assessment & Plan Note (Addendum)
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  Recommend BV + CHP , restage with PET after 3 cycles   Labs are reviewed and discussed with patient. Tolerated  BV-CHP with D2 GCSF History of genitle herpes, recommend acyclovir prophylaxis.  BID

## 2023-04-19 NOTE — Progress Notes (Signed)
Pt here for follow up post first treatment. Pt reports he was sick the day after, but has improved since then.

## 2023-04-19 NOTE — Progress Notes (Signed)
Hematology/Oncology Progress note [phone] [phone]      CHIEF COMPLAINTS/PURPOSE OF CONSULTATION:  ALK negative, anaplastic large cell lymphoma.   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  Recommend BV + CHP , restage with PET after 3 cycles   Labs are reviewed and discussed with patient. Tolerated  BV-CHP with D2 GCSF History of genitle herpes, recommend acyclovir prophylaxis.  BID   Chemotherapy-induced nausea Continue antiemetics PRN    Orders Placed This Encounter  Procedures   NM PET Image Restag (PS) Skull Base To Thigh    Standing Status:   Future    Standing Expiration Date:   04/18/2024    Order Specific Question:   If indicated for the ordered procedure, I authorize the administration of a radiopharmaceutical per Radiology protocol    Answer:   Yes    Order Specific Question:   Preferred imaging location?    Answer:   [facility]   Follow up  2 weeks lab MD BV-CHP D2 GSCF All questions were answered. The patient knows to call the clinic with any problems, questions or concerns.  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 02/09/2023 CT abdomen pelvis with contrast showed massive lymphadenopathy of left inguinal groin,  largest measures 6.1 x 5.5 x 4.4 cm  Smalt fat containing left inguinal hernia.  5 mm left solid pulmonary nodule.  Urine was negative for chlamydia and gonorrhea  patient was given empiric antibiotics and referred to establish care with oncology for further evaluation.   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  Excisional biopsy was recommended.    Cytogenetic FISH studies: DUSP22 rearrangement positive, TP63 rearrangement negative.   03/12/2023, left inguinal excisional lymph node biopsy showed CD30 positive T-cell lymphoma, most consistent with ALK negative anaplastic large cell lymphoma.    03/21/2023, bone marrow biopsy showed normocellular bone marrow for age with trilineage hematopoiesis.  No significant CD34 positive blastic population identified.  No monoclonal B-cell population or significant T-cell abnormalities identified     03/14/2023 Imaging   PET scan showed  1. Hypermetabolic left inguinal and left external iliac lymph nodes consistent with lymphomatous disease involvement. 2. Diffuse low-level FDG avidity throughout the axial and appendicular skeleton is nonspecific likely physiologic less likely reflecting lymphomatous involvement. 3. No splenomegaly. 4. Symmetric hypermetabolic hyperplasia of the tonsils is commonly reactive. 5. Tiny fat containing left inguinal hernia. 6. Aortic Atherosclerosis    03/22/2023 Cancer Staging   Staging form: Hodgkin and Non-Hodgkin Lymphoma, AJCC 8th Edition - Clinical stage from 03/22/2023: Stage II (Peripheral T-cell lymphoma) - Signed by Rickard Patience, MD on 04/05/2023 Stage prefix: Initial diagnosis  04/03/2023 Echocardiogram   LVEF 60 to 65%    04/12/2023 -  Chemotherapy   Patient is on Treatment  Plan : LYMPHOMA  A + CHP (Brentuximab + Cyclophosphamide + Doxorubicin + Prednisone) q21 Days x 6 Cycles     04/12/2023 Procedure   Medi port placed by Dr. Maia Plan    Reviewed previous medical history. 01/08/2014-01/08/2021, patient presented emergency room for headache,.  LP was done which showed lymphocytic predominant pleocytosis, HSV 2+, RPR reactive 1:4, HIV and crypto Ag negative.  He was treated with HSV meningitis with acyclovir.  Patient also was treated for skin eruptions secondary syphilis with penicillin injections.   INTERVAL HISTORY Randy [name]. is a 42 y.o. male who has above history reviewed by me today presents for follow up visit for Anaplastic ALK-negative large cell lymphoma  + stomach discomfort/nausea, tried nausea pills with relief.  + decreased appetite for 2-3 days.  No vomiting, diarrhea.    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    Migraines    Seizures     SURGICAL HISTORY: Past Surgical History:  Procedure Laterality Date   INGUINAL LYMPH NODE BIOPSY Left 03/12/2023   Procedure: INGUINAL LYMPH NODE BIOPSY excisional;  Surgeon: Carolan Shiver, MD;  Location: [facility] ORS;  Service: General;  Laterality: Left;   IR BONE MARROW BIOPSY & ASPIRATION  03/21/2023   PORTACATH PLACEMENT N/A 04/11/2023   Procedure: INSERTION PORT-A-CATH;  Surgeon: Carolan Shiver, MD;  Location: [facility] ORS;  Service: General;  Laterality: N/A;    SOCIAL HISTORY: Social History   Socioeconomic History   Marital status: Single    Spouse name: Not on file   Number of children: Not on file   Years of education: Not on file   Highest education level: Not on file  Occupational History   Not on file  Tobacco Use   Smoking status: Former    Years: 15    Types: Cigarettes    Quit date: 03/25/2022    Years since quitting: 1.0   Smokeless tobacco: Not on file  Vaping Use    Vaping Use: Every day   Start date: 03/27/2023  Substance and Sexual Activity   Alcohol use: Yes   Drug use: Yes    Types: Marijuana   Sexual activity: Not on file  Other Topics Concern   Not on file  Social History Narrative   Married and live at home with wife and children. He is a Curator.    Social Determinants of Health   Financial Resource Strain: Low Risk  (02/13/2023)   Overall Financial Resource Strain (CARDIA)    Difficulty of Paying Living Expenses: Not hard at all  Food Insecurity: No Food Insecurity (02/13/2023)   Hunger Vital Sign    Worried About Running Out of Food in the Last Year: Never true    Ran Out of Food in the Last Year: Never true  Transportation Needs: No Transportation Needs (02/13/2023)   PRAPARE - Administrator, Civil Service (Medical): No    Lack of Transportation (Non-Medical): No  Physical Activity: Not on file  Stress: No Stress Concern Present (02/13/2023)   [employer] of Occupational Health - Occupational Stress Questionnaire    Feeling of Stress : Not at all  Social Connections: Not on file  Intimate Partner Violence: Not At Risk (02/13/2023)   Humiliation, Afraid, Rape, and Kick questionnaire  Fear of Current or Ex-Partner: No    Emotionally Abused: No    Physically Abused: No    Sexually Abused: No    FAMILY HISTORY: Family History  Problem Relation Age of Onset   Stroke Father    Diabetes Father     ALLERGIES:  has No Known Allergies.  MEDICATIONS:  Current Outpatient Medications  Medication Sig Dispense Refill   acyclovir (ZOVIRAX) 400 MG tablet Take 1 tablet (400 mg total) by mouth 2 (two) times daily. 60 tablet 1   chlorhexidine (PERIDEX) 0.12 % solution Use as directed 15 mLs in the mouth or throat 2 (two) times daily. 473 mL 3   lidocaine-prilocaine (EMLA) cream Apply to affected area once 30 g 3   ondansetron (ZOFRAN) 8 MG tablet Take 1 tablet (8 mg total) by mouth every 8 (eight) hours as needed for  nausea or vomiting. Start on the third day after cyclophosphamide chemotherapy. 30 tablet 1   predniSONE (DELTASONE) 20 MG tablet Take 5 tablets (100 mg total) by mouth daily. Take on days 2-5 of chemotherapy. 20 tablet 7   prochlorperazine (COMPAZINE) 10 MG tablet Take 1 tablet (10 mg total) by mouth every 6 (six) hours as needed for nausea or vomiting. 30 tablet 6   traMADol (ULTRAM) 50 MG tablet Take 1 tablet (50 mg total) by mouth every 6 (six) hours as needed. (Patient taking differently: Take 50 mg by mouth every 6 (six) hours as needed for moderate pain.) 10 tablet 0   No current facility-administered medications for this visit.   Facility-Administered Medications Ordered in Other Visits  Medication Dose Route Frequency Provider Last Rate Last Admin   heparin lock flush 100 unit/mL  500 Units Intravenous Once Rickard Patience, MD        Review of Systems  Constitutional:  Negative for appetite change, chills, fatigue, fever and unexpected weight change.  HENT:   Negative for hearing loss and voice change.   Eyes:  Negative for eye problems and icterus.  Respiratory:  Negative for chest tightness, cough and shortness of breath.   Cardiovascular:  Negative for chest pain and leg swelling.  Gastrointestinal:  Negative for abdominal distention and abdominal pain.  Endocrine: Negative for hot flashes.  Genitourinary:  Negative for difficulty urinating, dysuria and frequency.        Left inguinal mass  Musculoskeletal:  Negative for arthralgias.  Skin:  Negative for itching and rash.  Neurological:  Negative for light-headedness and numbness.  Hematological:  Negative for adenopathy. Does not bruise/bleed easily.  Psychiatric/Behavioral:  Negative for confusion.      PHYSICAL EXAMINATION: ECOG PERFORMANCE STATUS: 0 - Asymptomatic  Vitals:   04/19/23 1104  BP: (!) 162/111  Pulse: 81  Resp: 18  Temp: 98.3 F (36.8 C)   Filed Weights   04/19/23 1104  Weight: 253 lb (114.8 kg)     Physical Exam Constitutional:      General: He is not in acute distress. HENT:     Head: Normocephalic and atraumatic.  Eyes:     General: No scleral icterus. Cardiovascular:     Rate and Rhythm: Normal rate and regular rhythm.     Heart sounds: No murmur heard. Pulmonary:     Effort: Pulmonary effort is normal. No respiratory distress.  Abdominal:     General: There is no distension.     Palpations: Abdomen is soft.  Genitourinary:    Comments: Left inguinal massive lymphadenopathy, fixed  Musculoskeletal:  General: Normal range of motion.     Cervical back: Normal range of motion and neck supple.  Skin:    General: Skin is warm and dry.     Findings: No erythema.  Neurological:     Mental Status: He is alert and oriented to person, place, and time. Mental status is at baseline.     Cranial Nerves: No cranial nerve deficit.     Motor: No abnormal muscle tone.  Psychiatric:        Mood and Affect: Mood and affect normal.     RN Merleen Nicely served as chaperone during GU examination LABORATORY DATA:  I have reviewed the data as listed    Latest Ref Rng & Units 04/12/2023    9:08 AM 03/21/2023    8:13 AM 02/13/2023   12:00 PM  CBC  WBC 4.0 - 10.5 K/uL 6.3  9.3  6.8   Hemoglobin 13.0 - 17.0 g/dL 09.8  11.9  14.7   Hematocrit 39.0 - 52.0 % 44.1  47.6  45.7   Platelets 150 - 400 K/uL 312  278  304       Latest Ref Rng & Units 04/12/2023    9:08 AM 02/13/2023   12:00 PM 02/09/2023    6:55 PM  CMP  Glucose 70 - 99 mg/dL 829  96  562   BUN 6 - 20 mg/dL 13  11  10    Creatinine 0.61 - 1.24 mg/dL 1.30  8.65  7.84   Sodium 135 - 145 mmol/L 138  139  136   Potassium 3.5 - 5.1 mmol/L 3.5  4.0  3.8   Chloride 98 - 111 mmol/L 104  104  100   CO2 22 - 32 mmol/L 25  25  25    Calcium 8.9 - 10.3 mg/dL 9.0  9.4  9.4   Total Protein 6.5 - 8.1 g/dL 7.3  8.4  7.8   Total Bilirubin 0.3 - 1.2 mg/dL 0.5  0.7  1.1   Alkaline Phos 38 - 126 U/L 102  110  95   AST 15 - 41  U/L 25  34  34   ALT 0 - 44 U/L 20  39  31      RADIOGRAPHIC STUDIES: I have personally reviewed the radiological images as listed and agreed with the findings in the report. DG Chest Port 1 View  Result Date: 04/11/2023 CLINICAL DATA:  Port-A-Cath placement. EXAM: PORTABLE CHEST 1 VIEW COMPARISON:  Chest radiographs 04/20/2017.  PET-03/14/2023. FINDINGS: The cardiomediastinal silhouette is accentuated by portable AP technique and low lung volumes. A right jugular Port-A-Cath has been placed and terminates near the superior cavoatrial junction. External artifact is noted over the right lung base and lower mediastinum. Within this limitation, no definite airspace consolidation, overt pulmonary edema, sizable pleural effusion, or pneumothorax is identified. No acute osseous abnormality is seen. IMPRESSION: Right jugular Port-A-Cath as above. Low lung volumes. Electronically Signed   By: Sebastian Ache M.D.   On: 04/11/2023 11:03   DG C-Arm 1-60 Min-No Report  Result Date: 04/11/2023 Fluoroscopy was utilized by the requesting physician.  No radiographic interpretation.   ECHOCARDIOGRAM COMPLETE  Result Date: 04/03/2023    ECHOCARDIOGRAM REPORT   Patient Name:   Randy [name]. Date of Exam: 04/03/2023 Medical Rec #:  [id number]          Height:       73.0 in Accession #:    [id number]  Weight:       249.3 lb Date of Birth:  Feb 10, 1981         BSA:          2.363 m Patient Age:    41 years           BP:           143/106 mmHg Patient Gender: M                  HR:           72 bpm. Exam Location:  [facility] Procedure: 2D Echo, Cardiac Doppler and Color Doppler Indications:     Chemo  History:         Patient has no prior history of Echocardiogram examinations.                  Lymphoma.  Sonographer:     Mikki Harbor Referring Phys:  [id number] Demarrio Menges Diagnosing Phys: Yvonne Kendall MD  Sonographer Comments: Global longitudinal strain was attempted. IMPRESSIONS  1. Left ventricular ejection fraction,  by estimation, is 60 to 65%. The left ventricle has normal function. The left ventricle has no regional wall motion abnormalities. Left ventricular diastolic parameters were normal. The average left ventricular global longitudinal strain is -21.8 %. The global longitudinal strain is normal.  2. Right ventricular systolic function is normal. The right ventricular size is normal.  3. The mitral valve is normal in structure. Trivial mitral valve regurgitation.  4. The aortic valve is tricuspid. Aortic valve regurgitation is not visualized. No aortic stenosis is present.  5. The inferior vena cava is dilated in size with >50% respiratory variability, suggesting right atrial pressure of 8 mmHg. FINDINGS  Left Ventricle: Left ventricular ejection fraction, by estimation, is 60 to 65%. The left ventricle has normal function. The left ventricle has no regional wall motion abnormalities. The average left ventricular global longitudinal strain is -21.8 %. The global longitudinal strain is normal. The left ventricular internal cavity size was normal in size. There is borderline left ventricular hypertrophy. Left ventricular diastolic parameters were normal. Right Ventricle: The right ventricular size is normal. No increase in right ventricular wall thickness. Right ventricular systolic function is normal. Left Atrium: Left atrial size was normal in size. Right Atrium: Right atrial size was normal in size. Pericardium: There is no evidence of pericardial effusion. Mitral Valve: The mitral valve is normal in structure. Trivial mitral valve regurgitation. MV peak gradient, 3.5 mmHg. The mean mitral valve gradient is 2.0 mmHg. Tricuspid Valve: The tricuspid valve is normal in structure. Tricuspid valve regurgitation is trivial. Aortic Valve: The aortic valve is tricuspid. Aortic valve regurgitation is not visualized. No aortic stenosis is present. Aortic valve mean gradient measures 4.0 mmHg. Aortic valve peak gradient measures  7.0 mmHg. Aortic valve area, by VTI measures 2.67 cm. Pulmonic Valve: The pulmonic valve was normal in structure. Pulmonic valve regurgitation is trivial. No evidence of pulmonic stenosis. Aorta: The aortic root is normal in size and structure. Pulmonary Artery: The pulmonary artery is of normal size. Venous: The inferior vena cava is dilated in size with greater than 50% respiratory variability, suggesting right atrial pressure of 8 mmHg. IAS/Shunts: No atrial level shunt detected by color flow Doppler.  LEFT VENTRICLE PLAX 2D LVIDd:         4.80 cm   Diastology LVIDs:         3.20 cm   LV e' medial:    9.46 cm/s  LV PW:         1.19 cm   LV E/e' medial:  8.4 LV IVS:        1.10 cm   LV e' lateral:   11.70 cm/s LVOT diam:     2.20 cm   LV E/e' lateral: 6.8 LV SV:         84 LV SV Index:   36        2D Longitudinal Strain LVOT Area:     3.80 cm  2D Strain GLS Avg:     -21.8 %  RIGHT VENTRICLE RV Basal diam:  3.70 cm RV Mid diam:    3.80 cm RV S prime:     13.40 cm/s TAPSE (M-mode): 2.5 cm LEFT ATRIUM             Index        RIGHT ATRIUM           Index LA diam:        3.90 cm 1.65 cm/m   RA Area:     15.60 cm LA Vol (A2C):   65.9 ml 27.89 ml/m  RA Volume:   34.40 ml  14.56 ml/m LA Vol (A4C):   44.1 ml 18.66 ml/m LA Biplane Vol: 57.8 ml 24.46 ml/m  AORTIC VALVE                    PULMONIC VALVE AV Area (Vmax):    3.28 cm     PV Vmax:       0.93 m/s AV Area (Vmean):   2.95 cm     PV Peak grad:  3.4 mmHg AV Area (VTI):     2.67 cm AV Vmax:           132.00 cm/s AV Vmean:          92.300 cm/s AV VTI:            0.315 m AV Peak Grad:      7.0 mmHg AV Mean Grad:      4.0 mmHg LVOT Vmax:         114.00 cm/s LVOT Vmean:        71.600 cm/s LVOT VTI:          0.221 m LVOT/AV VTI ratio: 0.70  AORTA Ao Root diam: 3.40 cm MITRAL VALVE MV Area (PHT): 3.81 cm    SHUNTS MV Area VTI:   2.79 cm    Systemic VTI:  0.22 m MV Peak grad:  3.5 mmHg    Systemic Diam: 2.20 cm MV Mean grad:  2.0 mmHg MV Vmax:       0.93 m/s MV  Vmean:      60.0 cm/s MV Decel Time: 199 msec MV E velocity: 79.40 cm/s MV A velocity: 61.40 cm/s MV E/A ratio:  1.29 Cristal Deer End MD Electronically signed by Yvonne Kendall MD Signature Date/Time: 04/03/2023/5:58:15 PM    Final    IR BONE MARROW BIOPSY & ASPIRATION  Result Date: 03/21/2023 INDICATION: lymphadenopathy EXAM: IR BONE MARROW BIOPSY AND ASPIRATION MEDICATIONS: None. ANESTHESIA/SEDATION: Moderate (conscious) sedation was employed during this procedure. A total of Versed 2 mg and Fentanyl 100 mcg was administered intravenously. Moderate Sedation Time: 10 minutes. The patient's level of consciousness and vital signs were monitored continuously by radiology nursing throughout the procedure under my direct supervision. FLUOROSCOPY TIME:  Fluoroscopy Time: 0.6 minutes (10 mGy) COMPLICATIONS: None immediate. PROCEDURE: Informed written consent was obtained from the patient after a  thorough discussion of the procedural risks, benefits and alternatives. All questions were addressed. Maximal Sterile Barrier Technique was utilized including caps, mask, sterile gowns, sterile gloves, sterile drape, hand hygiene and skin antiseptic. A timeout was performed prior to the initiation of the procedure. The patient was placed prone on the IR exam table. Limited fluoroscopy of the pelvis was performed for planning purposes. Skin entry site was marked, and the overlying skin was prepped and draped in the standard sterile fashion. Local analgesia was obtained with 1% lidocaine. Using fluoroscopic guidance, an 11 gauge needle was advanced just deep to the cortex of the right posterior ilium. Subsequently, bone marrow aspiration and core biopsy were performed. Dry tap was noted. An additional core was obtained. Specimens were submitted to lab/pathology for handling. Hemostasis was achieved with manual pressure, and a clean dressing was placed. The patient tolerated the procedure well without immediate complication.  IMPRESSION: Successful bone marrow core biopsy using fluoroscopic guidance. Attempts at bone marrow aspiration yielded a dry tap. Electronically Signed   By: Olive Bass M.D.   On: 03/21/2023 11:23

## 2023-04-19 NOTE — Assessment & Plan Note (Signed)
Continue antiemetics PRN

## 2023-05-03 ENCOUNTER — Inpatient Hospital Stay: Payer: 59

## 2023-05-03 ENCOUNTER — Encounter: Payer: Self-pay | Admitting: Oncology

## 2023-05-03 ENCOUNTER — Inpatient Hospital Stay: Payer: 59 | Attending: Oncology

## 2023-05-03 ENCOUNTER — Inpatient Hospital Stay (HOSPITAL_BASED_OUTPATIENT_CLINIC_OR_DEPARTMENT_OTHER): Payer: 59 | Admitting: Oncology

## 2023-05-03 VITALS — BP 134/105 | HR 75 | Temp 97.6°F | Resp 18 | Wt 254.5 lb

## 2023-05-03 DIAGNOSIS — R110 Nausea: Secondary | ICD-10-CM

## 2023-05-03 DIAGNOSIS — C8475 Anaplastic large cell lymphoma, ALK-negative, lymph nodes of inguinal region and lower limb: Secondary | ICD-10-CM | POA: Diagnosis not present

## 2023-05-03 DIAGNOSIS — Z79620 Long term (current) use of immunosuppressive biologic: Secondary | ICD-10-CM | POA: Diagnosis not present

## 2023-05-03 DIAGNOSIS — Z5111 Encounter for antineoplastic chemotherapy: Secondary | ICD-10-CM | POA: Diagnosis not present

## 2023-05-03 DIAGNOSIS — T451X5A Adverse effect of antineoplastic and immunosuppressive drugs, initial encounter: Secondary | ICD-10-CM | POA: Diagnosis not present

## 2023-05-03 DIAGNOSIS — Z5189 Encounter for other specified aftercare: Secondary | ICD-10-CM | POA: Insufficient documentation

## 2023-05-03 DIAGNOSIS — Z5112 Encounter for antineoplastic immunotherapy: Secondary | ICD-10-CM | POA: Diagnosis not present

## 2023-05-03 LAB — CMP (CANCER CENTER ONLY)
ALT: 40 U/L (ref 0–44)
AST: 27 U/L (ref 15–41)
Albumin: 4 g/dL (ref 3.5–5.0)
Alkaline Phosphatase: 89 U/L (ref 38–126)
Anion gap: 8 (ref 5–15)
BUN: 18 mg/dL (ref 6–20)
CO2: 24 mmol/L (ref 22–32)
Calcium: 9 mg/dL (ref 8.9–10.3)
Chloride: 107 mmol/L (ref 98–111)
Creatinine: 1.22 mg/dL (ref 0.61–1.24)
GFR, Estimated: >60 mL/min (ref >60)
Glucose, Bld: 108 mg/dL — ABNORMAL HIGH (ref 70–99)
Potassium: 3.6 mmol/L (ref 3.5–5.1)
Sodium: 139 mmol/L (ref 135–145)
Total Bilirubin: 0.4 mg/dL (ref 0.3–1.2)
Total Protein: 6.7 g/dL (ref 6.5–8.1)

## 2023-05-03 LAB — CBC WITH DIFFERENTIAL (CANCER CENTER ONLY)
Abs Immature Granulocytes: 0.03 10*3/uL (ref 0.00–0.07)
Basophils Absolute: 0.1 10*3/uL (ref 0.0–0.1)
Basophils Relative: 1 %
Eosinophils Absolute: 0 10*3/uL (ref 0.0–0.5)
Eosinophils Relative: 0 %
HCT: 39.4 % (ref 39.0–52.0)
Hemoglobin: 13.2 g/dL (ref 13.0–17.0)
Immature Granulocytes: 0 %
Lymphocytes Relative: 16 %
Lymphs Abs: 1.2 10*3/uL (ref 0.7–4.0)
MCH: 28.4 pg (ref 26.0–34.0)
MCHC: 33.5 g/dL (ref 30.0–36.0)
MCV: 84.9 fL (ref 80.0–100.0)
Monocytes Absolute: 0.9 10*3/uL (ref 0.1–1.0)
Monocytes Relative: 12 %
Neutro Abs: 5.3 10*3/uL (ref 1.7–7.7)
Neutrophils Relative %: 71 %
Platelet Count: 406 10*3/uL — ABNORMAL HIGH (ref 150–400)
RBC: 4.64 MIL/uL (ref 4.22–5.81)
RDW: 15.1 % (ref 11.5–15.5)
WBC Count: 7.4 10*3/uL (ref 4.0–10.5)
nRBC: 0 % (ref 0.0–0.2)

## 2023-05-03 MED ORDER — ACETAMINOPHEN 325 MG PO TABS
650.0000 mg | ORAL_TABLET | Freq: Once | ORAL | Status: AC
  Administered 2023-05-03: 650 mg via ORAL
  Filled 2023-05-03: qty 2

## 2023-05-03 MED ORDER — DIPHENHYDRAMINE HCL 50 MG/ML IJ SOLN
50.0000 mg | Freq: Once | INTRAMUSCULAR | Status: AC
  Administered 2023-05-03: 50 mg via INTRAVENOUS
  Filled 2023-05-03: qty 1

## 2023-05-03 MED ORDER — SODIUM CHLORIDE 0.9 % IV SOLN
Freq: Once | INTRAVENOUS | Status: AC
  Filled 2023-05-03: qty 250

## 2023-05-03 MED ORDER — SODIUM CHLORIDE 0.9 % IV SOLN
750.0000 mg/m2 | Freq: Once | INTRAVENOUS | Status: AC
  Administered 2023-05-03: 2000 mg via INTRAVENOUS
  Filled 2023-05-03: qty 100

## 2023-05-03 MED ORDER — SODIUM CHLORIDE 0.9 % IV SOLN
180.0000 mg | Freq: Once | INTRAVENOUS | Status: AC
  Administered 2023-05-03: 180 mg via INTRAVENOUS
  Filled 2023-05-03: qty 36

## 2023-05-03 MED ORDER — SODIUM CHLORIDE 0.9 % IV SOLN
10.0000 mg | Freq: Once | INTRAVENOUS | Status: AC
  Administered 2023-05-03: 10 mg via INTRAVENOUS
  Filled 2023-05-03: qty 10

## 2023-05-03 MED ORDER — SODIUM CHLORIDE 0.9 % IV SOLN
150.0000 mg | Freq: Once | INTRAVENOUS | Status: AC
  Administered 2023-05-03: 150 mg via INTRAVENOUS
  Filled 2023-05-03: qty 150

## 2023-05-03 MED ORDER — HEPARIN SOD (PORK) LOCK FLUSH 100 UNIT/ML IV SOLN
500.0000 [IU] | Freq: Once | INTRAVENOUS | Status: AC | PRN
  Administered 2023-05-03: 500 [IU]
  Filled 2023-05-03: qty 5

## 2023-05-03 MED ORDER — PALONOSETRON HCL INJECTION 0.25 MG/5ML
0.2500 mg | Freq: Once | INTRAVENOUS | Status: AC
  Administered 2023-05-03: 0.25 mg via INTRAVENOUS
  Filled 2023-05-03: qty 5

## 2023-05-03 MED ORDER — DOXORUBICIN HCL CHEMO IV INJECTION 2 MG/ML
50.0000 mg/m2 | Freq: Once | INTRAVENOUS | Status: AC
  Administered 2023-05-03: 120 mg via INTRAVENOUS
  Filled 2023-05-03: qty 60

## 2023-05-03 NOTE — Assessment & Plan Note (Signed)
Continue antiemetics PRN 

## 2023-05-03 NOTE — Assessment & Plan Note (Addendum)
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  Recommend BV + CHP , restage with PET after 3 cycles   Labs are reviewed and discussed with patient. Proceed with BV-CHP with D2 GCSF, he will take Prednisone D2-5 History of genitle herpes, recommend acyclovir prophylaxis. 400mg  BID

## 2023-05-03 NOTE — Patient Instructions (Signed)

## 2023-05-03 NOTE — Assessment & Plan Note (Signed)
Chemotherapy plan as listed above 

## 2023-05-03 NOTE — Patient Instructions (Signed)
[facility]  Discharge Instructions: Thank you for choosing [facility] to provide your oncology and hematology care.  If you have a lab appointment with the Cancer Center, please go directly to the Cancer Center and check in at the registration area.  Wear comfortable clothing and clothing appropriate for easy access to any Portacath or PICC line.   We strive to give you quality time with your provider. You may need to reschedule your appointment if you arrive late (15 or more minutes).  Arriving late affects you and other patients whose appointments are after yours.  Also, if you miss three or more appointments without notifying the office, you may be dismissed from the clinic at the provider's discretion.      For prescription refill requests, have your pharmacy contact our office and allow 72 hours for refills to be completed.    Today you received the following chemotherapy and/or immunotherapy agents Doxorubicin, Cytoxan and Adcetris.      To help prevent nausea and vomiting after your treatment, we encourage you to take your nausea medication as directed.  BELOW ARE SYMPTOMS THAT SHOULD BE REPORTED IMMEDIATELY: *FEVER GREATER THAN 100.4 F (38 C) OR HIGHER *CHILLS OR SWEATING *NAUSEA AND VOMITING THAT IS NOT CONTROLLED WITH YOUR NAUSEA MEDICATION *UNUSUAL SHORTNESS OF BREATH *UNUSUAL BRUISING OR BLEEDING *URINARY PROBLEMS (pain or burning when urinating, or frequent urination) *BOWEL PROBLEMS (unusual diarrhea, constipation, pain near the anus) TENDERNESS IN MOUTH AND THROAT WITH OR WITHOUT PRESENCE OF ULCERS (sore throat, sores in mouth, or a toothache) UNUSUAL RASH, SWELLING OR PAIN  UNUSUAL VAGINAL DISCHARGE OR ITCHING   Items with * indicate a potential emergency and should be followed up as soon as possible or go to the Emergency Department if any problems should occur.  Please show the CHEMOTHERAPY ALERT CARD or IMMUNOTHERAPY  ALERT CARD at check-in to the Emergency Department and triage nurse.  Should you have questions after your visit or need to cancel or reschedule your appointment, please contact [facility]  [phone] and follow the prompts.  Office hours are 8:00 a.m. to 4:30 p.m. Monday - Friday. Please note that voicemails left after 4:00 p.m. may not be returned until the following business day.  We are closed weekends and major holidays. You have access to a nurse at all times for urgent questions. Please call the main number to the clinic [phone] and follow the prompts.  For any non-urgent questions, you may also contact your provider using MyChart. We now offer e-Visits for anyone 39 and older to request care online for non-urgent symptoms. For details visit [email or web address].   Also download the MyChart app! Go to the app store, search "MyChart", open the app, select [facility], and log in with your MyChart username and password.

## 2023-05-03 NOTE — Progress Notes (Signed)
Hematology/Oncology Progress note [phone] [phone]      CHIEF COMPLAINTS/PURPOSE OF CONSULTATION:  ALK negative, anaplastic large cell lymphoma.   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  Recommend BV + CHP , restage with PET after 3 cycles   Labs are reviewed and discussed with patient. Proceed with BV-CHP with D2 GCSF, he will take Prednisone D2-5 History of genitle herpes, recommend acyclovir prophylaxis. 400mg  BID   Encounter for antineoplastic chemotherapy Chemotherapy plan as listed above  Chemotherapy-induced nausea Continue antiemetics PRN    Orders Placed This Encounter  Procedures   NM PET Image Restag (PS) Skull Base To Thigh    Standing Status:   Future    Standing Expiration Date:   05/02/2024    Order Specific Question:   If indicated for the ordered procedure, I authorize the administration of a radiopharmaceutical per Radiology protocol    Answer:   Yes    Order Specific Question:   Preferred imaging location?    Answer:   [facility]   Follow up  3 weeks lab MD BV-CHP D2 GSCF All questions were answered. The patient knows to call the clinic with any problems, questions or concerns.  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 02/09/2023 CT abdomen pelvis with contrast showed massive lymphadenopathy of left inguinal groin, largest measures 6.1 x 5.5 x 4.4 cm  Smalt fat containing left inguinal hernia.  5 mm left solid pulmonary nodule.  Urine was negative for chlamydia and gonorrhea  patient was given empiric antibiotics and referred to establish care with oncology for further evaluation.   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  Excisional biopsy was recommended.    Cytogenetic FISH studies: DUSP22 rearrangement positive, TP63 rearrangement negative.   03/12/2023, left inguinal excisional lymph node biopsy showed CD30 positive T-cell lymphoma, most consistent with ALK negative anaplastic large cell lymphoma.    03/21/2023, bone marrow biopsy showed normocellular bone marrow for age with trilineage hematopoiesis.  No significant CD34 positive blastic population identified.  No monoclonal B-cell population or significant T-cell abnormalities identified     03/14/2023 Imaging   PET scan showed  1. Hypermetabolic left inguinal and left external iliac lymph nodes consistent with lymphomatous disease involvement. 2. Diffuse low-level FDG avidity throughout the axial and appendicular skeleton is nonspecific likely physiologic less likely reflecting lymphomatous involvement. 3. No splenomegaly. 4. Symmetric hypermetabolic hyperplasia of the tonsils is commonly reactive. 5. Tiny fat containing left inguinal hernia. 6. Aortic Atherosclerosis    03/22/2023 Cancer Staging   Staging form: Hodgkin and Non-Hodgkin Lymphoma, AJCC 8th Edition - Clinical stage from  03/22/2023: Stage II (Peripheral T-cell lymphoma) - Signed by Rickard Patience, MD on  04/05/2023 Stage prefix: Initial diagnosis   04/03/2023 Echocardiogram   LVEF 60 to 65%    04/12/2023 -  Chemotherapy   Patient is on Treatment Plan : LYMPHOMA  A + CHP (Brentuximab + Cyclophosphamide + Doxorubicin + Prednisone) q21 Days x 6 Cycles     04/12/2023 Procedure   Medi port placed by Dr. Maia Plan    Reviewed previous medical history. 01/08/2014-01/08/2021, patient presented emergency room for headache,.  [name] was done which showed lymphocytic predominant pleocytosis, HSV 2+, RPR reactive 1:4, HIV and crypto Ag negative.  He was treated with HSV meningitis with acyclovir.  Patient also was treated for skin eruptions secondary syphilis with penicillin injections.   INTERVAL HISTORY Randy [name]. is a 42 y.o. male who has above history reviewed by me today presents for follow up visit for Anaplastic ALK-negative large cell lymphoma  He tolerated chemotherapy well. Manageable nausea for a few days after chemotherapy No vomiting, diarrhea.    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 ([name])    Migraines    Seizures ([name])     SURGICAL HISTORY: Past Surgical History:  Procedure Laterality Date   INGUINAL LYMPH NODE BIOPSY Left 03/12/2023   Procedure: INGUINAL LYMPH NODE BIOPSY excisional;  Surgeon: Carolan Shiver, MD;  Location: [facility] ORS;  Service: General;  Laterality: Left;   IR BONE MARROW BIOPSY & ASPIRATION  03/21/2023   PORTACATH PLACEMENT N/A 04/11/2023   Procedure: INSERTION PORT-A-CATH;  Surgeon: Carolan Shiver, MD;  Location: [facility] ORS;  Service: General;  Laterality: N/A;    SOCIAL HISTORY: Social History   Socioeconomic History   Marital status: Single    Spouse name: Not on file   Number of children: Not on file   Years of education: Not on file   Highest education level: Not on file  Occupational History   Not on file  Tobacco Use   Smoking status:  Former    Years: 15    Types: Cigarettes    Quit date: 03/25/2022    Years since quitting: 1.1   Smokeless tobacco: Not on file  Vaping Use   Vaping Use: Every day   Start date: 03/27/2023  Substance and Sexual Activity   Alcohol use: Yes   Drug use: Yes    Types: Marijuana   Sexual activity: Not on file  Other Topics Concern   Not on file  Social History Narrative   Married and live at home with wife and children. He is a Curator.    Social Determinants of Health   Financial Resource Strain: Low Risk  (02/13/2023)   Overall Financial Resource Strain (CARDIA)    Difficulty of Paying Living Expenses: Not hard at all  Food Insecurity: No Food Insecurity (02/13/2023)   Hunger Vital Sign    Worried About Running Out of Food in the Last Year: Never true    Ran Out of Food in the Last Year: Never true  Transportation Needs: No Transportation Needs (02/13/2023)   PRAPARE - Administrator, Civil Service (Medical): No    Lack of Transportation (Non-Medical): No  Physical Activity: Not on file  Stress: No Stress Concern Present (02/13/2023)   [employer] of Occupational Health - Occupational Stress Questionnaire    Feeling of Stress : Not at all  Social Connections: Not on file  Intimate Partner Violence: Not At Risk (02/13/2023)   Humiliation, Afraid, Rape, and Kick questionnaire    Fear of Current or Ex-Partner: No    Emotionally Abused: No    Physically Abused: No    Sexually Abused: No    FAMILY HISTORY: Family History  Problem Relation Age of Onset   Stroke Father    Diabetes Father     ALLERGIES:  has No Known Allergies.  MEDICATIONS:  Current Outpatient Medications  Medication Sig Dispense Refill   acyclovir (ZOVIRAX) 400 MG tablet Take 1 tablet (400 mg total) by mouth 2 (two) times daily. 60 tablet 1   chlorhexidine (PERIDEX) 0.12 % solution Use as directed 15 mLs in the mouth or throat 2 (two) times daily. 473 mL 3   lidocaine-prilocaine (EMLA) cream  Apply to affected area once 30 g 3   ondansetron (ZOFRAN) 8 MG tablet Take 1 tablet (8 mg total) by mouth every 8 (eight) hours as needed for nausea or vomiting. Start on the third day after cyclophosphamide chemotherapy. 30 tablet 1   predniSONE (DELTASONE) 20 MG tablet Take 5 tablets (100 mg total) by mouth daily. Take on days 2-5 of chemotherapy. 20 tablet 7   prochlorperazine (COMPAZINE) 10 MG tablet Take 1 tablet (10 mg total) by mouth every 6 (six) hours as needed for nausea or vomiting. 30 tablet 6   traMADol (ULTRAM) 50 MG tablet Take 1 tablet (50 mg total) by mouth every 6 (six) hours as needed. (Patient taking differently: Take 50 mg by mouth every 6 (six) hours as needed for moderate pain.) 10 tablet 0   No current facility-administered medications for this visit.   Facility-Administered Medications Ordered in Other Visits  Medication Dose Route Frequency Provider Last Rate Last Admin   brentuximab vedotin (ADCETRIS) 180 mg in sodium chloride 0.9 % 100 mL chemo infusion  180 mg Intravenous Once Rickard Patience, MD       cyclophosphamide (CYTOXAN) 2,000 mg in sodium chloride 0.9 % 500 mL chemo infusion  750 mg/m2 (Treatment Plan Recorded) Intravenous Once Rickard Patience, MD 600 mL/hr at 05/03/23 1043 2,000 mg at 05/03/23 1043   [COMPLETED] heparin lock flush 100 unit/mL  500 Units Intracatheter Once PRN Rickard Patience, MD   500 Units at 05/03/23 1128    Review of Systems  Constitutional:  Negative for appetite change, chills, fatigue, fever and unexpected weight change.  HENT:   Negative for hearing loss and voice change.   Eyes:  Negative for eye problems and icterus.  Respiratory:  Negative for chest tightness, cough and shortness of breath.   Cardiovascular:  Negative for chest pain and leg swelling.  Gastrointestinal:  Negative for abdominal distention and abdominal pain.  Endocrine: Negative for hot flashes.  Genitourinary:  Negative for difficulty urinating, dysuria and frequency.        Left  inguinal mass  Musculoskeletal:  Negative for arthralgias.  Skin:  Negative for itching and rash.  Neurological:  Negative for light-headedness and numbness.  Hematological:  Negative for adenopathy. Does not bruise/bleed easily.  Psychiatric/Behavioral:  Negative for confusion.      PHYSICAL EXAMINATION: ECOG PERFORMANCE STATUS: 0 - Asymptomatic  Vitals:   05/03/23 0859  BP: (!) 134/105  Pulse: 75  Resp: 18  Temp: 97.6 F (36.4 C)   Filed Weights   05/03/23 0859  Weight: 254 lb 8 oz (115.4 kg)    Physical Exam Constitutional:      General: He is not in acute distress. HENT:  Head: Normocephalic and atraumatic.  Eyes:     General: No scleral icterus. Cardiovascular:     Rate and Rhythm: Normal rate and regular rhythm.     Heart sounds: No murmur heard. Pulmonary:     Effort: Pulmonary effort is normal. No respiratory distress.  Abdominal:     General: There is no distension.     Palpations: Abdomen is soft.  Genitourinary:    Comments: Left inguinal massive lymphadenopathy, fixed  Musculoskeletal:        General: Normal range of motion.     Cervical back: Normal range of motion and neck supple.  Skin:    General: Skin is warm and dry.     Findings: No erythema.  Neurological:     Mental Status: He is alert and oriented to person, place, and time. Mental status is at baseline.     Cranial Nerves: No cranial nerve deficit.     Motor: No abnormal muscle tone.  Psychiatric:        Mood and Affect: Mood and affect normal.     RN Merleen Nicely served as chaperone during GU examination LABORATORY DATA:  I have reviewed the data as listed    Latest Ref Rng & Units 05/03/2023    8:45 AM 04/19/2023   10:52 AM 04/12/2023    9:08 AM  CBC  WBC 4.0 - 10.5 K/uL 7.4  5.0  6.3   Hemoglobin 13.0 - 17.0 g/dL 16.1  09.6  04.5   Hematocrit 39.0 - 52.0 % 39.4  41.3  44.1   Platelets 150 - 400 K/uL 406  161  312       Latest Ref Rng & Units 05/03/2023    8:45 AM  04/19/2023   10:52 AM 04/12/2023    9:08 AM  CMP  Glucose 70 - 99 mg/dL 409  98  811   BUN 6 - 20 mg/dL 18  12  13    Creatinine 0.61 - 1.24 mg/dL 9.14  7.82  9.56   Sodium 135 - 145 mmol/L 139  135  138   Potassium 3.5 - 5.1 mmol/L 3.6  3.5  3.5   Chloride 98 - 111 mmol/L 107  101  104   CO2 22 - 32 mmol/L 24  26  25    Calcium 8.9 - 10.3 mg/dL 9.0  8.9  9.0   Total Protein 6.5 - 8.1 g/dL 6.7  6.7  7.3   Total Bilirubin 0.3 - 1.2 mg/dL 0.4  0.4  0.5   Alkaline Phos 38 - 126 U/L 89  129  102   AST 15 - 41 U/L 27  27  25    ALT 0 - 44 U/L 40  27  20      RADIOGRAPHIC STUDIES: I have personally reviewed the radiological images as listed and agreed with the findings in the report. DG Chest Port 1 View  Result Date: 04/11/2023 CLINICAL DATA:  Port-A-Cath placement. EXAM: PORTABLE CHEST 1 VIEW COMPARISON:  Chest radiographs 04/20/2017.  PET-03/14/2023. FINDINGS: The cardiomediastinal silhouette is accentuated by portable AP technique and low lung volumes. A right jugular Port-A-Cath has been placed and terminates near the superior cavoatrial junction. External artifact is noted over the right lung base and lower mediastinum. Within this limitation, no definite airspace consolidation, overt pulmonary edema, sizable pleural effusion, or pneumothorax is identified. No acute osseous abnormality is seen. IMPRESSION: Right jugular Port-A-Cath as above. Low lung volumes. Electronically Signed   By: Jolaine Click.D.  On: 04/11/2023 11:03   DG C-Arm 1-60 Min-No Report  Result Date: 04/11/2023 Fluoroscopy was utilized by the requesting physician.  No radiographic interpretation.

## 2023-05-03 NOTE — Progress Notes (Signed)
Patient here for follow up. Pt reports he has rash on right leg.

## 2023-05-04 ENCOUNTER — Ambulatory Visit (INDEPENDENT_AMBULATORY_CARE_PROVIDER_SITE_OTHER): Payer: 59 | Admitting: Nurse Practitioner

## 2023-05-04 ENCOUNTER — Inpatient Hospital Stay: Payer: 59

## 2023-05-04 ENCOUNTER — Encounter: Payer: Self-pay | Admitting: Nurse Practitioner

## 2023-05-04 VITALS — BP 154/92 | HR 98 | Temp 98.0°F | Ht 73.0 in | Wt 257.8 lb

## 2023-05-04 DIAGNOSIS — C8475 Anaplastic large cell lymphoma, ALK-negative, lymph nodes of inguinal region and lower limb: Secondary | ICD-10-CM

## 2023-05-04 DIAGNOSIS — E669 Obesity, unspecified: Secondary | ICD-10-CM

## 2023-05-04 DIAGNOSIS — Z6834 Body mass index (BMI) 34.0-34.9, adult: Secondary | ICD-10-CM

## 2023-05-04 DIAGNOSIS — Z79620 Long term (current) use of immunosuppressive biologic: Secondary | ICD-10-CM | POA: Diagnosis not present

## 2023-05-04 DIAGNOSIS — L409 Psoriasis, unspecified: Secondary | ICD-10-CM

## 2023-05-04 DIAGNOSIS — I10 Essential (primary) hypertension: Secondary | ICD-10-CM | POA: Diagnosis not present

## 2023-05-04 DIAGNOSIS — Z5111 Encounter for antineoplastic chemotherapy: Secondary | ICD-10-CM | POA: Diagnosis not present

## 2023-05-04 DIAGNOSIS — L309 Dermatitis, unspecified: Secondary | ICD-10-CM

## 2023-05-04 DIAGNOSIS — Z5112 Encounter for antineoplastic immunotherapy: Secondary | ICD-10-CM | POA: Diagnosis not present

## 2023-05-04 DIAGNOSIS — Z5189 Encounter for other specified aftercare: Secondary | ICD-10-CM | POA: Diagnosis not present

## 2023-05-04 MED ORDER — VALSARTAN-HYDROCHLOROTHIAZIDE 80-12.5 MG PO TABS: 1.0000 | ORAL_TABLET | Freq: Every day | ORAL | 1 refills | Status: DC

## 2023-05-04 MED ORDER — PEGFILGRASTIM-JMDB 6 MG/0.6ML ~~LOC~~ SOSY
6.0000 mg | PREFILLED_SYRINGE | Freq: Once | SUBCUTANEOUS | Status: AC
  Administered 2023-05-04: 6 mg via SUBCUTANEOUS
  Filled 2023-05-04: qty 0.6

## 2023-05-04 NOTE — Patient Instructions (Addendum)
Valsartan-HCTZ sent to pharmacy. Check your blood pressure once a day same time for 2 weeks and send [place] the readings on mychart. Please ask the oncologist to add lipid panel to the lab Managing Your High Blood Pressure For the person with high blood pressure, learn what blood pressure is, what the numbers mean, and what you can do to help keep your blood pressure in a normal range.     To view the content, go to this web address: [email or web address]  This video will expire on: 12/06/2024. If you need access to this video following this date, please reach out to the healthcare provider who assigned it to you. This information is not intended to replace advice given to you by your health care provider. Make sure you discuss any questions you have with your health care provider. Elsevier Patient Education  2023 [employer].

## 2023-05-04 NOTE — Progress Notes (Signed)
New Patient Office Visit  Subjective    Patient ID: Randy [name]., male    DOB: 1981/02/07  Age: 42 y.o. MRN: [id number]  CC:  Chief Complaint  Patient presents with   Establish Care    HPI Generoso [name]. presents to establish care. He has history of seizure as a child, Anaplastic ALK on chemotherapy, and migraine. He works at [employer] as a Curator.   He reports dry and itchy patch on the right leg and small spot on the left knee.   He is overall doing well no new complaints at present.   Health Maintenance  Topic Date Due   COVID-19 Vaccine (1) Never done   DTaP/Tdap/Td (1 - Tdap) Never done   INFLUENZA VACCINE  07/26/2023   Hepatitis C Screening  Completed   HIV Screening  Completed   HPV VACCINES  Aged Out    There are no preventive care reminders to display for this patient.  Outpatient Encounter Medications as of 05/04/2023  Medication Sig   acyclovir (ZOVIRAX) 400 MG tablet Take 1 tablet (400 mg total) by mouth 2 (two) times daily.   chlorhexidine (PERIDEX) 0.12 % solution Use as directed 15 mLs in the mouth or throat 2 (two) times daily.   lidocaine-prilocaine (EMLA) cream Apply to affected area once (Patient taking differently: Apply 1 Application topically once. Apply to affected area once)   ondansetron (ZOFRAN) 8 MG tablet Take 1 tablet (8 mg total) by mouth every 8 (eight) hours as needed for nausea or vomiting. Start on the third day after cyclophosphamide chemotherapy.   predniSONE (DELTASONE) 20 MG tablet Take 5 tablets (100 mg total) by mouth daily. Take on days 2-5 of chemotherapy.   prochlorperazine (COMPAZINE) 10 MG tablet Take 1 tablet (10 mg total) by mouth every 6 (six) hours as needed for nausea or vomiting.   traMADol (ULTRAM) 50 MG tablet Take 1 tablet (50 mg total) by mouth every 6 (six) hours as needed. (Patient taking differently: Take 50 mg by mouth every 6 (six) hours as needed for moderate pain.)   [DISCONTINUED]  valsartan-hydrochlorothiazide (DIOVAN-HCT) 80-12.5 MG tablet Take 1 tablet by mouth daily. (Patient not taking: Reported on 05/09/2023)   No facility-administered encounter medications on file as of 05/04/2023.    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 ([name])    Migraines    Seizures ([name])     Past Surgical History:  Procedure Laterality Date   INGUINAL LYMPH NODE BIOPSY Left 03/12/2023   Procedure: INGUINAL LYMPH NODE BIOPSY excisional;  Surgeon: Carolan Shiver, MD;  Location: [facility] ORS;  Service: General;  Laterality: Left;   IR BONE MARROW BIOPSY & ASPIRATION  03/21/2023   PORTACATH PLACEMENT N/A 04/11/2023   Procedure: INSERTION PORT-A-CATH;  Surgeon: Carolan Shiver, MD;  Location: [facility] ORS;  Service: General;  Laterality: N/A;    Family History  Problem Relation Age of Onset   Diabetes Mother    Hypertension Mother    Hypertension Father    Stroke Father    Diabetes Father    Cancer Paternal Grandmother        breat cancer    Social History   Socioeconomic History   Marital status: Married    Spouse name: Not on file   Number of children: Not on file   Years of education: Not on file   Highest education level: Not on file  Occupational History  Not on file  Tobacco Use   Smoking status: Former    Years: 15    Types: Cigarettes    Quit date: 03/25/2022    Years since quitting: 1.1   Smokeless tobacco: Not on file  Vaping Use   Vaping Use: Some days   Start date: 03/27/2023  Substance and Sexual Activity   Alcohol use: Yes    Comment: occasionaly 1-2 a year   Drug use: Yes    Frequency: 5.0 times per week    Types: Marijuana    Comment: smoke 4-5 times a week.   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 He is a Curator.    Social Determinants of Health   Financial  Resource Strain: Low Risk  (02/13/2023)   Overall Financial Resource Strain (CARDIA)    Difficulty of Paying Living Expenses: Not hard at all  Food Insecurity: No Food Insecurity (05/10/2023)   Hunger Vital Sign    Worried About Running Out of Food in the Last Year: Never true    Ran Out of Food in the Last Year: Never true  Transportation Needs: No Transportation Needs (05/10/2023)   PRAPARE - Administrator, Civil Service (Medical): No    Lack of Transportation (Non-Medical): No  Physical Activity: Not on file  Stress: No Stress Concern Present (02/13/2023)   [employer] of Occupational Health - Occupational Stress Questionnaire    Feeling of Stress : Not at all  Social Connections: Not on file  Intimate Partner Violence: Not At Risk (05/10/2023)   Humiliation, Afraid, Rape, and Kick questionnaire    Fear of Current or Ex-Partner: No    Emotionally Abused: No    Physically Abused: No    Sexually Abused: No    Review of Systems  Constitutional: Negative.   HENT: Negative.    Eyes: Negative.   Respiratory: Negative.    Cardiovascular: Negative.   Gastrointestinal: Negative.   Genitourinary: Negative.   Musculoskeletal: Negative.   Skin:        Dry patches to lower extremity  Neurological: Negative.   Psychiatric/Behavioral: Negative.          Objective    BP (!) 154/92   Pulse 98   Temp 98 F (36.7 C) (Oral)   Ht 6\' 1"  (1.854 m)   Wt 257 lb 12.8 oz (116.9 kg)   SpO2 97%   BMI 34.01 kg/m   Physical Exam Constitutional:      Appearance: Normal appearance. He is normal weight.  HENT:     Head: Normocephalic.     Right Ear: Tympanic membrane normal.     Left Ear: Tympanic membrane normal.     Mouth/Throat:     Mouth: Mucous membranes are moist.  Eyes:     Extraocular Movements: Extraocular movements intact.     Conjunctiva/sclera: Conjunctivae normal.     Pupils: Pupils are equal, round, and reactive to light.  Neck:     Thyroid: No thyroid  mass or thyroid tenderness.  Cardiovascular:     Rate and Rhythm: Normal rate and regular rhythm.     Pulses: Normal pulses.     Heart sounds: Normal heart sounds. No murmur heard. Pulmonary:     Effort: Pulmonary effort is normal.     Breath sounds: Normal breath sounds.  Abdominal:     General: Bowel sounds are normal.  Palpations: Abdomen is soft. There is no mass.     Tenderness: There is no abdominal tenderness. There is no rebound.  Musculoskeletal:        General: No swelling.     Cervical back: Neck supple. No tenderness.     Right lower leg: No edema.     Left lower leg: No edema.  Skin:    Findings: Rash present. No bruising or erythema. Rash is crusting (to lower extremity).  Neurological:     General: No focal deficit present.     Mental Status: He is alert and oriented to person, place, and time. Mental status is at baseline.  Psychiatric:        Mood and Affect: Mood normal.        Behavior: Behavior normal.        Thought Content: Thought content normal.        Judgment: Judgment normal.         Assessment & Plan:  Hypertension, unspecified type Assessment & Plan: Patient BP  Vitals:   05/04/23 1401  BP: (!) 154/92    in the office. Advised pt to follow a low sodium and heart healthy diet. Advised patient to check blood pressure at home and bring readings to the next appointment. Will start on valsartan HCTZ     Dermatitis Assessment & Plan: Advised patient to keep the skin hydrated. Use over-the-counter HCTZ for pruritus. Will continue to monitor   Obesity (BMI 30-39.9) Assessment & Plan: Body mass index is 34.01 kg/m. Advised pt to lose weight. Advised patient to avoid trans fat, fatty and fried food. Follow a regular physical activity schedule.      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     Return in about 1 month (around  06/04/2023) for hypertension.   Kara Dies, NP

## 2023-05-05 ENCOUNTER — Inpatient Hospital Stay
Admission: EM | Admit: 2023-05-05 | Discharge: 2023-05-09 | DRG: 872 | Disposition: A | Discharge status: Home / Self Care | Payer: 59 | Attending: Family Medicine | Admitting: Family Medicine

## 2023-05-05 ENCOUNTER — Other Ambulatory Visit: Payer: Self-pay

## 2023-05-05 ENCOUNTER — Emergency Department: Payer: 59

## 2023-05-05 DIAGNOSIS — A419 Sepsis, unspecified organism: Secondary | ICD-10-CM | POA: Diagnosis not present

## 2023-05-05 DIAGNOSIS — C8475 Anaplastic large cell lymphoma, ALK-negative, lymph nodes of inguinal region and lower limb: Secondary | ICD-10-CM | POA: Diagnosis present

## 2023-05-05 DIAGNOSIS — Z6832 Body mass index (BMI) 32.0-32.9, adult: Secondary | ICD-10-CM

## 2023-05-05 DIAGNOSIS — Z95828 Presence of other vascular implants and grafts: Secondary | ICD-10-CM

## 2023-05-05 DIAGNOSIS — Z1152 Encounter for screening for COVID-19: Secondary | ICD-10-CM

## 2023-05-05 DIAGNOSIS — D72829 Elevated white blood cell count, unspecified: Secondary | ICD-10-CM

## 2023-05-05 DIAGNOSIS — K047 Periapical abscess without sinus: Secondary | ICD-10-CM | POA: Diagnosis present

## 2023-05-05 DIAGNOSIS — X58XXXA Exposure to other specified factors, initial encounter: Secondary | ICD-10-CM | POA: Diagnosis present

## 2023-05-05 DIAGNOSIS — R112 Nausea with vomiting, unspecified: Secondary | ICD-10-CM | POA: Diagnosis present

## 2023-05-05 DIAGNOSIS — Z823 Family history of stroke: Secondary | ICD-10-CM

## 2023-05-05 DIAGNOSIS — Z79899 Other long term (current) drug therapy: Secondary | ICD-10-CM

## 2023-05-05 DIAGNOSIS — E876 Hypokalemia: Secondary | ICD-10-CM | POA: Diagnosis present

## 2023-05-05 DIAGNOSIS — T451X5A Adverse effect of antineoplastic and immunosuppressive drugs, initial encounter: Secondary | ICD-10-CM | POA: Diagnosis present

## 2023-05-05 DIAGNOSIS — R6520 Severe sepsis without septic shock: Secondary | ICD-10-CM | POA: Diagnosis present

## 2023-05-05 DIAGNOSIS — R7401 Elevation of levels of liver transaminase levels: Secondary | ICD-10-CM | POA: Diagnosis present

## 2023-05-05 DIAGNOSIS — R110 Nausea: Secondary | ICD-10-CM | POA: Diagnosis present

## 2023-05-05 DIAGNOSIS — Z833 Family history of diabetes mellitus: Secondary | ICD-10-CM

## 2023-05-05 DIAGNOSIS — I10 Essential (primary) hypertension: Secondary | ICD-10-CM | POA: Diagnosis present

## 2023-05-05 DIAGNOSIS — K029 Dental caries, unspecified: Secondary | ICD-10-CM | POA: Diagnosis present

## 2023-05-05 DIAGNOSIS — Z87891 Personal history of nicotine dependence: Secondary | ICD-10-CM

## 2023-05-05 DIAGNOSIS — C8470 Anaplastic large cell lymphoma, ALK-negative, unspecified site: Secondary | ICD-10-CM | POA: Diagnosis present

## 2023-05-05 DIAGNOSIS — E669 Obesity, unspecified: Secondary | ICD-10-CM | POA: Diagnosis present

## 2023-05-05 DIAGNOSIS — R590 Localized enlarged lymph nodes: Secondary | ICD-10-CM | POA: Diagnosis present

## 2023-05-05 LAB — COMPREHENSIVE METABOLIC PANEL
ALT: 39 U/L (ref 0–44)
AST: 37 U/L (ref 15–41)
Albumin: 4.2 g/dL (ref 3.5–5.0)
Alkaline Phosphatase: 120 U/L (ref 38–126)
Anion gap: 11 (ref 5–15)
BUN: 16 mg/dL (ref 6–20)
CO2: 24 mmol/L (ref 22–32)
Calcium: 9.1 mg/dL (ref 8.9–10.3)
Chloride: 102 mmol/L (ref 98–111)
Creatinine, Ser: 0.96 mg/dL (ref 0.61–1.24)
GFR, Estimated: >60 mL/min (ref >60)
Glucose, Bld: 100 mg/dL — ABNORMAL HIGH (ref 70–99)
Potassium: 3.2 mmol/L — ABNORMAL LOW (ref 3.5–5.1)
Sodium: 137 mmol/L (ref 135–145)
Total Bilirubin: 0.7 mg/dL (ref 0.3–1.2)
Total Protein: 7.1 g/dL (ref 6.5–8.1)

## 2023-05-05 LAB — CBC
HCT: 42.1 % (ref 39.0–52.0)
MCHC: 33.3 g/dL (ref 30.0–36.0)
RBC: 4.94 MIL/uL (ref 4.22–5.81)

## 2023-05-05 LAB — LIPASE, BLOOD: Lipase: 99 U/L — ABNORMAL HIGH (ref 11–51)

## 2023-05-05 MED ORDER — LACTATED RINGERS IV BOLUS
1000.0000 mL | Freq: Once | INTRAVENOUS | Status: AC
  Administered 2023-05-06: 1000 mL via INTRAVENOUS

## 2023-05-05 MED ORDER — ONDANSETRON HCL 4 MG/2ML IJ SOLN
4.0000 mg | Freq: Once | INTRAMUSCULAR | Status: AC
  Administered 2023-05-06: 4 mg via INTRAVENOUS
  Filled 2023-05-05: qty 2

## 2023-05-05 NOTE — ED Provider Notes (Signed)
   [facility] Provider Note    Event Date/Time   First MD Initiated Contact with Patient 05/05/23 2324     (approximate)   History   Emesis and Dental Pain   HPI  Randy Sullo. is a 42 y.o. male who presents to the ED for evaluation of Emesis and Dental Pain   I review oncology clinic visit from 5/9.  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  Port in place  Patient presents to the ED with his wife for evaluation of malaise, emesis and mandibular dental pain.  Last chemoinfusion this past Thursday.  Physical Exam   Triage Vital Signs: ED Triage Vitals  Enc Vitals Group     BP 05/05/23 2256 (!) 162/114     Pulse Rate 05/05/23 2256 95     Resp 05/05/23 2256 (!) 22     Temp 05/05/23 2256 98.1 F (36.7 C)     Temp Source 05/05/23 2256 Oral     SpO2 05/05/23 2256 97 %     Weight 05/05/23 2255 250 lb (113.4 kg)     Height 05/05/23 2255 6\' 1"  (1.854 m)     Head Circumference --      Peak Flow --      Pain Score 05/05/23 2254 5     Pain Loc --      Pain Edu? --      Excl. in GC? --     Most recent vital signs: Vitals:   05/05/23 2256  BP: (!) 162/114  Pulse: 95  Resp: (!) 22  Temp: 98.1 F (36.7 C)  SpO2: 97%    General: Awake, no distress. *** CV:  Good peripheral perfusion.  Resp:  Normal effort.  Abd:  No distention.  MSK:  No deformity noted.  Neuro:  No focal deficits appreciated. Other:     ED Results / Procedures / Treatments   Labs (all labs ordered are listed, but only abnormal results are displayed) Labs Reviewed  CBC - Abnormal; Notable for the following components:      Result Value   RDW 15.9 (*)    Platelets 438 (*)    All other components within normal limits  SARS CORONAVIRUS 2 BY RT PCR  LIPASE, BLOOD  COMPREHENSIVE METABOLIC PANEL  URINALYSIS, ROUTINE W REFLEX MICROSCOPIC  LACTIC ACID, PLASMA  LACTIC ACID, PLASMA    EKG Sinus rhythm with a rate of 92 bpm.  Normal axis  and intervals.  Nonspecific ST changes laterally and inferiorly without STEMI.  RADIOLOGY ***  Official radiology report(s): No results found.  PROCEDURES and INTERVENTIONS:  Procedures  Medications - No data to display   IMPRESSION / MDM / ASSESSMENT AND PLAN / ED COURSE  I reviewed the triage vital signs and the nursing notes.  Differential diagnosis includes, but is not limited to, ***  {Patient presents with symptoms of an acute illness or injury that is potentially life-threatening.}      FINAL CLINICAL IMPRESSION(S) / ED DIAGNOSES   Final diagnoses:  None     Rx / DC Orders   ED Discharge Orders     None        Note:  This document was prepared using Dragon voice recognition software and may include unintentional dictation errors.

## 2023-05-05 NOTE — ED Notes (Signed)
Lab called with critical WBC of 55.6

## 2023-05-05 NOTE — ED Triage Notes (Addendum)
Pt to ED via POV c/o nausea/ vomiting and chills. Pt reports undergoing chemo on Thursday. Pt usually feels sick afterwards but pt states this feels different. Pt also complaining of toothache that started today as well. Pt states he's been feeling weaker than normal. Denies abd pain, diarrhe, fevers, cp, sob

## 2023-05-06 ENCOUNTER — Emergency Department: Payer: 59

## 2023-05-06 DIAGNOSIS — X58XXXA Exposure to other specified factors, initial encounter: Secondary | ICD-10-CM | POA: Diagnosis not present

## 2023-05-06 DIAGNOSIS — R6520 Severe sepsis without septic shock: Secondary | ICD-10-CM | POA: Diagnosis not present

## 2023-05-06 DIAGNOSIS — Z6832 Body mass index (BMI) 32.0-32.9, adult: Secondary | ICD-10-CM | POA: Diagnosis not present

## 2023-05-06 DIAGNOSIS — C8475 Anaplastic large cell lymphoma, ALK-negative, lymph nodes of inguinal region and lower limb: Secondary | ICD-10-CM

## 2023-05-06 DIAGNOSIS — K029 Dental caries, unspecified: Secondary | ICD-10-CM | POA: Diagnosis present

## 2023-05-06 DIAGNOSIS — K047 Periapical abscess without sinus: Secondary | ICD-10-CM | POA: Diagnosis not present

## 2023-05-06 DIAGNOSIS — I159 Secondary hypertension, unspecified: Secondary | ICD-10-CM | POA: Diagnosis not present

## 2023-05-06 DIAGNOSIS — E876 Hypokalemia: Secondary | ICD-10-CM | POA: Diagnosis not present

## 2023-05-06 DIAGNOSIS — E669 Obesity, unspecified: Secondary | ICD-10-CM | POA: Diagnosis present

## 2023-05-06 DIAGNOSIS — C8470 Anaplastic large cell lymphoma, ALK-negative, unspecified site: Secondary | ICD-10-CM | POA: Diagnosis not present

## 2023-05-06 DIAGNOSIS — N179 Acute kidney failure, unspecified: Secondary | ICD-10-CM | POA: Diagnosis not present

## 2023-05-06 DIAGNOSIS — N3289 Other specified disorders of bladder: Secondary | ICD-10-CM | POA: Diagnosis not present

## 2023-05-06 DIAGNOSIS — Z823 Family history of stroke: Secondary | ICD-10-CM | POA: Diagnosis not present

## 2023-05-06 DIAGNOSIS — A419 Sepsis, unspecified organism: Secondary | ICD-10-CM | POA: Diagnosis not present

## 2023-05-06 DIAGNOSIS — Z95828 Presence of other vascular implants and grafts: Secondary | ICD-10-CM | POA: Diagnosis not present

## 2023-05-06 DIAGNOSIS — R110 Nausea: Secondary | ICD-10-CM | POA: Diagnosis not present

## 2023-05-06 DIAGNOSIS — D72829 Elevated white blood cell count, unspecified: Secondary | ICD-10-CM

## 2023-05-06 DIAGNOSIS — Z833 Family history of diabetes mellitus: Secondary | ICD-10-CM | POA: Diagnosis not present

## 2023-05-06 DIAGNOSIS — K0889 Other specified disorders of teeth and supporting structures: Secondary | ICD-10-CM | POA: Diagnosis not present

## 2023-05-06 DIAGNOSIS — R55 Syncope and collapse: Secondary | ICD-10-CM | POA: Diagnosis present

## 2023-05-06 DIAGNOSIS — Z87891 Personal history of nicotine dependence: Secondary | ICD-10-CM | POA: Diagnosis not present

## 2023-05-06 DIAGNOSIS — I10 Essential (primary) hypertension: Secondary | ICD-10-CM | POA: Diagnosis not present

## 2023-05-06 DIAGNOSIS — R7401 Elevation of levels of liver transaminase levels: Secondary | ICD-10-CM | POA: Diagnosis not present

## 2023-05-06 DIAGNOSIS — R112 Nausea with vomiting, unspecified: Secondary | ICD-10-CM | POA: Diagnosis not present

## 2023-05-06 DIAGNOSIS — R590 Localized enlarged lymph nodes: Secondary | ICD-10-CM | POA: Diagnosis not present

## 2023-05-06 DIAGNOSIS — Z79899 Other long term (current) drug therapy: Secondary | ICD-10-CM | POA: Diagnosis not present

## 2023-05-06 DIAGNOSIS — K4090 Unilateral inguinal hernia, without obstruction or gangrene, not specified as recurrent: Secondary | ICD-10-CM | POA: Diagnosis not present

## 2023-05-06 DIAGNOSIS — N281 Cyst of kidney, acquired: Secondary | ICD-10-CM | POA: Diagnosis not present

## 2023-05-06 DIAGNOSIS — T451X5A Adverse effect of antineoplastic and immunosuppressive drugs, initial encounter: Secondary | ICD-10-CM | POA: Diagnosis not present

## 2023-05-06 DIAGNOSIS — Z1152 Encounter for screening for COVID-19: Secondary | ICD-10-CM | POA: Diagnosis not present

## 2023-05-06 DIAGNOSIS — R945 Abnormal results of liver function studies: Secondary | ICD-10-CM | POA: Diagnosis not present

## 2023-05-06 HISTORY — DX: Sepsis, unspecified organism: A41.9

## 2023-05-06 LAB — LACTIC ACID, PLASMA
Lactic Acid, Venous: 2.3 mmol/L (ref 0.5–1.9)
Lactic Acid, Venous: 2.2 mmol/L (ref 0.5–1.9)
Lactic Acid, Venous: 2.3 mmol/L (ref 0.5–1.9)
Lactic Acid, Venous: 2.9 mmol/L (ref 0.5–1.9)
Lactic Acid, Venous: 2.6 mmol/L (ref 0.5–1.9)

## 2023-05-06 LAB — CBC WITH DIFFERENTIAL/PLATELET
Abs Immature Granulocytes: 0 10*3/uL (ref 0.00–0.07)
Basophils Absolute: 0 10*3/uL (ref 0.0–0.1)
Basophils Relative: 0 %
Eosinophils Absolute: 0 10*3/uL (ref 0.0–0.5)
Eosinophils Relative: 0 %
HCT: 41.8 % (ref 39.0–52.0)
Hemoglobin: 13.6 g/dL (ref 13.0–17.0)
Lymphocytes Relative: 7 %
Lymphs Abs: 3.8 10*3/uL (ref 0.7–4.0)
MCH: 28.8 pg (ref 26.0–34.0)
MCHC: 32.5 g/dL (ref 30.0–36.0)
MCV: 88.6 fL (ref 80.0–100.0)
Monocytes Absolute: 1.6 10*3/uL — ABNORMAL HIGH (ref 0.1–1.0)
Monocytes Relative: 3 %
Neutro Abs: 48.5 10*3/uL — ABNORMAL HIGH (ref 1.7–7.7)
Neutrophils Relative %: 90 %
Platelets: 422 10*3/uL — ABNORMAL HIGH (ref 150–400)
RBC: 4.72 MIL/uL (ref 4.22–5.81)
RDW: 16.4 % — ABNORMAL HIGH (ref 11.5–15.5)
Smear Review: NORMAL
WBC: 53.9 10*3/uL (ref 4.0–10.5)
nRBC: 0 % (ref 0.0–0.2)

## 2023-05-06 LAB — URINALYSIS, ROUTINE W REFLEX MICROSCOPIC
Bacteria, UA: NONE SEEN
Bilirubin Urine: NEGATIVE
Glucose, UA: NEGATIVE mg/dL
Hgb urine dipstick: NEGATIVE
Ketones, ur: NEGATIVE mg/dL
Leukocytes,Ua: NEGATIVE
Nitrite: NEGATIVE
Protein, ur: NEGATIVE mg/dL
Specific Gravity, Urine: 1.015 (ref 1.005–1.030)
Squamous Epithelial / HPF: NONE SEEN /HPF (ref 0–5)
pH: 7 (ref 5.0–8.0)

## 2023-05-06 LAB — PROTIME-INR
INR: 1.1 (ref 0.8–1.2)
Prothrombin Time: 14.3 seconds (ref 11.4–15.2)

## 2023-05-06 LAB — PROCALCITONIN
Procalcitonin: 0.21 ng/mL
Procalcitonin: 0.23 ng/mL

## 2023-05-06 LAB — DIFFERENTIAL
Abs Immature Granulocytes: 9.14 10*3/uL — ABNORMAL HIGH (ref 0.00–0.07)
Basophils Absolute: 0.1 10*3/uL (ref 0.0–0.1)
Basophils Relative: 0 %
Eosinophils Absolute: 0 10*3/uL (ref 0.0–0.5)
Eosinophils Relative: 0 %
Immature Granulocytes: 17 %
Lymphocytes Relative: 2 %
Lymphs Abs: 1.2 10*3/uL (ref 0.7–4.0)
Monocytes Absolute: 2.5 10*3/uL — ABNORMAL HIGH (ref 0.1–1.0)
Monocytes Relative: 5 %
Neutro Abs: 42.6 10*3/uL — ABNORMAL HIGH (ref 1.7–7.7)
Neutrophils Relative %: 76 %

## 2023-05-06 LAB — CBC
HCT: 40.5 % (ref 39.0–52.0)
Hemoglobin: 14 g/dL (ref 13.0–17.0)
Hemoglobin: 13.4 g/dL (ref 13.0–17.0)
MCH: 28.3 pg (ref 26.0–34.0)
MCH: 28.3 pg (ref 26.0–34.0)
MCHC: 33.1 g/dL (ref 30.0–36.0)
MCV: 85.2 fL (ref 80.0–100.0)
MCV: 85.6 fL (ref 80.0–100.0)
Platelets: 438 10*3/uL — ABNORMAL HIGH (ref 150–400)
Platelets: 403 10*3/uL — ABNORMAL HIGH (ref 150–400)
RBC: 4.73 MIL/uL (ref 4.22–5.81)
RDW: 15.9 % — ABNORMAL HIGH (ref 11.5–15.5)
RDW: 15.9 % — ABNORMAL HIGH (ref 11.5–15.5)
WBC: 53.6 10*3/uL (ref 4.0–10.5)
nRBC: 0 % (ref 0.0–0.2)
nRBC: 0 % (ref 0.0–0.2)

## 2023-05-06 LAB — SARS CORONAVIRUS 2 BY RT PCR: SARS Coronavirus 2 by RT PCR: NEGATIVE

## 2023-05-06 LAB — CORTISOL-AM, BLOOD: Cortisol - AM: 7.1 ug/dL (ref 6.7–22.6)

## 2023-05-06 LAB — MAGNESIUM: Magnesium: 2.1 mg/dL (ref 1.7–2.4)

## 2023-05-06 LAB — PHOSPHORUS: Phosphorus: 2.6 mg/dL (ref 2.5–4.6)

## 2023-05-06 MED ORDER — ONDANSETRON HCL 4 MG/2ML IJ SOLN: 4.0000 mg | Freq: Four times a day (QID) | INTRAMUSCULAR | Status: DC | PRN

## 2023-05-06 MED ORDER — METRONIDAZOLE 500 MG/100ML IV SOLN
500.0000 mg | Freq: Two times a day (BID) | INTRAVENOUS | Status: DC
  Administered 2023-05-06 – 2023-05-08 (×6): 500 mg via INTRAVENOUS
  Filled 2023-05-06 (×7): qty 100

## 2023-05-06 MED ORDER — IRBESARTAN 150 MG PO TABS
75.0000 mg | ORAL_TABLET | Freq: Every day | ORAL | Status: DC
  Administered 2023-05-06 – 2023-05-09 (×4): 75 mg via ORAL
  Filled 2023-05-06 (×4): qty 1

## 2023-05-06 MED ORDER — ONDANSETRON HCL 4 MG PO TABS
4.0000 mg | ORAL_TABLET | Freq: Four times a day (QID) | ORAL | Status: DC | PRN
  Administered 2023-05-07 – 2023-05-08 (×2): 4 mg via ORAL
  Filled 2023-05-06 (×2): qty 1

## 2023-05-06 MED ORDER — ENOXAPARIN SODIUM 60 MG/0.6ML IJ SOSY
0.5000 mg/kg | PREFILLED_SYRINGE | INTRAMUSCULAR | Status: DC
  Administered 2023-05-06 – 2023-05-09 (×4): 57.5 mg via SUBCUTANEOUS
  Filled 2023-05-06 (×4): qty 0.6

## 2023-05-06 MED ORDER — KETOROLAC TROMETHAMINE 30 MG/ML IJ SOLN: 30.0000 mg | Freq: Four times a day (QID) | INTRAMUSCULAR | Status: DC | PRN

## 2023-05-06 MED ORDER — VANCOMYCIN HCL 1500 MG/300ML IV SOLN: 1500.0000 mg | Freq: Two times a day (BID) | INTRAVENOUS | Status: DC

## 2023-05-06 MED ORDER — IOHEXOL 300 MG/ML  SOLN
100.0000 mL | Freq: Once | INTRAMUSCULAR | Status: AC | PRN
  Administered 2023-05-06: 100 mL via INTRAVENOUS

## 2023-05-06 MED ORDER — CHLORHEXIDINE GLUCONATE 0.12 % MT SOLN
15.0000 mL | Freq: Two times a day (BID) | OROMUCOSAL | Status: DC
  Administered 2023-05-06 – 2023-05-09 (×7): 15 mL via OROMUCOSAL
  Filled 2023-05-06 (×6): qty 15

## 2023-05-06 MED ORDER — VANCOMYCIN HCL 1500 MG/300ML IV SOLN
1500.0000 mg | Freq: Two times a day (BID) | INTRAVENOUS | Status: DC
  Administered 2023-05-06 – 2023-05-08 (×5): 1500 mg via INTRAVENOUS
  Filled 2023-05-06 (×7): qty 300

## 2023-05-06 MED ORDER — ACYCLOVIR 200 MG PO CAPS
400.0000 mg | ORAL_CAPSULE | Freq: Two times a day (BID) | ORAL | Status: DC
  Administered 2023-05-06 – 2023-05-09 (×7): 400 mg via ORAL
  Filled 2023-05-06 (×8): qty 2

## 2023-05-06 MED ORDER — PROCHLORPERAZINE MALEATE 10 MG PO TABS: 10.0000 mg | ORAL_TABLET | Freq: Four times a day (QID) | ORAL | Status: DC | PRN

## 2023-05-06 MED ORDER — POTASSIUM CHLORIDE 10 MEQ/100ML IV SOLN
10.0000 meq | INTRAVENOUS | Status: AC
  Administered 2023-05-06 (×5): 10 meq via INTRAVENOUS
  Filled 2023-05-06 (×5): qty 100

## 2023-05-06 MED ORDER — ACETAMINOPHEN 650 MG RE SUPP: 650.0000 mg | Freq: Four times a day (QID) | RECTAL | Status: DC | PRN

## 2023-05-06 MED ORDER — SODIUM CHLORIDE 0.9 % IV SOLN
2.0000 g | Freq: Three times a day (TID) | INTRAVENOUS | Status: DC
  Administered 2023-05-06 – 2023-05-08 (×8): 2 g via INTRAVENOUS
  Filled 2023-05-06 (×2): qty 2
  Filled 2023-05-06 (×3): qty 12.5
  Filled 2023-05-06: qty 2
  Filled 2023-05-06 (×2): qty 12.5
  Filled 2023-05-06: qty 2

## 2023-05-06 MED ORDER — LACTATED RINGERS IV SOLN
150.0000 mL/h | INTRAVENOUS | Status: AC
  Administered 2023-05-06: 150 mL/h via INTRAVENOUS

## 2023-05-06 MED ORDER — HYDROCODONE-ACETAMINOPHEN 5-325 MG PO TABS: 1.0000 | ORAL_TABLET | ORAL | Status: DC | PRN

## 2023-05-06 MED ORDER — MORPHINE SULFATE (PF) 2 MG/ML IV SOLN: 2.0000 mg | INTRAVENOUS | Status: DC | PRN

## 2023-05-06 MED ORDER — VANCOMYCIN HCL IN DEXTROSE 1-5 GM/200ML-% IV SOLN
1000.0000 mg | Freq: Once | INTRAVENOUS | Status: AC
  Administered 2023-05-06: 1000 mg via INTRAVENOUS
  Filled 2023-05-06: qty 200

## 2023-05-06 MED ORDER — LACTATED RINGERS IV SOLN: INTRAVENOUS | Status: AC

## 2023-05-06 MED ORDER — VANCOMYCIN HCL 1250 MG/250ML IV SOLN
1250.0000 mg | Freq: Once | INTRAVENOUS | Status: AC
  Administered 2023-05-06: 1250 mg via INTRAVENOUS
  Filled 2023-05-06: qty 250

## 2023-05-06 MED ORDER — PREDNISONE 50 MG PO TABS
100.0000 mg | ORAL_TABLET | Freq: Every day | ORAL | Status: AC
  Administered 2023-05-06 – 2023-05-07 (×2): 100 mg via ORAL
  Filled 2023-05-06: qty 2
  Filled 2023-05-06: qty 5

## 2023-05-06 MED ORDER — MORPHINE SULFATE (PF) 4 MG/ML IV SOLN
6.0000 mg | Freq: Once | INTRAVENOUS | Status: AC
  Administered 2023-05-06: 6 mg via INTRAVENOUS
  Filled 2023-05-06: qty 2

## 2023-05-06 MED ORDER — SODIUM CHLORIDE 0.9 % IV SOLN: 2.0000 g | Freq: Three times a day (TID) | INTRAVENOUS | Status: DC

## 2023-05-06 MED ORDER — SODIUM CHLORIDE 0.9 % IV SOLN
2.0000 g | Freq: Once | INTRAVENOUS | Status: AC
  Administered 2023-05-06: 2 g via INTRAVENOUS
  Filled 2023-05-06: qty 12.5

## 2023-05-06 MED ORDER — IBUPROFEN 400 MG PO TABS: 400.0000 mg | ORAL_TABLET | Freq: Four times a day (QID) | ORAL | Status: DC | PRN

## 2023-05-06 MED ORDER — HYDROCHLOROTHIAZIDE 12.5 MG PO TABS
12.5000 mg | ORAL_TABLET | Freq: Every day | ORAL | Status: DC
  Administered 2023-05-06 – 2023-05-09 (×4): 12.5 mg via ORAL
  Filled 2023-05-06 (×4): qty 1

## 2023-05-06 MED ORDER — ACETAMINOPHEN 325 MG PO TABS
650.0000 mg | ORAL_TABLET | Freq: Four times a day (QID) | ORAL | Status: DC | PRN
  Administered 2023-05-07: 650 mg via ORAL
  Filled 2023-05-06: qty 2

## 2023-05-06 MED ORDER — VALSARTAN-HYDROCHLOROTHIAZIDE 80-12.5 MG PO TABS: 1.0000 | ORAL_TABLET | Freq: Every day | ORAL | Status: DC

## 2023-05-06 MED ORDER — VANCOMYCIN HCL 10 G IV SOLR: 2250.0000 mg | Freq: Once | INTRAVENOUS | Status: DC

## 2023-05-06 NOTE — H&P (Signed)
History and Physical    Patient: Randy [name]. [name] DOB: [birth year] DOA: 05/05/2023 DOS: the patient was seen and examined on 05/06/2023 PCP: Patient, No Pcp Per  Patient coming from: Home  Chief Complaint:  Chief Complaint  Patient presents with   Emesis   Dental Pain    HPI: Randy [name]. is a 42 y.o. male with medical history significant for Anaplastic large cell lymphoma he received his second round of chemotherapy on 05/04/2023 who presents to the ED for evaluation of nausea vomiting and chills as well as right dental pain.  Patient had chemotherapy induced nausea after his first round but states that this feels different.  He denies abdominal pain, diarrhea or dysuria and he has no chest pain or shortness of breath.ED course and data review: Afebrile, pulse 95, BP 162/114 with respirations 22 and O2 sat 97% on room air WBC 55,000 up from 7000 on 5/10 lactic acid 2.3, procalcitonin pending. EKG, personally reviewed and interpreted showing NSR at 92 with nonspecific ST-T wave changes.   Chest x-ray with no active disease. CT abdomen and pelvis showing no acute abnormality.  Enlarged left inguinal node slightly decreased from prior Maxillofacial CT with contrast showing dental caries without dental abscess or facial inflammation as follows IMPRESSION: Poor dentition with multiple periapical lucencies and dental caries, including of the most posterior 2 remaining mandibular molars. No dental abscess or facial inflammation  Patient started on sepsis fluids, vancomycin and cefepime as well as morphine and Zofran and hospitalist consulted for admission.   Review of Systems: As mentioned in the history of present illness. All other systems reviewed and are negative.  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 His smoking use included cigarettes. He does not have any smokeless tobacco history on file. He reports current alcohol use. He reports current drug use. Frequency: 5.00 times per week. Drug: Marijuana.  No Known Allergies  Family History  Problem Relation Age of Onset   Stroke Father    Diabetes Father    Cancer Paternal Grandmother        breat cancer    Prior to Admission medications   Medication Sig Start Date End Date Taking? Authorizing Provider  acyclovir (ZOVIRAX) 400 MG tablet Take 1 tablet (400 mg total) by mouth 2 (two) times daily. 04/02/23   Rickard Patience, MD  chlorhexidine (PERIDEX) 0.12 % solution Use as directed 15 mLs in the mouth or throat 2 (two) times daily. 04/12/23   Rickard Patience, MD  lidocaine-prilocaine (EMLA) cream Apply to affected area once 04/02/23   Rickard Patience, MD  ondansetron (ZOFRAN) 8 MG tablet Take 1 tablet (8 mg total) by mouth every 8 (eight) hours as needed for nausea or vomiting. Start on the third day after cyclophosphamide chemotherapy. 04/02/23   Rickard Patience, MD  predniSONE (DELTASONE) 20 MG  tablet Take 5 tablets (100 mg total) by mouth daily. Take on days 2-5 of chemotherapy. 04/02/23   Rickard Patience, MD  prochlorperazine (COMPAZINE) 10 MG tablet Take 1 tablet (10 mg total) by mouth every 6 (six) hours as needed for nausea or vomiting. 04/02/23   Rickard Patience, MD  traMADol (ULTRAM) 50 MG tablet Take 1 tablet (50 mg total) by mouth every 6 (six) hours as needed. Patient taking differently: Take 50 mg  by mouth every 6 (six) hours as needed for moderate pain. 02/09/23   Minna Antis, MD  valsartan-hydrochlorothiazide (DIOVAN-HCT) 80-12.5 MG tablet Take 1 tablet by mouth daily. 05/04/23   Kara Dies, NP    Physical Exam: Vitals:   05/05/23 2255 05/05/23 2256 05/05/23 2336  BP:  (!) 162/114 (!) 169/87  Pulse:  95 96  Resp:  (!) 22 10  Temp:  98.1 F (36.7 C) 98.3 F (36.8 C)  TempSrc:  Oral Oral  SpO2:  97% 100%  Weight: 113.4 kg    Height: 6\' 1"  (1.854 m)     Physical Exam Vitals and nursing note reviewed.  Constitutional:      General: He is not in acute distress. HENT:     Head: Normocephalic and atraumatic.  Cardiovascular:     Rate and Rhythm: Normal rate and regular rhythm.     Heart sounds: Normal heart sounds.  Pulmonary:     Effort: Pulmonary effort is normal.     Breath sounds: Normal breath sounds.  Abdominal:     Palpations: Abdomen is soft.     Tenderness: There is no abdominal tenderness.  Neurological:     Mental Status: Mental status is at baseline.     Labs on Admission: I have personally reviewed following labs and imaging studies  CBC: Recent Labs  Lab 05/03/23 0845 05/05/23 2307  WBC 7.4 55.6*  NEUTROABS 5.3 42.6*  HGB 13.2 14.0  HCT 39.4 42.1  MCV 84.9 85.2  PLT 406* 438*   Basic Metabolic Panel: Recent Labs  Lab 05/03/23 0845 05/05/23 2307  NA 139 137  K 3.6 3.2*  CL 107 102  CO2 24 24  GLUCOSE 108* 100*  BUN 18 16  CREATININE 1.22 0.96  CALCIUM 9.0 9.1   GFR: Estimated Creatinine Clearance: 136.3 mL/min (by C-G formula based on SCr of 0.96 mg/dL). Liver Function Tests: Recent Labs  Lab 05/03/23 0845 05/05/23 2307  AST 27 37  ALT 40 39  ALKPHOS 89 120  BILITOT 0.4 0.7  PROT 6.7 7.1  ALBUMIN 4.0 4.2   Recent Labs  Lab 05/05/23 2307  LIPASE 99*   No results for input(s): "AMMONIA" in the last 168 hours. Coagulation Profile: No results for input(s): "INR", "PROTIME" in the last 168 hours. Cardiac  Enzymes: No results for input(s): "CKTOTAL", "CKMB", "CKMBINDEX", "TROPONINI" in the last 168 hours. BNP (last 3 results) No results for input(s): "PROBNP" in the last 8760 hours. HbA1C: No results for input(s): "HGBA1C" in the last 72 hours. CBG: No results for input(s): "GLUCAP" in the last 168 hours. Lipid Profile: No results for input(s): "CHOL", "HDL", "LDLCALC", "TRIG", "CHOLHDL", "LDLDIRECT" in the last 72 hours. Thyroid Function Tests: No results for input(s): "TSH", "T4TOTAL", "FREET4", "T3FREE", "THYROIDAB" in the last 72 hours. Anemia Panel: No results for input(s): "VITAMINB12", "FOLATE", "FERRITIN", "TIBC", "IRON", "RETICCTPCT" in the last 72 hours. Urine analysis:    Component Value Date/Time   COLORURINE YELLOW (A) 05/06/2023 0130   APPEARANCEUR CLEAR 05/06/2023 0130   LABSPEC 1.015  05/06/2023 0130   PHURINE 7.0 05/06/2023 0130   GLUCOSEU NEGATIVE 05/06/2023 0130   HGBUR NEGATIVE 05/06/2023 0130   BILIRUBINUR NEGATIVE 05/06/2023 0130   KETONESUR NEGATIVE 05/06/2023 0130   PROTEINUR NEGATIVE 05/06/2023 0130   NITRITE NEGATIVE 05/06/2023 0130   LEUKOCYTESUR NEGATIVE 05/06/2023 0130    Radiological Exams on Admission: CT ABDOMEN PELVIS W CONTRAST  Result Date: 05/06/2023 CLINICAL DATA:  History of lymphoma with recent chemotherapy and increasing nausea and vomiting, initial encounter EXAM: CT ABDOMEN AND PELVIS WITH CONTRAST TECHNIQUE: Multidetector CT imaging of the abdomen and pelvis was performed using the standard protocol following bolus administration of intravenous contrast. RADIATION DOSE REDUCTION: This exam was performed according to the departmental dose-optimization program which includes automated exposure control, adjustment of the mA and/or kV according to patient size and/or use of iterative reconstruction technique. CONTRAST:  OMNIPAQUE IOHEXOL 300 MG/ML  SOLN COMPARISON:  03/14/2023 PET-CT FINDINGS: Lower chest: No acute abnormality. Hepatobiliary:  No focal liver abnormality is seen. No gallstones, gallbladder wall thickening, or biliary dilatation. Pancreas: Unremarkable. No pancreatic ductal dilatation or surrounding inflammatory changes. Spleen: Normal in size without focal abnormality. Adrenals/Urinary Tract: Adrenal glands are within normal limits. Kidneys are well visualized within normal enhancement pattern. Small stable cyst is noted in the left kidney. No follow-up is recommended. No obstructive changes are seen. The bladder is partially distended. Stomach/Bowel: The appendix is well visualized and within normal limits. No obstructive or inflammatory changes of the colon are seen. Stomach and small bowel are within normal limits. Vascular/Lymphatic: Atherosclerotic changes are noted. Left inguinal lymph node is noted measuring 2.1 x 1.7 cm. This is decreased in the interval from the prior PET-CT at which time it measured up to 2.8 cm. Reproductive: Prostate is unremarkable. Other: Stable fat containing left inguinal hernia is noted. No abdominopelvic ascites. Musculoskeletal: No acute or significant osseous findings. IMPRESSION: Left inguinal lymph node slightly decreased in size when compared with the prior PET-CT. No acute abnormality is identified to correspond with the given clinical history. Electronically Signed   By: Alcide Clever M.D.   On: 05/06/2023 01:17   CT Maxillofacial W Contrast  Result Date: 05/06/2023 CLINICAL DATA:  Right mandibular molar fracture EXAM: CT MAXILLOFACIAL WITH CONTRAST TECHNIQUE: Multidetector CT imaging of the maxillofacial structures was performed with intravenous contrast. Multiplanar CT image reconstructions were also generated. RADIATION DOSE REDUCTION: This exam was performed according to the departmental dose-optimization program which includes automated exposure control, adjustment of the mA and/or kV according to patient size and/or use of iterative reconstruction technique. CONTRAST:  OMNIPAQUE  IOHEXOL 300 MG/ML  SOLN COMPARISON:  None Available. FINDINGS: Osseous: Poor dentition with multiple periapical lucencies. There are caries of the most posterior 2 remaining mandibular molars. Periapical lucency at the root of tooth 8 extends to the incisive foramen. No dental abscess or facial inflammation. Orbits: Negative. No traumatic or inflammatory finding. Sinuses: Clear. Soft tissues: Negative. Limited intracranial: No significant or unexpected finding. IMPRESSION: Poor dentition with multiple periapical lucencies and dental caries, including of the most posterior 2 remaining mandibular molars. No dental abscess or facial inflammation. Electronically Signed   By: Deatra Robinson M.D.   On: 05/06/2023 01:02   DG Chest 2 View  Result Date: 05/06/2023 CLINICAL DATA:  Nausea and vomiting. EXAM: CHEST - 2 VIEW COMPARISON:  April 11, 2023 FINDINGS: There is stable right-sided venous Port-A-Cath positioning. The heart size and mediastinal contours are within normal limits. Both lungs are clear. The visualized skeletal structures  are unremarkable. IMPRESSION: No active cardiopulmonary disease. Electronically Signed   By: Aram Candela M.D.   On: 05/06/2023 00:14     Data Reviewed: Relevant notes from primary care and specialist visits, past discharge summaries as available in EHR, including Care Everywhere. Prior diagnostic testing as pertinent to current admission diagnoses Updated medications and problem lists for reconciliation ED course, including vitals, labs, imaging, treatment and response to treatment Triage notes, nursing and pharmacy notes and ED provider's notes Notable results as noted in HPI   Assessment and Plan: * Sepsis ([name]) Dental caries with dental pain vs ostealgia Possible pegfilgrastin adverse effect (ostealgia and leukocytosis of 55,000) Sepsis criteria include tachycardia, leukocytosis with lactic acidosis and jaw/dental pain without other evident septic source and  immunosuppressed patient Maxillofacial CT with contrast showing dental caries without dental abscess or facial inflammation  Marked leukocytosis might be related to GSF pegfilgrastim administered by oncology on 5/10 Per UpToDate:  Leukocytosis (WBC ?100,000/mm3) has been reported in patients receiving pegfilgrastim products/ >10%: Neuromuscular & skeletal: Ostealgia (31%)  Sepsis fluids Broad-spectrum antibiotics with cefepime vancomycin and Flagyl Follow blood cultures Consider oncology consult  Hypertension Continue home meds or formulary substitution  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 Cathie Hoops Patient received second chemotherapy infusion on 5/10 (Brentuximab + Cyclophosphamide + Doxorubicin + Prednisone) q21 Days x 6 Cycles  Day 2, Cycle 2 as well as Pegfilgrastim Symptomatic control      DVT prophylaxis: Lovenox  Consults: none  Advance Care Planning:   Code Status: Prior   Family Communication: none  Disposition Plan: Back to previous home environment  Severity of Illness: The appropriate patient status for this patient is INPATIENT. Inpatient status is judged to be reasonable and necessary in order to provide the required intensity of service to ensure the patient's safety. The patient's presenting symptoms, physical exam findings, and initial radiographic and laboratory data in the context of their chronic comorbidities is felt to place them at high risk for further clinical deterioration. Furthermore, it is not anticipated that the patient will be medically stable for discharge from the hospital within 2 midnights of admission.   * I certify that at the point of admission it is my clinical judgment that the patient will require inpatient hospital care spanning beyond 2 midnights from the point of admission due to high intensity of service, high risk for further deterioration and high  frequency of surveillance required.*  Author: Andris Baumann, MD 05/06/2023 2:16 AM  For on call review [email or web address].

## 2023-05-06 NOTE — Sepsis Progress Note (Signed)
Elink following code sepsis °

## 2023-05-06 NOTE — Progress Notes (Signed)
Randy [name]. [name] DOB: 11-08-1981 DOA: 05/05/2023 PCP: Patient, No Pcp Per   Subj: Randy [name]. is a 42 y.o. BM PMHx Seizures, Anaplastic large cell lymphoma he received his 2d round chemotherapy on 05/04/2023   Presents to the ED for evaluation of nausea vomiting and chills as well as right dental pain.  Patient had chemotherapy induced nausea after his first round but states that this feels different.  He denies abdominal pain, diarrhea or dysuria and he has no chest pain or shortness of breath.ED course and data review: Afebrile, pulse 95, BP 162/114 with respirations 22 and O2 sat 97% on room air WBC 55,000 up from 7000 on 5/10 lactic acid 2.3, procalcitonin pending. EKG, personally reviewed and interpreted showing NSR at 92 with nonspecific ST-T wave changes.   Chest x-ray with no active disease. CT abdomen and pelvis showing no acute abnormality.  Enlarged left inguinal node slightly decreased from prior Maxillofacial CT with contrast showing dental caries without dental abscess or facial inflammation as follows IMPRESSION: Poor dentition with multiple periapical lucencies and dental caries, including of the most posterior 2 remaining mandibular molars. No dental abscess or facial inflammation   Patient started on sepsis fluids, vancomycin and cefepime as well as morphine and Zofran and hospitalist consulted for admission.    Obj: 5/12 A/O x 4, negative CP, negative SOB, negative diaphoresis.   Objective: VITAL SIGNS: Temp: 98.5 F (36.9 C) (05/12 0730) Temp Source: Oral (05/12 0730) BP: 143/92 (05/12 0730) Pulse Rate: 82 (05/12 0730)   VENTILATOR SETTINGS: **  Procedures/Significant Events: 05/05/2023, CT abdomen pelvis with contrast showed left inguinal lymph node slightly decreased in size when compared to the prior PET scan.  No acute abnormality is identified to correspond with given clinical history.   CT maxillofacial fracture with contrast showed  poor dentition with multiple periapical lucencies and dental caries including of the most posterior to remaining mandibular molars.  No dental abscess or facial inflammation.   Consultants:  oncology Dr.Zhou Yu   Cultures 5/11 SARS coronavirus negative 5/12 blood Port-A-Cath NGTD  5/12 blood RIGHT arm NGTD   Antimicrobials: Anti-infectives (From admission, onward)    Start     Dose/Rate Route Frequency Ordered Stop   05/06/23 1300  vancomycin (VANCOREADY) IVPB 1500 mg/300 mL  Status:  Discontinued        1,500 mg 150 mL/hr over 120 Minutes Intravenous Every 12 hours 05/06/23 0321 05/06/23 0325   05/06/23 1300  vancomycin (VANCOREADY) IVPB 1500 mg/300 mL        1,500 mg 150 mL/hr over 120 Minutes Intravenous Every 12 hours 05/06/23 0325 05/13/23 1259   05/06/23 1000  acyclovir (ZOVIRAX) 200 MG capsule 400 mg        400 mg Oral 2 times daily 05/06/23 0516     05/06/23 0900  ceFEPIme (MAXIPIME) 2 g in sodium chloride 0.9 % 100 mL IVPB  Status:  Discontinued        2 g 200 mL/hr over 30 Minutes Intravenous Every 8 hours 05/06/23 0322 05/06/23 0325   05/06/23 0900  ceFEPIme (MAXIPIME) 2 g in sodium chloride 0.9 % 100 mL IVPB        2 g 200 mL/hr over 30 Minutes Intravenous Every 8 hours 05/06/23 0325 05/13/23 0859   05/06/23 0300  metroNIDAZOLE (FLAGYL) IVPB 500 mg        500 mg 100 mL/hr over 60 Minutes Intravenous Every 12 hours 05/06/23 0254 05/13/23 0259   05/06/23 0100  vancomycin (VANCOCIN) IVPB 1000 mg/200 mL premix       See Hyperspace for full Linked Orders Report.   1,000 mg 200 mL/hr over 60 Minutes Intravenous  Once 05/06/23 0046 05/06/23 0200   05/06/23 0100  vancomycin (VANCOREADY) IVPB 1250 mg/250 mL       See Hyperspace for full Linked Orders Report.   1,250 mg 166.7 mL/hr over 90 Minutes Intravenous  Once 05/06/23 0046 05/06/23 0514   05/06/23 0045  ceFEPIme (MAXIPIME) 2 g in sodium chloride 0.9 % 100 mL IVPB        2 g 200 mL/hr over 30 Minutes Intravenous   Once 05/06/23 0039 05/06/23 0142   05/06/23 0045  vancomycin (VANCOCIN) 2,250 mg in sodium chloride 0.9 % 500 mL IVPB  Status:  Discontinued        2,250 mg 261.3 mL/hr over 120 Minutes Intravenous  Once 05/06/23 0039 05/06/23 0045        Intake/Output Summary (Last 24 hours) at 05/06/2023 0912 Last data filed at 05/06/2023 8119 Gross per 24 hour  Intake 3448 ml  Output --  Net 3448 ml     Exam: Physical Exam:  General: A/O x 4, No acute respiratory distress Eyes: negative scleral hemorrhage, negative anisocoria, negative icterus ENT: Negative Runny nose, negative gingival bleeding, poor dentation Neck:  Negative scars, masses, torticollis, lymphadenopathy, JVD Lungs: Clear to auscultation bilaterally without wheezes or crackles Cardiovascular: Regular rate and rhythm without murmur gallop or rub normal S1 and S2 Abdomen: negative abdominal pain, nondistended, positive soft, bowel sounds, no rebound, no ascites, no appreciable mass Extremities: No significant cyanosis, clubbing, or edema bilateral lower extremities Skin: Negative rashes, lesions, ulcers Psychiatric:  Negative depression, negative anxiety, negative fatigue, negative mania  Central nervous system:  Cranial nerves II through XII intact, tongue/uvula midline, all extremities muscle strength 5/5, sensation intact throughout, negative dysarthria, negative expressive aphasia, negative receptive aphasia.   .   DVT prophylaxis:  Code Status:  Family Communication:  Status is: Inpatient    Dispo: The patient is from: Home              Anticipated d/c is to: Home              Anticipated d/c date is: > 3 days              Patient currently is not medically stable to d/c.      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)   Chemotherapy-induced nausea   Hypertension   Dental  caries   Obesity (BMI 30-39.9)   Severe sepsis ([name]) -Patient met criteria for severe sepsis on admission RR 22, WBC 55,000,Lactic Acid 2.2 -LR 119ml/hr - Continue current antibiotics - Consulted oncology Dr.Zhou Cathie Hoops will await further recommendations  Dental caries with dental pain vs ostealgia/Possible pegfilgrastin adverse effect (ostealgia and leukocytosis of 55,000) Sepsis criteria include tachycardia, leukocytosis with lactic acidosis and jaw/dental pain without other evident septic source and immunosuppressed patient Maxillofacial CT with contrast showing dental caries without dental abscess or facial inflammation  Marked leukocytosis might be related to GSF pegfilgrastim administered by oncology on 5/10 Per UpToDate:  Leukocytosis (WBC ?100,000/mm3) has been reported in patients receiving pegfilgrastim products/ >10%: Neuromuscular & skeletal: Ostealgia (31%)  Sepsis fluids Broad-spectrum antibiotics with cefepime vancomycin and Flagyl Follow blood cultures Consider oncology consult   Hypertension Continue home  meds or formulary substitution   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 Rickard Patience,  Oncology Patient received second chemotherapy infusion on 5/10 (Brentuximab + Cyclophosphamide + Doxorubicin + Prednisone) q21 Days x 6 Cycles  Day 2, Cycle 2 as well as Pegfilgrastim Symptomatic control  Hypokalemia -Potassium goal>4 -5/12 potassium IV 50 mEq  Obesity BMI 32.98 kg/m       Mobility Assessment (last 72 hours)     Mobility Assessment   No documentation.                Time: 50 minutes         Care during the described time interval was provided by me .  I have reviewed this patient's available data, including medical history, events of note, physical examination, and all test results as part of my evaluation.

## 2023-05-06 NOTE — Assessment & Plan Note (Signed)
Continue home meds or formulary substitution

## 2023-05-06 NOTE — Assessment & Plan Note (Addendum)
Dental caries with dental pain vs ostealgia Possible pegfilgrastin adverse effect (ostealgia and leukocytosis of 55,000) Sepsis criteria include tachycardia, leukocytosis with lactic acidosis and jaw/dental pain without other evident septic source and immunosuppressed patient Maxillofacial CT with contrast showing dental caries without dental abscess or facial inflammation  Marked leukocytosis might be related to GSF pegfilgrastim administered by oncology on 5/10 Per UpToDate:  Leukocytosis (WBC ?100,000/mm3) has been reported in patients receiving pegfilgrastim products/ >10%: Neuromuscular & skeletal: Ostealgia (31%)  Sepsis fluids Broad-spectrum antibiotics with cefepime vancomycin and Flagyl Follow blood cultures Consider oncology consult

## 2023-05-06 NOTE — Progress Notes (Signed)
Anticoagulation monitoring(Lovenox):  42 yo male ordered Lovenox 40 mg Q24h    Filed Weights   05/05/23 2255  Weight: 113.4 kg (250 lb)   BMI 33    Lab Results  Component Value Date   CREATININE 0.96 05/05/2023   CREATININE 1.22 05/03/2023   CREATININE 1.02 04/19/2023   Estimated Creatinine Clearance: 136.3 mL/min (by C-G formula based on SCr of 0.96 mg/dL). Hemoglobin & Hematocrit     Component Value Date/Time   HGB 14.0 05/05/2023 2307   HGB 13.2 05/03/2023 0845   HCT 42.1 05/05/2023 2307     Per Protocol for Patient with estCrcl > 30 ml/min and BMI > 30, will transition to Lovenox 57.5 mg Q24h.

## 2023-05-06 NOTE — Assessment & Plan Note (Addendum)
Chemotherapy-induced nausea Followed by Dr. Cathie Hoops Patient received second chemotherapy infusion on 5/10 (Brentuximab + Cyclophosphamide + Doxorubicin + Prednisone) q21 Days x 6 Cycles  Day 2, Cycle 2 as well as Pegfilgrastim Symptomatic control

## 2023-05-06 NOTE — Progress Notes (Signed)
Pharmacy Antibiotic Note  Randy [name]. is a 42 y.o. male admitted on 05/05/2023 with sepsis.  Pharmacy has been consulted for Vancomycin, Cefepime  dosing.  Plan: Cefepime 2 gm IV X 1 given in ED on 5/12 @ 0114. Cefepime 2 gm IV Q8H ordered to start on 5/12 @ 0900.   Vancomycin 1 gm IV X 1 given in ED on 5/12 @ 0120 followed by Vanc 1250 mg IV X 1 to make total loading dose of 2250 mg.  Vancomycin 1500 mg IV Q12H ordered to start on 5/12 @ 1300.  AUC = 522.3 Vanc trough = 13  Height: 6\' 1"  (185.4 cm) Weight: 113.4 kg (250 lb) IBW/kg (Calculated) : 79.9  Temp (24hrs), Avg:98.2 F (36.8 C), Min:98.1 F (36.7 C), Max:98.3 F (36.8 C)  Recent Labs  Lab 05/03/23 0845 05/05/23 2307 05/06/23 0030  WBC 7.4 55.6*  --   CREATININE 1.22 0.96  --   LATICACIDVEN  --   --  2.3*    Estimated Creatinine Clearance: 136.3 mL/min (by C-G formula based on SCr of 0.96 mg/dL).    No Known Allergies  Antimicrobials this admission:   >>    >>   Dose adjustments this admission:   Microbiology results:  BCx:   UCx:    Sputum:    MRSA PCR:   Thank you for allowing pharmacy to be a part of this patient's care.  Jerek Meulemans D 05/06/2023 3:22 AM

## 2023-05-06 NOTE — Progress Notes (Signed)
CODE SEPSIS - PHARMACY COMMUNICATION  **Broad Spectrum Antibiotics should be administered within 1 hour of Sepsis diagnosis**  Time Code Sepsis Called/Page Received: 5/12  @ 0154  Antibiotics Ordered: Vanc, Cefepime   Time of 1st antibiotic administration: Cefepime 2 gm IV X 1 on 5/12 @ 0115  Additional action taken by pharmacy: none  If necessary, Name of Provider/Nurse Contacted:     Deanie Jupiter D ,PharmD Clinical Pharmacist  05/06/2023  2:21 AM

## 2023-05-06 NOTE — ED Notes (Addendum)
Critical lab reported attending messaged. Dr Larinda Buttery also informed

## 2023-05-06 NOTE — Consult Note (Addendum)
Hematology/Oncology Consult note [phone] [phone]      Patient Care Team: Patient, No Pcp Per as PCP - General (General Practice)   Name of the patient: Randy [name]  [id number]  10-07-1981   REASON FOR COSULTATION:   History of presenting illness-  42 y.o. male with PMH listed at below who presents to ER for evaluation of nausea vomiting and chills, right dental pain. 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  Last chemotherapy was on 05/03/2023.  He received long-acting G-CSF on 05/04/2023.  He is also on prednisone 100mg  D2- 5  He has had right side teeth pain for a while which got a lot worse after recent chemotherapy. Nausea vomiting has resolved since admission..  Denies fever, abdominal pain, dysuria, headache.  Lab showed extreme leukocytosis of 55.6, platelet count 438. Lactic acid 2.3   05/05/2023, CT abdomen pelvis with contrast showed left inguinal lymph node slightly decreased in size when compared to the prior PET scan.  No acute abnormality is identified to correspond with given clinical history.  CT maxillofacial fracture with contrast showed poor dentition with multiple periapical lucencies and dental caries including of the most posterior to remaining mandibular molars.  No dental abscess or facial inflammation.  Oncology was consulted for further management.  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) 02/13/2023    Priority: High   Chemotherapy-induced nausea 04/19/2023    Priority: Medium    Encounter for antineoplastic chemotherapy 04/12/2023    Priority: Medium    Goals of care, counseling/discussion 04/02/2023    Priority: Low   Sepsis ([name]) 05/06/2023   Dental caries 05/06/2023   Obesity (BMI 30-39.9) 05/06/2023   Hypertension  05/04/2023   Psoriasis 05/04/2023   Healthcare maintenance 02/13/2023   Recurrent boils 07/21/2014   Thrush 07/21/2014   Hypokalemia 01/07/2014   Meningitis 01/06/2014   Rash and nonspecific skin eruption 01/06/2014     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 ([name])    Migraines    Seizures ([name])      Past Surgical History:  Procedure Laterality Date   INGUINAL LYMPH NODE BIOPSY Left 03/12/2023   Procedure: INGUINAL LYMPH NODE BIOPSY excisional;  Surgeon: Carolan Shiver, MD;  Location: [facility] ORS;  Service: General;  Laterality: Left;   IR BONE MARROW BIOPSY & ASPIRATION  03/21/2023   PORTACATH PLACEMENT N/A 04/11/2023   Procedure: INSERTION PORT-A-CATH;  Surgeon: Carolan Shiver, MD;  Location: [facility] ORS;  Service: General;  Laterality: N/A;    Social History   Socioeconomic History   Marital status: Married    Spouse name: Not on file   Number of children: Not on file   Years of education: Not on file   Highest education level: Not on file  Occupational History   Not on file  Tobacco Use   Smoking status: Former    Years: 15    Types: Cigarettes    Quit date: 03/25/2022    Years since quitting: 1.1   Smokeless tobacco: Not on file  Vaping Use   Vaping Use: Some days   Start date: 03/27/2023  Substance and Sexual Activity   Alcohol use: Yes    Comment: occasionaly 1-2 a year   Drug use: Yes  Frequency: 5.0 times per week    Types: Marijuana    Comment: smoke 4-5 times a week.   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 He is a Curator.    Social Determinants of Health   Financial Resource Strain: Low Risk  (02/13/2023)   Overall Financial Resource Strain (CARDIA)    Difficulty of Paying Living Expenses: Not hard at all  Food Insecurity: No Food Insecurity (02/13/2023)    Hunger Vital Sign    Worried About Running Out of Food in the Last Year: Never true    Ran Out of Food in the Last Year: Never true  Transportation Needs: No Transportation Needs (02/13/2023)   PRAPARE - Administrator, Civil Service (Medical): No    Lack of Transportation (Non-Medical): No  Physical Activity: Not on file  Stress: No Stress Concern Present (02/13/2023)   [employer] of Occupational Health - Occupational Stress Questionnaire    Feeling of Stress : Not at all  Social Connections: Not on file  Intimate Partner Violence: Not At Risk (02/13/2023)   Humiliation, Afraid, Rape, and Kick questionnaire    Fear of Current or Ex-Partner: No    Emotionally Abused: No    Physically Abused: No    Sexually Abused: No     Family History  Problem Relation Age of Onset   Stroke Father    Diabetes Father    Cancer Paternal Grandmother        breat cancer     Current Facility-Administered Medications:    acetaminophen (TYLENOL) tablet 650 mg, 650 mg, Oral, Q6H PRN **OR** acetaminophen (TYLENOL) suppository 650 mg, 650 mg, Rectal, Q6H PRN, Andris Baumann, MD   acyclovir (ZOVIRAX) 200 MG capsule 400 mg, 400 mg, Oral, BID, Lindajo Royal V, MD, 400 mg at 05/06/23 0946   ceFEPIme (MAXIPIME) 2 g in sodium chloride 0.9 % 100 mL IVPB, 2 g, Intravenous, Q8H, Andris Baumann, MD, Stopped at 05/06/23 0940   chlorhexidine (PERIDEX) 0.12 % solution 15 mL, 15 mL, Mouth/Throat, BID, Lindajo Royal V, MD, 15 mL at 05/06/23 1048   enoxaparin (LOVENOX) injection 57.5 mg, 0.5 mg/kg, Subcutaneous, Q24H, Lindajo Royal V, MD, 57.5 mg at 05/06/23 0907   irbesartan (AVAPRO) tablet 75 mg, 75 mg, Oral, Daily, 75 mg at 05/06/23 0945 **AND** hydrochlorothiazide (HYDRODIURIL) tablet 12.5 mg, 12.5 mg, Oral, Daily, Lindajo Royal V, MD, 12.5 mg at 05/06/23 0943   HYDROcodone-acetaminophen (NORCO/VICODIN) 5-325 MG per tablet 1-2 tablet, 1-2 tablet, Oral, Q4H PRN, Andris Baumann, MD   ibuprofen (ADVIL)  tablet 400 mg, 400 mg, Oral, Q6H PRN, Andris Baumann, MD   ketorolac (TORADOL) 30 MG/ML injection 30 mg, 30 mg, Intravenous, Q6H PRN, Andris Baumann, MD   lactated ringers infusion, , Intravenous, Continuous, Delton Prairie, MD, Stopped at 05/06/23 0909   lactated ringers infusion, 150 mL/hr, Intravenous, Continuous, Andris Baumann, MD, Stopped at 05/06/23 0909   metroNIDAZOLE (FLAGYL) IVPB 500 mg, 500 mg, Intravenous, Q12H, Andris Baumann, MD, Stopped at 05/06/23 0513   morphine (PF) 2 MG/ML injection 2 mg, 2 mg, Intravenous, Q2H PRN, Andris Baumann, MD   ondansetron (ZOFRAN) tablet 4 mg, 4 mg, Oral, Q6H PRN **OR** ondansetron (ZOFRAN) injection 4 mg, 4 mg, Intravenous, Q6H PRN, Lindajo Royal V, MD   potassium chloride 10 mEq in 100 mL IVPB, 10 mEq, Intravenous, Q1  Hr x 5, Drema Dallas, MD, Last Rate: 100 mL/hr at 05/06/23 1058, 10 mEq at 05/06/23 1058   prochlorperazine (COMPAZINE) tablet 10 mg, 10 mg, Oral, Q6H PRN, Andris Baumann, MD   vancomycin (VANCOREADY) IVPB 1500 mg/300 mL, 1,500 mg, Intravenous, Q12H, Andris Baumann, MD  Current Outpatient Medications:    acyclovir (ZOVIRAX) 400 MG tablet, Take 1 tablet (400 mg total) by mouth 2 (two) times daily., Disp: 60 tablet, Rfl: 1   chlorhexidine (PERIDEX) 0.12 % solution, Use as directed 15 mLs in the mouth or throat 2 (two) times daily., Disp: 473 mL, Rfl: 3   ibuprofen (ADVIL) 200 MG tablet, Take 400 mg by mouth every 6 (six) hours as needed for moderate pain., Disp: , Rfl:    lidocaine-prilocaine (EMLA) cream, Apply to affected area once, Disp: 30 g, Rfl: 3   ondansetron (ZOFRAN) 8 MG tablet, Take 1 tablet (8 mg total) by mouth every 8 (eight) hours as needed for nausea or vomiting. Start on the third day after cyclophosphamide chemotherapy., Disp: 30 tablet, Rfl: 1   predniSONE (DELTASONE) 20 MG tablet, Take 5 tablets (100 mg total) by mouth daily. Take on days 2-5 of chemotherapy., Disp: 20 tablet, Rfl: 7   prochlorperazine  (COMPAZINE) 10 MG tablet, Take 1 tablet (10 mg total) by mouth every 6 (six) hours as needed for nausea or vomiting., Disp: 30 tablet, Rfl: 6   traMADol (ULTRAM) 50 MG tablet, Take 1 tablet (50 mg total) by mouth every 6 (six) hours as needed. (Patient taking differently: Take 50 mg by mouth every 6 (six) hours as needed for moderate pain.), Disp: 10 tablet, Rfl: 0   valsartan-hydrochlorothiazide (DIOVAN-HCT) 80-12.5 MG tablet, Take 1 tablet by mouth daily., Disp: 90 tablet, Rfl: 1  Review of Systems  Constitutional:  Positive for fatigue. Negative for appetite change, chills, fever and unexpected weight change.  HENT:   Negative for hearing loss and voice change.        Tooth pain  Eyes:  Negative for eye problems and icterus.  Respiratory:  Negative for chest tightness, cough and shortness of breath.   Cardiovascular:  Negative for chest pain and leg swelling.  Gastrointestinal:  Negative for abdominal distention and abdominal pain.  Endocrine: Negative for hot flashes.  Genitourinary:  Negative for difficulty urinating, dysuria and frequency.   Musculoskeletal:  Negative for arthralgias.  Skin:  Negative for itching and rash.  Neurological:  Negative for light-headedness and numbness.  Hematological:  Negative for adenopathy. Does not bruise/bleed easily.  Psychiatric/Behavioral:  Negative for confusion.     PHYSICAL EXAM Vitals:   05/06/23 0645 05/06/23 0730 05/06/23 1124 05/06/23 1128  BP:  (!) 143/92  (!) 140/100  Pulse: 79 82  84  Resp:  20 20 20   Temp:  98.5 F (36.9 C) 98 F (36.7 C)   TempSrc:  Oral    SpO2: 97% 96%  98%  Weight:      Height:       Physical Exam Constitutional:      General: He is not in acute distress.    Appearance: He is obese. He is not diaphoretic.  HENT:     Head: Normocephalic and atraumatic.     Nose: Nose normal.     Mouth/Throat:     Pharynx: No oropharyngeal exudate.  Eyes:     General: No scleral icterus.    Pupils: Pupils are  equal, round, and reactive to light.  Cardiovascular:  Rate and Rhythm: Normal rate and regular rhythm.     Heart sounds: No murmur heard. Pulmonary:     Effort: Pulmonary effort is normal. No respiratory distress.     Breath sounds: No rales.  Chest:     Chest wall: No tenderness.  Abdominal:     General: There is no distension.     Palpations: Abdomen is soft.     Tenderness: There is no abdominal tenderness.  Musculoskeletal:        General: Normal range of motion.     Cervical back: Normal range of motion and neck supple.  Skin:    General: Skin is warm and dry.     Findings: No erythema.  Neurological:     Mental Status: He is alert and oriented to person, place, and time.     Cranial Nerves: No cranial nerve deficit.     Motor: No abnormal muscle tone.     Coordination: Coordination normal.  Psychiatric:        Mood and Affect: Affect normal.       LABORATORY STUDIES    Latest Ref Rng & Units 05/06/2023    4:30 AM 05/05/2023   11:07 PM 05/03/2023    8:45 AM  CBC  WBC 4.0 - 10.5 K/uL 53.6  55.6  7.4   Hemoglobin 13.0 - 17.0 g/dL 69.6  29.5  28.4   Hematocrit 39.0 - 52.0 % 40.5  42.1  39.4   Platelets 150 - 400 K/uL 403  438  406       Latest Ref Rng & Units 05/05/2023   11:07 PM 05/03/2023    8:45 AM 04/19/2023   10:52 AM  CMP  Glucose 70 - 99 mg/dL 132  440  98   BUN 6 - 20 mg/dL 16  18  12    Creatinine 0.61 - 1.24 mg/dL 1.02  7.25  3.66   Sodium 135 - 145 mmol/L 137  139  135   Potassium 3.5 - 5.1 mmol/L 3.2  3.6  3.5   Chloride 98 - 111 mmol/L 102  107  101   CO2 22 - 32 mmol/L 24  24  26    Calcium 8.9 - 10.3 mg/dL 9.1  9.0  8.9   Total Protein 6.5 - 8.1 g/dL 7.1  6.7  6.7   Total Bilirubin 0.3 - 1.2 mg/dL 0.7  0.4  0.4   Alkaline Phos 38 - 126 U/L 120  89  129   AST 15 - 41 U/L 37  27  27   ALT 0 - 44 U/L 39  40  27      RADIOGRAPHIC STUDIES: I have personally reviewed the radiological images as listed and agreed with the findings in the  report. CT ABDOMEN PELVIS W CONTRAST  Result Date: 05/06/2023 CLINICAL DATA:  History of lymphoma with recent chemotherapy and increasing nausea and vomiting, initial encounter EXAM: CT ABDOMEN AND PELVIS WITH CONTRAST TECHNIQUE: Multidetector CT imaging of the abdomen and pelvis was performed using the standard protocol following bolus administration of intravenous contrast. RADIATION DOSE REDUCTION: This exam was performed according to the departmental dose-optimization program which includes automated exposure control, adjustment of the mA and/or kV according to patient size and/or use of iterative reconstruction technique. CONTRAST:  OMNIPAQUE IOHEXOL 300 MG/ML  SOLN COMPARISON:  03/14/2023 PET-CT FINDINGS: Lower chest: No acute abnormality. Hepatobiliary: No focal liver abnormality is seen. No gallstones, gallbladder wall thickening, or biliary dilatation. Pancreas: Unremarkable. No pancreatic ductal  dilatation or surrounding inflammatory changes. Spleen: Normal in size without focal abnormality. Adrenals/Urinary Tract: Adrenal glands are within normal limits. Kidneys are well visualized within normal enhancement pattern. Small stable cyst is noted in the left kidney. No follow-up is recommended. No obstructive changes are seen. The bladder is partially distended. Stomach/Bowel: The appendix is well visualized and within normal limits. No obstructive or inflammatory changes of the colon are seen. Stomach and small bowel are within normal limits. Vascular/Lymphatic: Atherosclerotic changes are noted. Left inguinal lymph node is noted measuring 2.1 x 1.7 cm. This is decreased in the interval from the prior PET-CT at which time it measured up to 2.8 cm. Reproductive: Prostate is unremarkable. Other: Stable fat containing left inguinal hernia is noted. No abdominopelvic ascites. Musculoskeletal: No acute or significant osseous findings. IMPRESSION: Left inguinal lymph node slightly decreased in size when  compared with the prior PET-CT. No acute abnormality is identified to correspond with the given clinical history. Electronically Signed   By: Alcide Clever M.D.   On: 05/06/2023 01:17   CT Maxillofacial W Contrast  Result Date: 05/06/2023 CLINICAL DATA:  Right mandibular molar fracture EXAM: CT MAXILLOFACIAL WITH CONTRAST TECHNIQUE: Multidetector CT imaging of the maxillofacial structures was performed with intravenous contrast. Multiplanar CT image reconstructions were also generated. RADIATION DOSE REDUCTION: This exam was performed according to the departmental dose-optimization program which includes automated exposure control, adjustment of the mA and/or kV according to patient size and/or use of iterative reconstruction technique. CONTRAST:  OMNIPAQUE IOHEXOL 300 MG/ML  SOLN COMPARISON:  None Available. FINDINGS: Osseous: Poor dentition with multiple periapical lucencies. There are caries of the most posterior 2 remaining mandibular molars. Periapical lucency at the root of tooth 8 extends to the incisive foramen. No dental abscess or facial inflammation. Orbits: Negative. No traumatic or inflammatory finding. Sinuses: Clear. Soft tissues: Negative. Limited intracranial: No significant or unexpected finding. IMPRESSION: Poor dentition with multiple periapical lucencies and dental caries, including of the most posterior 2 remaining mandibular molars. No dental abscess or facial inflammation. Electronically Signed   By: Deatra Robinson M.D.   On: 05/06/2023 01:02   DG Chest 2 View  Result Date: 05/06/2023 CLINICAL DATA:  Nausea and vomiting. EXAM: CHEST - 2 VIEW COMPARISON:  April 11, 2023 FINDINGS: There is stable right-sided venous Port-A-Cath positioning. The heart size and mediastinal contours are within normal limits. Both lungs are clear. The visualized skeletal structures are unremarkable. IMPRESSION: No active cardiopulmonary disease. Electronically Signed   By: Aram Candela M.D.   On:  05/06/2023 00:14   DG Chest Port 1 View  Result Date: 04/11/2023 CLINICAL DATA:  Port-A-Cath placement. EXAM: PORTABLE CHEST 1 VIEW COMPARISON:  Chest radiographs 04/20/2017.  PET-03/14/2023. FINDINGS: The cardiomediastinal silhouette is accentuated by portable AP technique and low lung volumes. A right jugular Port-A-Cath has been placed and terminates near the superior cavoatrial junction. External artifact is noted over the right lung base and lower mediastinum. Within this limitation, no definite airspace consolidation, overt pulmonary edema, sizable pleural effusion, or pneumothorax is identified. No acute osseous abnormality is seen. IMPRESSION: Right jugular Port-A-Cath as above. Low lung volumes. Electronically Signed   By: Sebastian Ache M.D.   On: 04/11/2023 11:03   DG C-Arm 1-60 Min-No Report  Result Date: 04/11/2023 Fluoroscopy was utilized by the requesting physician.  No radiographic interpretation.   ECHOCARDIOGRAM COMPLETE  Result Date: 04/03/2023    ECHOCARDIOGRAM REPORT   Patient Name:   Arnis Nakanishi. Date of Exam: 04/03/2023  Medical Rec #:  [id number]          Height:       73.0 in Accession #:    [id number]         Weight:       249.3 lb Date of Birth:  1981-07-29         BSA:          2.363 m Patient Age:    41 years           BP:           143/106 mmHg Patient Gender: M                  HR:           72 bpm. Exam Location:  [facility] Procedure: 2D Echo, Cardiac Doppler and Color Doppler Indications:     Chemo  History:         Patient has no prior history of Echocardiogram examinations.                  Lymphoma.  Sonographer:     Mikki Harbor Referring Phys:  [id number] Zacchaeus Halm Diagnosing Phys: Yvonne Kendall MD  Sonographer Comments: Global longitudinal strain was attempted. IMPRESSIONS  1. Left ventricular ejection fraction, by estimation, is 60 to 65%. The left ventricle has normal function. The left ventricle has no regional wall motion abnormalities. Left ventricular diastolic  parameters were normal. The average left ventricular global longitudinal strain is -21.8 %. The global longitudinal strain is normal.  2. Right ventricular systolic function is normal. The right ventricular size is normal.  3. The mitral valve is normal in structure. Trivial mitral valve regurgitation.  4. The aortic valve is tricuspid. Aortic valve regurgitation is not visualized. No aortic stenosis is present.  5. The inferior vena cava is dilated in size with >50% respiratory variability, suggesting right atrial pressure of 8 mmHg. FINDINGS  Left Ventricle: Left ventricular ejection fraction, by estimation, is 60 to 65%. The left ventricle has normal function. The left ventricle has no regional wall motion abnormalities. The average left ventricular global longitudinal strain is -21.8 %. The global longitudinal strain is normal. The left ventricular internal cavity size was normal in size. There is borderline left ventricular hypertrophy. Left ventricular diastolic parameters were normal. Right Ventricle: The right ventricular size is normal. No increase in right ventricular wall thickness. Right ventricular systolic function is normal. Left Atrium: Left atrial size was normal in size. Right Atrium: Right atrial size was normal in size. Pericardium: There is no evidence of pericardial effusion. Mitral Valve: The mitral valve is normal in structure. Trivial mitral valve regurgitation. MV peak gradient, 3.5 mmHg. The mean mitral valve gradient is 2.0 mmHg. Tricuspid Valve: The tricuspid valve is normal in structure. Tricuspid valve regurgitation is trivial. Aortic Valve: The aortic valve is tricuspid. Aortic valve regurgitation is not visualized. No aortic stenosis is present. Aortic valve mean gradient measures 4.0 mmHg. Aortic valve peak gradient measures 7.0 mmHg. Aortic valve area, by VTI measures 2.67 cm. Pulmonic Valve: The pulmonic valve was normal in structure. Pulmonic valve regurgitation is trivial. No  evidence of pulmonic stenosis. Aorta: The aortic root is normal in size and structure. Pulmonary Artery: The pulmonary artery is of normal size. Venous: The inferior vena cava is dilated in size with greater than 50% respiratory variability, suggesting right atrial pressure of 8 mmHg. IAS/Shunts: No atrial level shunt detected by color flow Doppler.  LEFT VENTRICLE  PLAX 2D LVIDd:         4.80 cm   Diastology LVIDs:         3.20 cm   LV e' medial:    9.46 cm/s LV PW:         1.19 cm   LV E/e' medial:  8.4 LV IVS:        1.10 cm   LV e' lateral:   11.70 cm/s LVOT diam:     2.20 cm   LV E/e' lateral: 6.8 LV SV:         84 LV SV Index:   36        2D Longitudinal Strain LVOT Area:     3.80 cm  2D Strain GLS Avg:     -21.8 %  RIGHT VENTRICLE RV Basal diam:  3.70 cm RV Mid diam:    3.80 cm RV S prime:     13.40 cm/s TAPSE (M-mode): 2.5 cm LEFT ATRIUM             Index        RIGHT ATRIUM           Index LA diam:        3.90 cm 1.65 cm/m   RA Area:     15.60 cm LA Vol (A2C):   65.9 ml 27.89 ml/m  RA Volume:   34.40 ml  14.56 ml/m LA Vol (A4C):   44.1 ml 18.66 ml/m LA Biplane Vol: 57.8 ml 24.46 ml/m  AORTIC VALVE                    PULMONIC VALVE AV Area (Vmax):    3.28 cm     PV Vmax:       0.93 m/s AV Area (Vmean):   2.95 cm     PV Peak grad:  3.4 mmHg AV Area (VTI):     2.67 cm AV Vmax:           132.00 cm/s AV Vmean:          92.300 cm/s AV VTI:            0.315 m AV Peak Grad:      7.0 mmHg AV Mean Grad:      4.0 mmHg LVOT Vmax:         114.00 cm/s LVOT Vmean:        71.600 cm/s LVOT VTI:          0.221 m LVOT/AV VTI ratio: 0.70  AORTA Ao Root diam: 3.40 cm MITRAL VALVE MV Area (PHT): 3.81 cm    SHUNTS MV Area VTI:   2.79 cm    Systemic VTI:  0.22 m MV Peak grad:  3.5 mmHg    Systemic Diam: 2.20 cm MV Mean grad:  2.0 mmHg MV Vmax:       0.93 m/s MV Vmean:      60.0 cm/s MV Decel Time: 199 msec MV E velocity: 79.40 cm/s MV A velocity: 61.40 cm/s MV E/A ratio:  1.29 Cristal De[place] MD Electronically signed by  Yvonne Kendall MD Signature Date/Time: 04/03/2023/5:58:15 PM    Final    IR BONE MARROW BIOPSY & ASPIRATION  Result Date: 03/21/2023 INDICATION: lymphadenopathy EXAM: IR BONE MARROW BIOPSY AND ASPIRATION MEDICATIONS: None. ANESTHESIA/SEDATION: Moderate (conscious) sedation was employed during this procedure. A total of Versed 2 mg and Fentanyl 100 mcg was administered intravenously. Moderate Sedation Time: 10 minutes. The patient's level of consciousness and vital  signs were monitored continuously by radiology nursing throughout the procedure under my direct supervision. FLUOROSCOPY TIME:  Fluoroscopy Time: 0.6 minutes (10 mGy) COMPLICATIONS: None immediate. PROCEDURE: Informed written consent was obtained from the patient after a thorough discussion of the procedural risks, benefits and alternatives. All questions were addressed. Maximal Sterile Barrier Technique was utilized including caps, mask, sterile gowns, sterile gloves, sterile drape, hand hygiene and skin antiseptic. A timeout was performed prior to the initiation of the procedure. The patient was placed prone on the IR exam table. Limited fluoroscopy of the pelvis was performed for planning purposes. Skin entry site was marked, and the overlying skin was prepped and draped in the standard sterile fashion. Local analgesia was obtained with 1% lidocaine. Using fluoroscopic guidance, an 11 gauge needle was advanced just deep to the cortex of the right posterior ilium. Subsequently, bone marrow aspiration and core biopsy were performed. Dry tap was noted. An additional core was obtained. Specimens were submitted to lab/pathology for handling. Hemostasis was achieved with manual pressure, and a clean dressing was placed. The patient tolerated the procedure well without immediate complication. IMPRESSION: Successful bone marrow core biopsy using fluoroscopic guidance. Attempts at bone marrow aspiration yielded a dry tap. Electronically Signed   By: Olive Bass M.D.   On: 03/21/2023 11:23   NM PET Image Initial (PI) Skull Base To Thigh  Result Date: 03/15/2023 CLINICAL DATA:  Initial treatment strategy for lymphoma. EXAM: NUCLEAR MEDICINE PET SKULL BASE TO THIGH TECHNIQUE: 13.14 mCi F-18 FDG was injected intravenously. Full-ring PET imaging was performed from the skull base to thigh after the radiotracer. CT data was obtained and used for attenuation correction and anatomic localization. Fasting blood glucose: 101 mg/dl COMPARISON:  CT abdomen pelvis February 09, 2023 FINDINGS: Mediastinal blood pool activity: SUV max 1.9 Liver activity: SUV max 2.5 NECK: Symmetric hypermetabolic hyperplasia of the tonsils with a max SUV of 5.8 is commonly reactive. Hypermetabolic sublingual activity likely reflects phonation. No hypermetabolic cervical adenopathy. Incidental CT findings: None. CHEST: No hypermetabolic thoracic adenopathy. No hypermetabolic pulmonary nodules or masses. Incidental CT findings: None. ABDOMEN/PELVIS: Hypermetabolic left inguinal and left external iliac lymph nodes. For reference: -hypermetabolic left inguinal lymph node measures 2.8 x 2.7 cm on image 157/4 with a max SUV of 17.7 -hypermetabolic left external iliac lymph node 4 mm in short axis on image 147/4 with a max SUV of 3.4. No hypermetabolic abdominal adenopathy. No abnormal hypermetabolic activity within the liver, pancreas, adrenal glands, or spleen. Incidental CT findings: No splenomegaly. Aortic atherosclerosis. Tiny fat containing left inguinal hernia. SKELETON: Diffuse low-level FDG avidity instance max SUV in the L5 vertebral body of 2.9. Incidental CT findings: None. IMPRESSION: 1. Hypermetabolic left inguinal and left external iliac lymph nodes consistent with lymphomatous disease involvement. 2. Diffuse low-level FDG avidity throughout the axial and appendicular skeleton is nonspecific likely physiologic less likely reflecting lymphomatous involvement. 3. No splenomegaly. 4.  Symmetric hypermetabolic hyperplasia of the tonsils is commonly reactive. 5. Tiny fat containing left inguinal hernia. 6. Aortic Atherosclerosis (ICD10-I70.0). Electronically Signed   By: Maudry Mayhew M.D.   On: 03/15/2023 14:15   [place] CORE BIOPSY (LYMPH NODES)  Result Date: 02/21/2023 INDICATION: LEFT inguinal mass. EXAM: ULTRASOUND-GUIDED LEFT INGUINAL MASS BIOPSY COMPARISON:  CT AP, 02/09/2023. MEDICATIONS: None ANESTHESIA/SEDATION: Local anesthetic was administered. COMPLICATIONS: None immediate. TECHNIQUE: Informed written consent was obtained from the patient after a discussion of the risks, benefits and alternatives to treatment. Questions regarding the procedure were encouraged and answered. Initial ultrasound  scanning demonstrated large LEFT inguinal mass. An ultrasound image was saved for documentation purposes. The procedure was planned. A timeout was performed prior to the initiation of the procedure. The operative was prepped and draped in the usual sterile fashion, and a sterile drape was applied covering the operative field. A timeout was performed prior to the initiation of the procedure. Local anesthesia was provided with 1% lidocaine with epinephrine. Under direct ultrasound guidance, an 18 gauge core needle device was utilized to obtain to obtain 3 core needle biopsies of the LEFT inguinal mass. The samples were placed in saline and submitted to pathology. The needle was removed and superficial hemostasis was achieved with manual compression. Post procedure scan was negative for significant hematoma. A dressing was applied. The patient tolerated the procedure well without immediate postprocedural complication. IMPRESSION: Successful ultrasound guided biopsy of the large LEFT inguinal mass. Roanna Banning, MD [facility] Radiology Electronically Signed   By: Roanna Banning M.D.   On: 02/21/2023 15:35   CT ABDOMEN PELVIS W CONTRAST  Result Date:  02/09/2023 CLINICAL DATA:  Left sided inguinal pain, hernia suspected EXAM: CT ABDOMEN AND PELVIS WITH CONTRAST TECHNIQUE: Multidetector CT imaging of the abdomen and pelvis was performed using the standard protocol following bolus administration of intravenous contrast. RADIATION DOSE REDUCTION: This exam was performed according to the departmental dose-optimization program which includes automated exposure control, adjustment of the mA and/or kV according to patient size and/or use of iterative reconstruction technique. CONTRAST:  OMNIPAQUE IOHEXOL 300 MG/ML  SOLN COMPARISON:  None Available. FINDINGS: Lower chest: 5 mm mean diameter subpleural left lower lobe pulmonary nodule reference image 27/4. No acute pleural or parenchymal lung disease. Hepatobiliary: No focal liver abnormality is seen. No gallstones, gallbladder wall thickening, or biliary dilatation. Pancreas: Unremarkable. No pancreatic ductal dilatation or surrounding inflammatory changes. Spleen: Normal in size without focal abnormality. Adrenals/Urinary Tract: Adrenal glands are unremarkable. Kidneys are normal, without renal calculi, focal lesion, or hydronephrosis. Bladder is unremarkable. Stomach/Bowel: No bowel obstruction or ileus. Normal appendix right lower quadrant. No bowel wall thickening or inflammatory change. Vascular/Lymphatic: There are markedly enlarged left inguinal lymph nodes, largest measuring 6.1 x 5.5 x 4.4 cm. There is an adjacent necrotic lymph node measuring up to 3.2 cm in size. Borderline enlarged left external iliac chain lymph node measures 1.1 cm in short axis. No other pathologic adenopathy within the remainder of the abdomen or pelvis. Atherosclerosis of the distal abdominal aorta and bilateral iliac arteries without focal stenosis. Reproductive: Prostate is unremarkable. Other: No free fluid or free intraperitoneal gas. There is a small fat containing left inguinal hernia. No bowel herniation. Musculoskeletal: No  acute or destructive bony lesions. Reconstructed images demonstrate no additional findings. IMPRESSION: 1. Massive lymphadenopathy within the left inguinal region, likely accounting for the patient's clinical symptoms. These lymph nodes are amenable to percutaneous sampling. 2. Small fat containing left inguinal hernia. Given the small size this is unlikely to account for the patient's clinical symptoms, especially in light of the massive left inguinal adenopathy described above. 3. 5 mm left solid pulmonary nodule. Per Fleischner Society Guidelines, no routine follow-up imaging is recommended. These guidelines do not apply to immunocompromised patients and patients with cancer. Follow up in patients with significant comorbidities as clinically warranted. For lung cancer screening, adhere to Lung-RADS guidelines. Reference: Radiology. 2017; 284(1):228-43. 4.  Aortic Atherosclerosis (ICD10-I70.0). Electronically Signed   By: Sharlet Salina M.D.   On: 02/09/2023 19:47  Assessment and plan-   # Severe leukocytosis Likely combination of tooth infection and side effects from recent G-CSF injection as well as prednisone use.  UA is negative. Blood cultures pending. He is hemodynamically stable.   I agree with covering with broad-spectrum antibiotics with cefepime, vancomycin and Flagyl. 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  He is hemodynamically stable.  I will continue. Outpatient follow-up appointments.  # Nausea and vomiting are likely secondary to recent chemotherapy. Current antiemetics.  Symptom has improved.  Thank you for allowing me to participate in the care of this patient.   Rickard Patience, MD, PhD Hematology Oncology 05/06/2023

## 2023-05-07 DIAGNOSIS — E669 Obesity, unspecified: Secondary | ICD-10-CM | POA: Diagnosis not present

## 2023-05-07 DIAGNOSIS — I159 Secondary hypertension, unspecified: Secondary | ICD-10-CM

## 2023-05-07 DIAGNOSIS — C8475 Anaplastic large cell lymphoma, ALK-negative, lymph nodes of inguinal region and lower limb: Secondary | ICD-10-CM | POA: Diagnosis not present

## 2023-05-07 DIAGNOSIS — R110 Nausea: Secondary | ICD-10-CM | POA: Diagnosis not present

## 2023-05-07 DIAGNOSIS — T451X5A Adverse effect of antineoplastic and immunosuppressive drugs, initial encounter: Secondary | ICD-10-CM

## 2023-05-07 DIAGNOSIS — A419 Sepsis, unspecified organism: Secondary | ICD-10-CM | POA: Diagnosis not present

## 2023-05-07 LAB — CBC WITH DIFFERENTIAL/PLATELET
Abs Immature Granulocytes: 12.83 10*3/uL — ABNORMAL HIGH (ref 0.00–0.07)
Basophils Absolute: 0.1 10*3/uL (ref 0.0–0.1)
Basophils Relative: 0 %
Eosinophils Absolute: 0 10*3/uL (ref 0.0–0.5)
Eosinophils Relative: 0 %
HCT: 42.4 % (ref 39.0–52.0)
Hemoglobin: 14.3 g/dL (ref 13.0–17.0)
Immature Granulocytes: 28 %
Lymphocytes Relative: 3 %
Lymphs Abs: 1.5 10*3/uL (ref 0.7–4.0)
MCH: 28.4 pg (ref 26.0–34.0)
MCHC: 33.7 g/dL (ref 30.0–36.0)
MCV: 84.3 fL (ref 80.0–100.0)
Monocytes Absolute: 1.2 10*3/uL — ABNORMAL HIGH (ref 0.1–1.0)
Monocytes Relative: 3 %
Neutro Abs: 30.8 10*3/uL — ABNORMAL HIGH (ref 1.7–7.7)
Neutrophils Relative %: 66 %
Platelets: 367 10*3/uL (ref 150–400)
RBC: 5.03 MIL/uL (ref 4.22–5.81)
RDW: 15.5 % (ref 11.5–15.5)
Smear Review: NORMAL
WBC Morphology: INCREASED
WBC: 46.4 10*3/uL — ABNORMAL HIGH (ref 4.0–10.5)
nRBC: 0 % (ref 0.0–0.2)

## 2023-05-07 LAB — COMPREHENSIVE METABOLIC PANEL
ALT: 30 U/L (ref 0–44)
AST: 27 U/L (ref 15–41)
Albumin: 4 g/dL (ref 3.5–5.0)
Alkaline Phosphatase: 156 U/L — ABNORMAL HIGH (ref 38–126)
Anion gap: 9 (ref 5–15)
BUN: 16 mg/dL (ref 6–20)
CO2: 27 mmol/L (ref 22–32)
Calcium: 9.2 mg/dL (ref 8.9–10.3)
Chloride: 103 mmol/L (ref 98–111)
Creatinine, Ser: 0.92 mg/dL (ref 0.61–1.24)
GFR, Estimated: >60 mL/min (ref >60)
Glucose, Bld: 110 mg/dL — ABNORMAL HIGH (ref 70–99)
Potassium: 3.7 mmol/L (ref 3.5–5.1)
Sodium: 139 mmol/L (ref 135–145)
Total Bilirubin: 1 mg/dL (ref 0.3–1.2)
Total Protein: 7 g/dL (ref 6.5–8.1)

## 2023-05-07 LAB — PHOSPHORUS: Phosphorus: 4.7 mg/dL — ABNORMAL HIGH (ref 2.5–4.6)

## 2023-05-07 LAB — CULTURE, BLOOD (ROUTINE X 2): Culture: NO GROWTH

## 2023-05-07 LAB — MAGNESIUM: Magnesium: 2.3 mg/dL (ref 1.7–2.4)

## 2023-05-07 MED ORDER — METOPROLOL TARTRATE 25 MG PO TABS
12.5000 mg | ORAL_TABLET | Freq: Two times a day (BID) | ORAL | Status: DC
  Administered 2023-05-07 – 2023-05-08 (×3): 12.5 mg via ORAL
  Filled 2023-05-07 (×3): qty 1

## 2023-05-07 NOTE — Progress Notes (Signed)
Randy [name]. NWG:[id number] DOB: November 10, 1981 DOA: 05/05/2023 PCP: Patient, No Pcp Per   Subj: Randy [name]. is a 42 y.o. BM PMHx Seizures, Anaplastic large cell lymphoma he received his 2d round chemotherapy on 05/04/2023   Presents to the ED for evaluation of nausea vomiting and chills as well as right dental pain.  Patient had chemotherapy induced nausea after his first round but states that this feels different.  He denies abdominal pain, diarrhea or dysuria and he has no chest pain or shortness of breath.ED course and data review: Afebrile, pulse 95, BP 162/114 with respirations 22 and O2 sat 97% on room air WBC 55,000 up from 7000 on 5/10 lactic acid 2.3, procalcitonin pending. EKG, personally reviewed and interpreted showing NSR at 92 with nonspecific ST-T wave changes.   Chest x-ray with no active disease. CT abdomen and pelvis showing no acute abnormality.  Enlarged left inguinal node slightly decreased from prior Maxillofacial CT with contrast showing dental caries without dental abscess or facial inflammation as follows IMPRESSION: Poor dentition with multiple periapical lucencies and dental caries, including of the most posterior 2 remaining mandibular molars. No dental abscess or facial inflammation   Patient started on sepsis fluids, vancomycin and cefepime as well as morphine and Zofran and hospitalist consulted for admission.    Obj: 5/13 afebrile overnight, BP increasing overnight.  A/O x 4, negative CP, negative SOB.   Objective: VITAL SIGNS: Temp: 98.3 F (36.8 C) (05/13 0621) Temp Source: Oral (05/13 0621) BP: 158/102 (05/13 5784) Pulse Rate: 99 (05/12 2043)   VENTILATOR SETTINGS: **  Procedures/Significant Events: 05/05/2023, CT abdomen pelvis with contrast showed left inguinal lymph node slightly decreased in size when compared to the prior PET scan.  No acute abnormality is identified to correspond with given clinical history.   CT maxillofacial  fracture with contrast showed poor dentition with multiple periapical lucencies and dental caries including of the most posterior to remaining mandibular molars.  No dental abscess or facial inflammation.   Consultants:  oncology Dr.Zhou Yu   Cultures 5/11 SARS coronavirus negative 5/12 blood Port-A-Cath NGTD  5/12 blood RIGHT arm NGTD   Antimicrobials: Anti-infectives (From admission, onward)    Start     Ordered Stop   05/06/23 1300  vancomycin (VANCOREADY) IVPB 1500 mg/300 mL  Status:  Discontinued        05/06/23 0321 05/06/23 0325   05/06/23 1300  vancomycin (VANCOREADY) IVPB 1500 mg/300 mL        05/06/23 0325 05/13/23 1259   05/06/23 1000  acyclovir (ZOVIRAX) 200 MG capsule 400 mg        05/06/23 0516     05/06/23 0900  ceFEPIme (MAXIPIME) 2 g in sodium chloride 0.9 % 100 mL IVPB  Status:  Discontinued        05/06/23 0322 05/06/23 0325   05/06/23 0900  ceFEPIme (MAXIPIME) 2 g in sodium chloride 0.9 % 100 mL IVPB        05/06/23 0325 05/13/23 0859   05/06/23 0300  metroNIDAZOLE (FLAGYL) IVPB 500 mg        05/06/23 0254 05/13/23 0259   05/06/23 0100  vancomycin (VANCOCIN) IVPB 1000 mg/200 mL premix       See Hyperspace for full Linked Orders Report.   05/06/23 0046 05/06/23 0200   05/06/23 0100  vancomycin (VANCOREADY) IVPB 1250 mg/250 mL       See Hyperspace for full Linked Orders Report.   05/06/23 6962 05/06/23 9528   05/06/23 0045  ceFEPIme (MAXIPIME) 2 g in sodium chloride 0.9 % 100 mL IVPB        05/06/23 0039 05/06/23 0142   05/06/23 0045  vancomycin (VANCOCIN) 2,250 mg in sodium chloride 0.9 % 500 mL IVPB  Status:  Discontinued        05/06/23 0039 05/06/23 0045        Intake/Output Summary (Last 24 hours) at 05/07/2023 0819 Last data filed at 05/07/2023 0700 Gross per 24 hour  Intake 2998 ml  Output 700 ml  Net 2298 ml     Physical Exam:  General: A/O x 4, No acute respiratory distress Eyes: negative scleral hemorrhage, negative anisocoria, negative  icterus ENT: Negative Runny nose, negative gingival bleeding, Neck:  Negative scars, masses, torticollis, lymphadenopathy, JVD Lungs: Clear to auscultation bilaterally without wheezes or crackles, RIGHT chest wall port negative sign of infection Cardiovascular: Regular rate and rhythm without murmur gallop or rub normal S1 and S2 Abdomen: negative abdominal pain, nondistended, positive soft, bowel sounds, no rebound, no ascites, no appreciable mass Extremities: No significant cyanosis, clubbing, or edema bilateral lower extremities Skin: Negative rashes, lesions, ulcers Psychiatric:  Negative depression, negative anxiety, negative fatigue, negative mania  Central nervous system:  Cranial nerves II through XII intact, tongue/uvula midline, all extremities muscle strength 5/5, sensation intact throughout, , negative dysarthria, negative expressive aphasia, negative receptive aphasia.    DVT prophylaxis: Lovenox Code Status:  Family Communication:  Status is: Inpatient    Dispo: The patient is from: Home              Anticipated d/c is to: Home              Anticipated d/c date is: > 3 days              Patient currently is not medically stable to d/c.      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  Would continue for 7 days given his immunocompromise  Dental caries with dental pain vs ostealgia/Possible pegfilgrastin adverse effect (ostealgia and leukocytosis of 55,000) -Multifactorial to include sepsis, and GSFpegfilgrastim administered by oncology on 5/10 -Maxillofacial CT with contrast showing dental caries without dental abscess or facial inflammation  Marked leukocytosis might be related to GSF  -Per UpToDate:  Leukocytosis (WBC ?100,000/mm3) has been reported in patients receiving pegfilgrastim products/ >10%: Neuromuscular & skeletal: Ostealgia (31%)  -Complete 7-day course of broad-spectrum antibiotics. - After 5 days of IV cefepime vancomycin and Flagyl, will transition to p.o. to allow patient to make dental appointment.  Unless blood cultures positive. 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 Rickard Patience,  Oncology Patient received second chemotherapy infusion on 5/10 (Brentuximab + Cyclophosphamide + Doxorubicin + Prednisone) q21 Days x 6 Cycles  Day 2, Cycle 2 as well as Pegfilgrastim -5/13 currently asymptomatic.   Hypokalemia -Potassium goal>4 -5/12 potassium IV 50 mEq  Obesity BMI 32.98 kg/m       Mobility Assessment (last 72 hours)     Mobility Assessment     Row Name 05/06/23 2120           Does patient have an order for bedrest or is patient medically unstable No - Continue assessment       What is the highest level of mobility based on the progressive mobility assessment? Level 6 (Walks independently in room and hall) - Balance while walking in room without assist - Complete                     Time: 50 minutes         Care during the described time interval was provided by  me .  I have reviewed this patient's available data, including medical history, events of note, physical examination, and all test results as part of my evaluation.

## 2023-05-07 NOTE — Consult Note (Signed)
[facility] Customer service manager [facility]) Accountable Care Organization (ACO) [facility] Liaison Note  05/07/2023  Randy [name] [birth year] [id number]  Location: [facility] RN Hospital Liaison met patient at bedside at [facility].  Insurance: [name]. is a 42 y.o. male who is a Primary Care Patient of Kara Dies, NP ([facility]). The patient was screened for readmission hospitalization with noted low risk score for unplanned readmission risk with 1 IP/1 ED in 6 months.  The patient was assessed for potential [facility]) Care Management service needs for post hospital transition for care coordination. Review of patient's electronic medical record reveals patient was admitted for Sepsis. Spoke with the pt and spouse at bedside. Confirm provider office. Pt feels oncologist team following closely and providing all of the pt's required needs at this time however pt is aware if any needs arise to contact [facility] office for those available resources. Patient was given an appointment reminder card and 24 hour Nurse Advice Line magnet.   Plan: [facility] Liaison will continue to follow progress and disposition to asess for post hospital community care coordination/management needs.  Referral request for community care coordination: pending disposition.   [facility] Care Management/Population Health does not replace or interfere with any arrangements made by the Inpatient Transition of Care team.   For questions contact:   Elliot Cousin, RN, BSN [facility] Liaison [place]   [facility]  Population Health Office Hours MTWF 8:00 am to 6 pm off on Thursday [phone] mobile [phone] [Office toll free line][facility] Office Hours are M-F 8:30 - 5 pm 24 hour nurse advise line [phone] Conceirge  [email or web address] [email or web address]

## 2023-05-08 DIAGNOSIS — C8475 Anaplastic large cell lymphoma, ALK-negative, lymph nodes of inguinal region and lower limb: Secondary | ICD-10-CM | POA: Diagnosis not present

## 2023-05-08 DIAGNOSIS — E669 Obesity, unspecified: Secondary | ICD-10-CM | POA: Diagnosis not present

## 2023-05-08 DIAGNOSIS — R110 Nausea: Secondary | ICD-10-CM | POA: Diagnosis not present

## 2023-05-08 DIAGNOSIS — A419 Sepsis, unspecified organism: Secondary | ICD-10-CM | POA: Diagnosis not present

## 2023-05-08 LAB — CBC WITH DIFFERENTIAL/PLATELET
Abs Immature Granulocytes: 3.05 10*3/uL — ABNORMAL HIGH (ref 0.00–0.07)
Basophils Absolute: 0.1 10*3/uL (ref 0.0–0.1)
Basophils Relative: 0 %
Eosinophils Absolute: 0 10*3/uL (ref 0.0–0.5)
Eosinophils Relative: 0 %
HCT: 40.8 % (ref 39.0–52.0)
Hemoglobin: 14 g/dL (ref 13.0–17.0)
Immature Granulocytes: 19 %
Lymphocytes Relative: 12 %
Lymphs Abs: 2 10*3/uL (ref 0.7–4.0)
MCH: 29 pg (ref 26.0–34.0)
MCHC: 34.3 g/dL (ref 30.0–36.0)
MCV: 84.5 fL (ref 80.0–100.0)
Monocytes Absolute: 0.4 10*3/uL (ref 0.1–1.0)
Monocytes Relative: 2 %
Neutro Abs: 11 10*3/uL — ABNORMAL HIGH (ref 1.7–7.7)
Neutrophils Relative %: 67 %
Platelets: 336 10*3/uL (ref 150–400)
RBC: 4.83 MIL/uL (ref 4.22–5.81)
RDW: 15.3 % (ref 11.5–15.5)
Smear Review: NORMAL
WBC Morphology: INCREASED
WBC: 16.5 10*3/uL — ABNORMAL HIGH (ref 4.0–10.5)
nRBC: 0 % (ref 0.0–0.2)

## 2023-05-08 LAB — COMPREHENSIVE METABOLIC PANEL
ALT: 25 U/L (ref 0–44)
AST: 22 U/L (ref 15–41)
Albumin: 3.8 g/dL (ref 3.5–5.0)
Alkaline Phosphatase: 144 U/L — ABNORMAL HIGH (ref 38–126)
Anion gap: 7 (ref 5–15)
BUN: 20 mg/dL (ref 6–20)
CO2: 28 mmol/L (ref 22–32)
Calcium: 8.9 mg/dL (ref 8.9–10.3)
Chloride: 102 mmol/L (ref 98–111)
Creatinine, Ser: 0.94 mg/dL (ref 0.61–1.24)
GFR, Estimated: >60 mL/min (ref >60)
Glucose, Bld: 97 mg/dL (ref 70–99)
Potassium: 3.5 mmol/L (ref 3.5–5.1)
Sodium: 137 mmol/L (ref 135–145)
Total Bilirubin: 0.9 mg/dL (ref 0.3–1.2)
Total Protein: 6.8 g/dL (ref 6.5–8.1)

## 2023-05-08 LAB — PHOSPHORUS: Phosphorus: 4.2 mg/dL (ref 2.5–4.6)

## 2023-05-08 LAB — MRSA NEXT GEN BY PCR, NASAL: MRSA by PCR Next Gen: NOT DETECTED

## 2023-05-08 LAB — MAGNESIUM: Magnesium: 2.2 mg/dL (ref 1.7–2.4)

## 2023-05-08 MED ORDER — SULFAMETHOXAZOLE-TRIMETHOPRIM 800-160 MG PO TABS
1.0000 | ORAL_TABLET | Freq: Two times a day (BID) | ORAL | Status: DC
  Administered 2023-05-08 – 2023-05-09 (×2): 1 via ORAL
  Filled 2023-05-08 (×2): qty 1

## 2023-05-08 MED ORDER — METRONIDAZOLE 500 MG PO TABS
500.0000 mg | ORAL_TABLET | Freq: Two times a day (BID) | ORAL | Status: DC
  Administered 2023-05-08 – 2023-05-09 (×2): 500 mg via ORAL
  Filled 2023-05-08 (×2): qty 1

## 2023-05-08 MED ORDER — CEFDINIR 300 MG PO CAPS
300.0000 mg | ORAL_CAPSULE | Freq: Two times a day (BID) | ORAL | Status: DC
  Administered 2023-05-08 – 2023-05-09 (×2): 300 mg via ORAL
  Filled 2023-05-08 (×2): qty 1

## 2023-05-08 MED ORDER — CARVEDILOL 3.125 MG PO TABS
3.1250 mg | ORAL_TABLET | Freq: Two times a day (BID) | ORAL | Status: DC
  Administered 2023-05-08 – 2023-05-09 (×2): 3.125 mg via ORAL
  Filled 2023-05-08 (×2): qty 1

## 2023-05-08 MED ORDER — CHLORHEXIDINE GLUCONATE CLOTH 2 % EX PADS
6.0000 | MEDICATED_PAD | Freq: Every day | CUTANEOUS | Status: DC
  Administered 2023-05-08 – 2023-05-09 (×2): 6 via TOPICAL

## 2023-05-08 NOTE — Progress Notes (Signed)
Randy [name]. [name] DOB: July 16, 1981 DOA: 05/05/2023 PCP: Patient, No Pcp Per   Subj: Randy [name]. is a 42 y.o. BM PMHx Seizures, Anaplastic large cell lymphoma he received his 2d round chemotherapy on 05/04/2023   Presents to the ED for evaluation of nausea vomiting and chills as well as right dental pain.  Patient had chemotherapy induced nausea after his first round but states that this feels different.  He denies abdominal pain, diarrhea or dysuria and he has no chest pain or shortness of breath.ED course and data review: Afebrile, pulse 95, BP 162/114 with respirations 22 and O2 sat 97% on room air WBC 55,000 up from 7000 on 5/10 lactic acid 2.3, procalcitonin pending. EKG, personally reviewed and interpreted showing NSR at 92 with nonspecific ST-T wave changes.   Chest x-ray with no active disease. CT abdomen and pelvis showing no acute abnormality.  Enlarged left inguinal node slightly decreased from prior Maxillofacial CT with contrast showing dental caries without dental abscess or facial inflammation as follows IMPRESSION: Poor dentition with multiple periapical lucencies and dental caries, including of the most posterior 2 remaining mandibular molars. No dental abscess or facial inflammation   Patient started on sepsis fluids, vancomycin and cefepime as well as morphine and Zofran and hospitalist consulted for admission.    Obj: 5/14 afebrile overnight, A/O x 4, negative CP, negative SOB.  States has contacted Randy [name] and has appointment on Monday.  States also has follow-up appointment with oncology Randy [name] Randy [name] in approximately 1 month   Objective: VITAL SIGNS: Temp: 98.4 F (36.9 C) (05/14 0746) Temp Source: Oral (05/14 0401) BP: 141/97 (05/14 0746) Pulse Rate: 85 (05/14 0746)   VENTILATOR SETTINGS: **  Procedures/Significant Events: 05/05/2023, CT abdomen pelvis with contrast showed left inguinal lymph node slightly decreased in size when compared to  the prior PET scan.  No acute abnormality is identified to correspond with given clinical history.   CT maxillofacial fracture with contrast showed poor dentition with multiple periapical lucencies and dental caries including of the most posterior to remaining mandibular molars.  No dental abscess or facial inflammation.   Consultants:  oncology Randy [name] Yu   Cultures 5/11 SARS coronavirus negative 5/12 blood Port-A-Cath NGTD  5/12 blood RIGHT arm NGTD   Antimicrobials: Anti-infectives (From admission, onward)    Start     Ordered Stop   05/06/23 1300  vancomycin (VANCOREADY) IVPB 1500 mg/300 mL  Status:  Discontinued        05/06/23 0321 05/06/23 0325   05/06/23 1300  vancomycin (VANCOREADY) IVPB 1500 mg/300 mL        05/06/23 0325 05/13/23 1259   05/06/23 1000  acyclovir (ZOVIRAX) 200 MG capsule 400 mg        05/06/23 0516     05/06/23 0900  ceFEPIme (MAXIPIME) 2 g in sodium chloride 0.9 % 100 mL IVPB  Status:  Discontinued        05/06/23 0322 05/06/23 0325   05/06/23 0900  ceFEPIme (MAXIPIME) 2 g in sodium chloride 0.9 % 100 mL IVPB        05/06/23 0325 05/13/23 0859   05/06/23 0300  metroNIDAZOLE (FLAGYL) IVPB 500 mg        05/06/23 0254 05/13/23 0259   05/06/23 0100  vancomycin (VANCOCIN) IVPB 1000 mg/200 mL premix       See Hyperspace for full Linked Orders Report.   05/06/23 0046 05/06/23 0200   05/06/23 0100  vancomycin (VANCOREADY) IVPB 1250 mg/250 mL  See Hyperspace for full Linked Orders Report.   05/06/23 0046 05/06/23 0514   05/06/23 0045  ceFEPIme (MAXIPIME) 2 g in sodium chloride 0.9 % 100 mL IVPB        05/06/23 0039 05/06/23 0142   05/06/23 0045  vancomycin (VANCOCIN) 2,250 mg in sodium chloride 0.9 % 500 mL IVPB  Status:  Discontinued        05/06/23 0039 05/06/23 0045        Intake/Output Summary (Last 24 hours) at 05/08/2023 1446 Last data filed at 05/08/2023 1417 Gross per 24 hour  Intake 1260 ml  Output 1675 ml  Net -415 ml    Physical  Exam:  General: A/O x 4, No acute respiratory distress Eyes: negative scleral hemorrhage, negative anisocoria, negative icterus ENT: Negative Runny nose, negative gingival bleeding, poor dentation Neck:  Negative scars, masses, torticollis, lymphadenopathy, JVD Lungs: Clear to auscultation bilaterally without wheezes or crackles Cardiovascular: Regular rate and rhythm without murmur gallop or rub normal S1 and S2 Abdomen: negative abdominal pain, nondistended, positive soft, bowel sounds, no rebound, no ascites, no appreciable mass Extremities: No significant cyanosis, clubbing, or edema bilateral lower extremities Skin: Negative rashes, lesions, ulcers Psychiatric:  Negative depression, negative anxiety, negative fatigue, negative mania  Central nervous system:  Cranial nerves II through XII intact, tongue/uvula midline, all extremities muscle strength 5/5, sensation intact throughout,negative dysarthria, negative expressive aphasia, negative receptive aphasia.    DVT prophylaxis: Lovenox Code Status:  Family Communication:  Status is: Inpatient    Dispo: The patient is from: Home              Anticipated d/c is to: Home              Anticipated d/c date is: > 3 days              Patient currently is not medically stable to d/c.      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  Would continue for 7 days given his immunocompromise -5/14 change IV antibiotics to p.o. given his significant drop in leukocytosis.  Understands he will then complete 7-day course as outpatient. - 5/14 if leukocytosis continues to resolve could discharge in the a.m. with his p.o. medication. -Acyclovir 400 mg BID -5/14 Omnicef 600 mg BID -5/14 Bactrim DS BID  Dental caries with dental pain vs ostealgia/Possible pegfilgrastin adverse effect (ostealgia and leukocytosis of 55,000) -Multifactorial to include sepsis, and GSFpegfilgrastim administered by oncology on 5/10 -Maxillofacial CT with contrast showing dental caries without dental abscess or facial inflammation  Marked leukocytosis might be related to GSF  -Per UpToDate:  Leukocytosis (WBC ?100,000/mm3) has been reported in patients receiving pegfilgrastim products/ >10%: Neuromuscular & skeletal: Ostealgia (31%)  -Complete 7-day course of broad-spectrum antibiotics. - After 5  days of IV cefepime vancomycin and Flagyl, will transition to p.o. to allow patient to make dental appointment.  Unless blood cultures positive. -oncology Randy [name] Randy [name] who agrees with plan of care -Patient has appointment on Monday with dentist for extraction of teeth as required.    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 Rickard Patience,  Oncology Patient received second chemotherapy infusion on 5/10 (Brentuximab + Cyclophosphamide + Doxorubicin + Prednisone) q21 Days x 6 Cycles  Day 2, Cycle 2 as well as Pegfilgrastim -5/13 currently asymptomatic.   Hypokalemia -Potassium goal>4 -5/12 potassium IV 50 mEq  Obesity BMI 32.98 kg/m        Mobility Assessment (last 72 hours)     Mobility Assessment     Row Name 05/07/23 2030 05/07/23 0919 05/06/23 2120       Does patient have an order for bedrest or is patient medically unstable No - Continue assessment No - Continue assessment No - Continue assessment     What is the highest level of mobility based on the progressive mobility assessment? Level 6 (Walks independently in room and hall) - Balance while walking in room without assist - Complete Level 6 (Walks independently in room and hall) - Balance while walking in room without assist - Complete Level 6 (Walks independently in room and hall) - Balance while walking in room without assist - Complete                   Time: 50 minutes         Care during the described time interval was provided by me .  I have reviewed this patient's available data, including medical history, events of note, physical examination, and all test results as part of my evaluation.

## 2023-05-08 NOTE — Progress Notes (Signed)
Pharmacy Antibiotic Note  Randy Lontz. is a 42 y.o. male admitted on 05/05/2023 with sepsis.  Pharmacy has been consulted for Vancomycin, Cefepime  dosing.  Assessment: 42 y.o. male with PMH anaplastic large cell lymphoma, migraines, seizures who presents with sepsis with emesis, dental pain and ostealgia s/p chemo administration on 5/10. CTAP unremarkable. Patient is neutropenic and also received filgrastim that day. Facial CT revealed poor dentition with multiple periapical lucencies and dental caries.  Plan: Day # 2 of antibiotics Continue cefepime 2 g IV q8H and vancomycin 1500 mg IV q12H Plan for vancomycin troughs between 4th and 5th maintenance doses Follow up culture results to assess for antibiotic optimization Monitor renal function to assess for any necessary antibiotic dosing changes  Continue metronidazole 500 mg IV q12H as part of the above regimen. Continue acyclovir 400 mg PO q12H (continued from home as HSV prophylaxis)  Height: 6\' 1"  (185.4 cm) Weight: 113.4 kg (250 lb) IBW/kg (Calculated) : 79.9  Temp (24hrs), Avg:98.3 F (36.8 C), Min:98.2 F (36.8 C), Max:98.4 F (36.9 C)  Recent Labs  Lab 05/03/23 0845 05/05/23 2307 05/06/23 0030 05/06/23 0430 05/06/23 1022 05/06/23 1229 05/06/23 2105 05/07/23 0449 05/08/23 0450  WBC 7.4 55.6*  --  53.9*  53.6*  --   --   --  46.4* 16.5*  CREATININE 1.22 0.96  --   --   --   --   --  0.92 0.94  LATICACIDVEN  --   --  2.3* 2.2* 2.3* 2.9* 2.6*  --   --      Estimated Creatinine Clearance: 139.2 mL/min (by C-G formula based on SCr of 0.94 mg/dL).    No Known Allergies  Antimicrobials this admission: Cefepime 5/12 >>  Vancomycin 5/12 >>  Metronidazole 5/12 >>  Dose adjustments this admission: N/A  Microbiology results: 5/12 BCx: NG2D  Thank you for allowing pharmacy to be a part of this patient's care.  Will M. Dareen Piano, PharmD PGY-1 Pharmacy Resident 05/08/2023 11:02 AM

## 2023-05-09 ENCOUNTER — Other Ambulatory Visit: Payer: Self-pay

## 2023-05-09 ENCOUNTER — Observation Stay: Payer: 59

## 2023-05-09 ENCOUNTER — Encounter: Payer: Self-pay | Admitting: Internal Medicine

## 2023-05-09 ENCOUNTER — Observation Stay
Admission: EM | Admit: 2023-05-09 | Discharge: 2023-05-10 | Disposition: A | Discharge status: Home / Self Care | Payer: 59 | Attending: Family Medicine | Admitting: Family Medicine

## 2023-05-09 DIAGNOSIS — R945 Abnormal results of liver function studies: Secondary | ICD-10-CM | POA: Insufficient documentation

## 2023-05-09 DIAGNOSIS — N179 Acute kidney failure, unspecified: Secondary | ICD-10-CM | POA: Diagnosis not present

## 2023-05-09 DIAGNOSIS — Z79899 Other long term (current) drug therapy: Secondary | ICD-10-CM | POA: Diagnosis not present

## 2023-05-09 DIAGNOSIS — C8470 Anaplastic large cell lymphoma, ALK-negative, unspecified site: Secondary | ICD-10-CM | POA: Insufficient documentation

## 2023-05-09 DIAGNOSIS — T451X5A Adverse effect of antineoplastic and immunosuppressive drugs, initial encounter: Secondary | ICD-10-CM | POA: Diagnosis not present

## 2023-05-09 DIAGNOSIS — I10 Essential (primary) hypertension: Secondary | ICD-10-CM | POA: Diagnosis present

## 2023-05-09 DIAGNOSIS — E876 Hypokalemia: Secondary | ICD-10-CM | POA: Diagnosis not present

## 2023-05-09 DIAGNOSIS — R7989 Other specified abnormal findings of blood chemistry: Secondary | ICD-10-CM | POA: Insufficient documentation

## 2023-05-09 DIAGNOSIS — R55 Syncope and collapse: Secondary | ICD-10-CM | POA: Diagnosis not present

## 2023-05-09 DIAGNOSIS — K029 Dental caries, unspecified: Secondary | ICD-10-CM | POA: Diagnosis present

## 2023-05-09 DIAGNOSIS — A419 Sepsis, unspecified organism: Secondary | ICD-10-CM | POA: Diagnosis not present

## 2023-05-09 DIAGNOSIS — Z87891 Personal history of nicotine dependence: Secondary | ICD-10-CM | POA: Insufficient documentation

## 2023-05-09 DIAGNOSIS — I159 Secondary hypertension, unspecified: Secondary | ICD-10-CM | POA: Diagnosis not present

## 2023-05-09 DIAGNOSIS — E669 Obesity, unspecified: Secondary | ICD-10-CM | POA: Diagnosis present

## 2023-05-09 DIAGNOSIS — C8475 Anaplastic large cell lymphoma, ALK-negative, lymph nodes of inguinal region and lower limb: Secondary | ICD-10-CM | POA: Diagnosis not present

## 2023-05-09 DIAGNOSIS — R110 Nausea: Secondary | ICD-10-CM | POA: Diagnosis not present

## 2023-05-09 LAB — COMPREHENSIVE METABOLIC PANEL
ALT: 65 U/L — ABNORMAL HIGH (ref 0–44)
ALT: 69 U/L — ABNORMAL HIGH (ref 0–44)
AST: 50 U/L — ABNORMAL HIGH (ref 15–41)
AST: 50 U/L — ABNORMAL HIGH (ref 15–41)
Albumin: 4 g/dL (ref 3.5–5.0)
Albumin: 4.1 g/dL (ref 3.5–5.0)
Alkaline Phosphatase: 134 U/L — ABNORMAL HIGH (ref 38–126)
Alkaline Phosphatase: 139 U/L — ABNORMAL HIGH (ref 38–126)
Anion gap: 10 (ref 5–15)
Anion gap: 11 (ref 5–15)
BUN: 20 mg/dL (ref 6–20)
BUN: 19 mg/dL (ref 6–20)
CO2: 27 mmol/L (ref 22–32)
CO2: 24 mmol/L (ref 22–32)
Calcium: 8.9 mg/dL (ref 8.9–10.3)
Calcium: 9 mg/dL (ref 8.9–10.3)
Chloride: 100 mmol/L (ref 98–111)
Chloride: 100 mmol/L (ref 98–111)
Creatinine, Ser: 0.94 mg/dL (ref 0.61–1.24)
Creatinine, Ser: 1.29 mg/dL — ABNORMAL HIGH (ref 0.61–1.24)
GFR, Estimated: >60 mL/min (ref >60)
GFR, Estimated: >60 mL/min (ref >60)
Glucose, Bld: 100 mg/dL — ABNORMAL HIGH (ref 70–99)
Glucose, Bld: 113 mg/dL — ABNORMAL HIGH (ref 70–99)
Potassium: 3.5 mmol/L (ref 3.5–5.1)
Potassium: 3.2 mmol/L — ABNORMAL LOW (ref 3.5–5.1)
Sodium: 137 mmol/L (ref 135–145)
Sodium: 135 mmol/L (ref 135–145)
Total Bilirubin: 0.7 mg/dL (ref 0.3–1.2)
Total Bilirubin: 1 mg/dL (ref 0.3–1.2)
Total Protein: 6.8 g/dL (ref 6.5–8.1)
Total Protein: 7 g/dL (ref 6.5–8.1)

## 2023-05-09 LAB — CBC WITH DIFFERENTIAL/PLATELET
Abs Immature Granulocytes: 0.04 10*3/uL (ref 0.00–0.07)
Abs Immature Granulocytes: 0.12 10*3/uL — ABNORMAL HIGH (ref 0.00–0.07)
Basophils Absolute: 0.1 10*3/uL (ref 0.0–0.1)
Basophils Absolute: 0.1 10*3/uL (ref 0.0–0.1)
Basophils Relative: 1 %
Basophils Relative: 1 %
Eosinophils Absolute: 0.1 10*3/uL (ref 0.0–0.5)
Eosinophils Absolute: 0.1 10*3/uL (ref 0.0–0.5)
Eosinophils Relative: 1 %
Eosinophils Relative: 1 %
HCT: 44.1 % (ref 39.0–52.0)
HCT: 45.2 % (ref 39.0–52.0)
Hemoglobin: 15 g/dL (ref 13.0–17.0)
Hemoglobin: 15.2 g/dL (ref 13.0–17.0)
Immature Granulocytes: 1 %
Immature Granulocytes: 1 %
Lymphocytes Relative: 25 %
Lymphocytes Relative: 21 %
Lymphs Abs: 1.5 10*3/uL (ref 0.7–4.0)
Lymphs Abs: 2.2 10*3/uL (ref 0.7–4.0)
MCH: 28.2 pg (ref 26.0–34.0)
MCH: 28 pg (ref 26.0–34.0)
MCHC: 34 g/dL (ref 30.0–36.0)
MCHC: 33.6 g/dL (ref 30.0–36.0)
MCV: 83.1 fL (ref 80.0–100.0)
MCV: 83.2 fL (ref 80.0–100.0)
Monocytes Absolute: 0.4 10*3/uL (ref 0.1–1.0)
Monocytes Absolute: 0.8 10*3/uL (ref 0.1–1.0)
Monocytes Relative: 6 %
Monocytes Relative: 7 %
Neutro Abs: 3.9 10*3/uL (ref 1.7–7.7)
Neutro Abs: 7.2 10*3/uL (ref 1.7–7.7)
Neutrophils Relative %: 66 %
Neutrophils Relative %: 69 %
Platelets: 299 10*3/uL (ref 150–400)
Platelets: 349 10*3/uL (ref 150–400)
RBC: 5.31 MIL/uL (ref 4.22–5.81)
RBC: 5.43 MIL/uL (ref 4.22–5.81)
RDW: 15.2 % (ref 11.5–15.5)
RDW: 15.1 % (ref 11.5–15.5)
Smear Review: NORMAL
Smear Review: NORMAL
WBC: 5.9 10*3/uL (ref 4.0–10.5)
WBC: 10.4 10*3/uL (ref 4.0–10.5)
nRBC: 0 % (ref 0.0–0.2)
nRBC: 0 % (ref 0.0–0.2)

## 2023-05-09 LAB — PHOSPHORUS: Phosphorus: 5 mg/dL — ABNORMAL HIGH (ref 2.5–4.6)

## 2023-05-09 LAB — TROPONIN I (HIGH SENSITIVITY)
Troponin I (High Sensitivity): 20 ng/L — ABNORMAL HIGH (ref <18)
Troponin I (High Sensitivity): 10 ng/L (ref <18)

## 2023-05-09 LAB — MAGNESIUM
Magnesium: 2.3 mg/dL (ref 1.7–2.4)
Magnesium: 2.1 mg/dL (ref 1.7–2.4)

## 2023-05-09 LAB — CBG MONITORING, ED: Glucose-Capillary: 99 mg/dL (ref 70–99)

## 2023-05-09 LAB — LACTIC ACID, PLASMA
Lactic Acid, Venous: 3.8 mmol/L (ref 0.5–1.9)
Lactic Acid, Venous: 2.2 mmol/L (ref 0.5–1.9)

## 2023-05-09 LAB — CULTURE, BLOOD (ROUTINE X 2)

## 2023-05-09 MED ORDER — ACETAMINOPHEN 325 MG PO TABS: 650.0000 mg | ORAL_TABLET | Freq: Four times a day (QID) | ORAL | Status: DC | PRN

## 2023-05-09 MED ORDER — HEPARIN SOD (PORK) LOCK FLUSH 100 UNIT/ML IV SOLN
500.0000 [IU] | Freq: Once | INTRAVENOUS | Status: AC
  Administered 2023-05-09: 500 [IU] via INTRAVENOUS
  Filled 2023-05-09: qty 5

## 2023-05-09 MED ORDER — CHLORHEXIDINE GLUCONATE 0.12 % MT SOLN
15.0000 mL | Freq: Two times a day (BID) | OROMUCOSAL | Status: DC
  Administered 2023-05-09: 15 mL via OROMUCOSAL

## 2023-05-09 MED ORDER — ACYCLOVIR 200 MG PO CAPS
400.0000 mg | ORAL_CAPSULE | Freq: Two times a day (BID) | ORAL | Status: DC
  Administered 2023-05-09: 400 mg via ORAL
  Filled 2023-05-09 (×2): qty 2

## 2023-05-09 MED ORDER — SODIUM CHLORIDE 0.9% FLUSH
3.0000 mL | Freq: Two times a day (BID) | INTRAVENOUS | Status: DC
  Administered 2023-05-09: 3 mL via INTRAVENOUS

## 2023-05-09 MED ORDER — SODIUM CHLORIDE 0.9 % IV BOLUS
1000.0000 mL | Freq: Once | INTRAVENOUS | Status: AC
  Administered 2023-05-09: 1000 mL via INTRAVENOUS

## 2023-05-09 MED ORDER — CARVEDILOL 3.125 MG PO TABS: 3.1250 mg | ORAL_TABLET | Freq: Two times a day (BID) | ORAL | 1 refills | Status: DC

## 2023-05-09 MED ORDER — ENOXAPARIN SODIUM 60 MG/0.6ML IJ SOSY
55.0000 mg | PREFILLED_SYRINGE | INTRAMUSCULAR | Status: DC
  Administered 2023-05-09: 55 mg via SUBCUTANEOUS
  Filled 2023-05-09: qty 0.6

## 2023-05-09 MED ORDER — CEFDINIR 300 MG PO CAPS
300.0000 mg | ORAL_CAPSULE | Freq: Two times a day (BID) | ORAL | Status: DC
  Administered 2023-05-09: 300 mg via ORAL
  Filled 2023-05-09 (×2): qty 1

## 2023-05-09 MED ORDER — SENNOSIDES-DOCUSATE SODIUM 8.6-50 MG PO TABS: 1.0000 | ORAL_TABLET | Freq: Every evening | ORAL | Status: DC | PRN

## 2023-05-09 MED ORDER — SULFAMETHOXAZOLE-TRIMETHOPRIM 800-160 MG PO TABS
1.0000 | ORAL_TABLET | Freq: Two times a day (BID) | ORAL | Status: DC
  Administered 2023-05-09: 1 via ORAL
  Filled 2023-05-09: qty 1

## 2023-05-09 MED ORDER — POTASSIUM CHLORIDE CRYS ER 20 MEQ PO TBCR
40.0000 meq | EXTENDED_RELEASE_TABLET | Freq: Once | ORAL | Status: AC
  Administered 2023-05-09: 40 meq via ORAL
  Filled 2023-05-09: qty 2

## 2023-05-09 MED ORDER — SULFAMETHOXAZOLE-TRIMETHOPRIM 800-160 MG PO TABS: 1.0000 | ORAL_TABLET | Freq: Two times a day (BID) | ORAL | 0 refills | Status: DC

## 2023-05-09 MED ORDER — TRAMADOL HCL 50 MG PO TABS: 50.0000 mg | ORAL_TABLET | Freq: Four times a day (QID) | ORAL | Status: DC | PRN

## 2023-05-09 MED ORDER — ACETAMINOPHEN 650 MG RE SUPP: 650.0000 mg | Freq: Four times a day (QID) | RECTAL | Status: DC | PRN

## 2023-05-09 MED ORDER — CEFDINIR 300 MG PO CAPS: 300.0000 mg | ORAL_CAPSULE | Freq: Two times a day (BID) | ORAL | 0 refills | Status: DC

## 2023-05-09 MED ORDER — LACTATED RINGERS IV SOLN: INTRAVENOUS | Status: DC

## 2023-05-09 MED ORDER — HYDRALAZINE HCL 20 MG/ML IJ SOLN: 5.0000 mg | Freq: Three times a day (TID) | INTRAMUSCULAR | Status: DC | PRN

## 2023-05-09 NOTE — Progress Notes (Signed)
Patient discharged home. All belongings sent home with patient. Port deaccessed. All discharge education and instructions provided to patient all questions answered.

## 2023-05-09 NOTE — Progress Notes (Signed)
This RN exited [facility] [employer] at [employer] to find visitor and volunteers fanning patient in wheelchair. Patient had just been discharged from inpatient. Patient's eyes closed but responsive, diaphoretic. Assisted respiratory and ED charge Marylene Land to room ED 49 for appropriate care. Visitor at bedside, [facility] at bedside, charge RN Marylene Land at bedside, MD Dr. Rosalia Hammers at bedside, and respiratory at bedside.

## 2023-05-09 NOTE — Progress Notes (Signed)
  Transition of Care [facility]) Screening Note   Patient Details  Name: Randy [name]. Date of Birth: 09-13-1981   Transition of Care [facility]) CM/SW Contact:    Garret Reddish, RN Phone Number: 05/09/2023, 10:34 AM    Transition of Care Department [facility]) has reviewed patient and no TOC needs have been identified at this time. We will continue to monitor patient advancement through interdisciplinary progression rounds. If new patient transition needs arise, please place a TOC consult.

## 2023-05-09 NOTE — ED Provider Notes (Signed)
[facility] Provider Note    Event Date/Time   First MD Initiated Contact with Patient 05/09/23 1220     (approximate)   History   Loss of Consciousness   HPI  Randy Fang. is a 40 y.o. male 42 year old male with history of seizures, lymphoma on chemotherapy presenting to the emergency department following a syncopal episode.  Patient was admitted from 5/11 to this morning for weakness during which she was found to have significantly elevated white count and was admitted for suspected infection.  Cultures had no growth, thought to be possible dental infection.  Was discharged on antibiotics.  While he was getting in his car this morning, wife noticed that he had a few seconds of tremors after which she passed out for a few seconds.  He did not hit his head.  He does have a history of seizures in childhood, no recent episodes.  Patient was noted to be pale and diaphoretic.  Denies chest pain or shortness of breath.  Rapid response was called and patient was brought to the emergency room.  Family does report that patient received blood pressure medicine prior to being discharged and patient reports that he felt poorly after this.  On review on his chart, it does appear that he was started on Coreg.  Physical Exam   Triage Vital Signs: ED Triage Vitals  Enc Vitals Group     BP 05/09/23 1206 (!) 80/60     Pulse Rate 05/09/23 1204 89     Resp --      Temp --      Temp src --      SpO2 05/09/23 1204 99 %     Weight --      Height --      Head Circumference --      Peak Flow --      Pain Score 05/09/23 1214 0     Pain Loc --      Pain Edu? --      Excl. in GC? --     Most recent vital signs: Vitals:   05/09/23 1800 05/09/23 1830  BP: 134/83 (!) 150/91  Pulse:    Resp: 14 13  Temp:    SpO2:       General: Pale, somnolent but arousable, diaphoretic CV:  Regular rate, good peripheral perfusion.  Resp:  Lungs clear, respirations mildly  labored Abd:  Soft, nondistended.  Neuro:  Symmetric facial movement, fluid speech   ED Results / Procedures / Treatments   Labs (all labs ordered are listed, but only abnormal results are displayed) Labs Reviewed  CBC WITH DIFFERENTIAL/PLATELET - Abnormal; Notable for the following components:      Result Value   Abs Immature Granulocytes 0.12 (*)    All other components within normal limits  COMPREHENSIVE METABOLIC PANEL - Abnormal; Notable for the following components:   Potassium 3.2 (*)    Glucose, Bld 113 (*)    Creatinine, Ser 1.29 (*)    AST 50 (*)    ALT 69 (*)    Alkaline Phosphatase 139 (*)    All other components within normal limits  LACTIC ACID, PLASMA - Abnormal; Notable for the following components:   Lactic Acid, Venous 3.8 (*)    All other components within normal limits  LACTIC ACID, PLASMA - Abnormal; Notable for the following components:   Lactic Acid, Venous 2.2 (*)    All other components within normal limits  TROPONIN I (  HIGH SENSITIVITY) - Abnormal; Notable for the following components:   Troponin I (High Sensitivity) 20 (*)    All other components within normal limits  CULTURE, BLOOD (ROUTINE X 2)  CULTURE, BLOOD (ROUTINE X 2)  MAGNESIUM  BASIC METABOLIC PANEL  CBC  HEPATIC FUNCTION PANEL  CBG MONITORING, ED  TROPONIN I (HIGH SENSITIVITY)     EKG EKG independently reviewed interpreted by myself (ER attending) demonstrates:  EKG demonstrates sinus rhythm at a rate of 90, PR 162, QRS 92, QTc 459, T wave inversions noted in the inferior and high lateral leads  RADIOLOGY Imaging independently reviewed and interpreted by myself demonstrates:    PROCEDURES:  Critical Care performed: Yes, see critical care procedure note(s)  CRITICAL CARE Performed by: Trinna Post   Total critical care time: 30 minutes  Critical care time was exclusive of separately billable procedures and treating other patients.  Critical care was necessary to treat or  prevent imminent or life-threatening deterioration.  Critical care was time spent personally by me on the following activities: development of treatment plan with patient and/or surrogate as well as nursing, discussions with consultants, evaluation of patient's response to treatment, examination of patient, obtaining history from patient or surrogate, ordering and performing treatments and interventions, ordering and review of laboratory studies, ordering and review of radiographic studies, pulse oximetry and re-evaluation of patient's condition.   Procedures   MEDICATIONS ORDERED IN ED: Medications  sodium chloride flush (NS) 0.9 % injection 3 mL (3 mLs Intravenous Not Given 05/09/23 1505)  enoxaparin (LOVENOX) injection 55 mg (has no administration in time range)  senna-docusate (Senokot-S) tablet 1 tablet (has no administration in time range)  acetaminophen (TYLENOL) tablet 650 mg (has no administration in time range)    Or  acetaminophen (TYLENOL) suppository 650 mg (has no administration in time range)  hydrALAZINE (APRESOLINE) injection 5 mg (has no administration in time range)  lactated ringers infusion ( Intravenous New Bag/Given 05/09/23 1727)  traMADol (ULTRAM) tablet 50 mg (has no administration in time range)  acyclovir (ZOVIRAX) 200 MG capsule 400 mg (has no administration in time range)  cefdinir (OMNICEF) capsule 300 mg (has no administration in time range)  sulfamethoxazole-trimethoprim (BACTRIM DS) 800-160 MG per tablet 1 tablet (has no administration in time range)  chlorhexidine (PERIDEX) 0.12 % solution 15 mL (has no administration in time range)  sodium chloride 0.9 % bolus 1,000 mL (0 mLs Intravenous Stopped 05/09/23 1334)  sodium chloride 0.9 % bolus 1,000 mL (0 mLs Intravenous Stopped 05/09/23 1547)  potassium chloride SA (KLOR-CON M) CR tablet 40 mEq (40 mEq Oral Given 05/09/23 1727)     IMPRESSION / MDM / ASSESSMENT AND PLAN / ED COURSE  I reviewed the triage vital  signs and the nursing notes.  Differential diagnosis includes, but is not limited to, vasovagal syncope secondary to hypotension related to initiation of new blood pressure medication, sepsis from unidentified or dental infectious source, cardiac syncope, seizure  Patient's presentation is most consistent with acute presentation with potential threat to life or bodily function.  42 year old male presenting to the emergency department for evaluation following a syncopal episode.  On presentation here he was noted to be hypotensive with initial blood pressure of 80/60.  He was diaphoretic and pale.  BGL 99.  IV access was difficult, so his port was accessed and he was given IV fluid resuscitation with improvement in his blood pressure.  Episode could be related to a initiation of antihypertensive medication, but will obtain repeat  labs, blood cultures and plan for continued observation. Clinical Course as of 05/09/23 1914  Wed May 09, 2023  1421 Lactic Acid, Venous(!!): 3.8 Significantly elevated lactate at 3.8.  Second liter of IV fluid ordered. [NR]  1422 Troponin I (High Sensitivity)(!): 20 Mild troponin elevation, no chest pain, repeat pending [NR]    Clinical Course User Index [NR] Trinna Post, MD   With observation and fluid resuscitation, patient's blood pressure fortunately improved.  Repeat lactate improved to 2.2.  Repeat troponin normalized.  Case discussed with hospitalist team.  They will admit the patient for further evaluation.   FINAL CLINICAL IMPRESSION(S) / ED DIAGNOSES   Final diagnoses:  Syncope, unspecified syncope type     Rx / DC Orders   ED Discharge Orders     None        Note:  This document was prepared using Dragon voice recognition software and may include unintentional dictation errors.   Trinna Post, MD 05/09/23 [id number]

## 2023-05-09 NOTE — Assessment & Plan Note (Signed)
Holding home antihypertensive medication at this time

## 2023-05-09 NOTE — H&P (Addendum)
History and Physical   Randy [name]. [name] DOB: October 21, 1981 DOA: 05/09/2023  PCP: Randy Dies, NP  Outpatient Specialists: Dr. Cathie Hoops, medical oncology Patient coming from: Private car outside of hospital  I have personally briefly reviewed patient's old medical records in [facility] EMR.  Chief Concern: Syncope  HPI: 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  Of note he was being discharged and was getting into his car when he had a syncopal event.  Vitals in the ED temperature 98.3, respiration rate of 17, heart rate of 89, blood pressure 115/92, SpO2 of 99% on room air.  Serum sodium is 135, potassium 3.2, chloride 100, bicarb 24, nonfasting glucose 113, BUN of 19, serum creatinine of 1.29, EGFR greater than 60, alk phos was elevated at 139, AST 50, ALT of 69.  WBC 10.4, hemoglobin 15.2, platelets of 349.  Lactic acid was 3.8 and on repeat was 2.1.  High sensitive troponin was 20, pending second collection.  Repeat blood cultures x 2 are in process.  ED treatment: Sodium chloride 1 L bolus x 2. --------------------------- At bedside, patient was able to tell me his name, age, location, current calendar year.  He does not appear to be in acute distress.  He reports that he feels better now.  He reports brief loss of consciousness and blacking out when he was trying to get into his car.  He reports that the entire day prior to discharge, he did not drink any water or have any true breakfast.  He reports the only thing he had was a small bowl of cereal with milk.  He reports he had ordered breakfast but it was not brought to him.  And he did not get to drink any water prior to leaving the hospital.  He denies head trauma.  He denies  chest pain, shortness of breath, dysuria, hematuria, diarrhea, swelling of his lower extremities, subjective fever, chills.  ROS: Constitutional: no weight change, no fever ENT/Mouth: no sore throat, no rhinorrhea Eyes: no eye pain, no vision changes Cardiovascular: no chest pain, no dyspnea,  no edema, no palpitations Respiratory: no cough, no sputum, no wheezing Gastrointestinal: no nausea, no vomiting, no diarrhea, no constipation Genitourinary: no urinary incontinence, no dysuria, no hematuria Musculoskeletal: no arthralgias, no myalgias Skin: no skin lesions, no pruritus, Neuro: + weakness, no loss of consciousness, no syncope Psych: no anxiety, no depression, no decrease appetite Heme/Lymph: no bruising, no bleeding  ED Course: Discussed with emergency medicine provider, patient requiring hospitalization for chief concerns of syncope.  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 intake prior to discharge Orthostatic vital signs ordered LR 125 mL/h, 1 day ordered Admit to telemetry cardiac, observation  Elevated LFTs Mildly elevated AST, ALT, alk phos Alk phos has been intermittently elevated throughout recent hospital course Avoid hepatotoxic agents Will check LFTs in the a.m.  AKI (acute kidney injury) ([name]) Versus lab error,  given serum creatinine from labs at 04:58 on day of discharge/admission was 0.94 Repeat BMP in the a.m.  Obesity (BMI 30-39.9) This complicates overall care and prognosis.   Dental caries Resumed cefdinir 300 mg p.o. every 12 hours and Bactrim 800-160  every 12 hours p.o.  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 twice daily  Hypokalemia Check magnesium level on admission Potassium chloride 40 mill equivalent p.o. one-time dose ordered LR 125 mL/h, 1 day ordered  Chart reviewed.   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 His smoking use included cigarettes. He does not have any smokeless tobacco history on file. He reports current alcohol use. He reports current drug use. Frequency: 5.00 times per week. Drug: Marijuana.  No Known Allergies Family History  Problem Relation Age of Onset   Diabetes Mother    Hypertension Mother    Hypertension Father    Stroke Father    Diabetes Father    Cancer Paternal Grandmother        breat cancer   Family history: Family history reviewed and not pertinent.  Prior to Admission medications   Medication Sig Start Date End Date Taking?  Authorizing Provider  acyclovir (ZOVIRAX) 400 MG tablet Take 1 tablet (400 mg total) by mouth 2 (two) times daily. 04/02/23   Rickard Patience, MD  carvedilol (COREG) 3.125 MG tablet Take 1 tablet (3.125 mg total) by mouth 2 (two) times daily with a meal. 05/09/23   Danford, Earl Lites, MD  cefdinir (OMNICEF) 300 MG capsule Take 1 capsule (300 mg total) by mouth every 12 (twelve) hours. 05/09/23   Danford, Earl Lites, MD  chlorhexidine (PERIDEX) 0.12 % solution Use as directed 15 mLs in the mouth or throat 2 (two) times daily. 04/12/23   Rickard Patience, MD  ibuprofen (ADVIL) 200 MG tablet Take 400 mg by mouth every 6 (six) hours as needed for moderate pain.    [provider]  lidocaine-prilocaine (EMLA) cream Apply to affected area once 04/02/23   Rickard Patience, MD  ondansetron (ZOFRAN) 8 MG tablet Take 1 tablet (8 mg total) by mouth every 8 (eight) hours as needed for nausea or vomiting. Start on the third day after cyclophosphamide chemotherapy. 04/02/23   Rickard Patience, MD  predniSONE (DELTASONE) 20 MG tablet Take 5 tablets (100 mg total) by mouth daily. Take on days 2-5 of chemotherapy. 04/02/23   Rickard Patience, MD  prochlorperazine (COMPAZINE) 10 MG tablet Take 1 tablet (10 mg total) by mouth every 6 (six) hours as needed  for nausea or vomiting. 04/02/23   Rickard Patience, MD  sulfamethoxazole-trimethoprim (BACTRIM DS) 800-160 MG tablet Take 1 tablet by mouth every 12 (twelve) hours. 05/09/23   Danford, Earl Lites, MD  traMADol (ULTRAM) 50 MG tablet Take 1 tablet (50 mg total) by mouth every 6 (six) hours as needed. Patient taking differently: Take 50 mg by mouth every 6 (six) hours as needed for moderate pain. 02/09/23   Minna Antis, MD  valsartan-hydrochlorothiazide (DIOVAN-HCT) 80-12.5 MG tablet Take 1 tablet by mouth daily. 05/04/23   Randy Dies, NP   Physical Exam: Vitals:   05/09/23 1547 05/09/23 1600 05/09/23 1630 05/09/23 1700  BP:  (!) 142/82 131/79 (!) 147/86  Pulse:  (!) 101 (!) 103 (!) 103  Resp:   (!) 21 18 (!) 27  Temp: 98.3 F (36.8 C)     TempSrc: Oral     SpO2:  98% 97% 97%   Constitutional: appears age-appropriate, NAD, calm Eyes: PERRL, lids and conjunctivae normal ENMT: Mucous membranes are moist. Posterior pharynx clear of any exudate or lesions. Age-appropriate dentition. Hearing appropriate Neck: normal, supple, no masses, no thyromegaly Respiratory: clear to auscultation bilaterally, no wheezing, no crackles. Normal respiratory effort. No accessory muscle use.  Cardiovascular: Regular rate and rhythm, no murmurs / rubs / gallops. No extremity edema. 2+ pedal pulses. No carotid bruits.  Abdomen: no tenderness, no masses palpated, no hepatosplenomegaly. Bowel sounds positive.  Musculoskeletal: no clubbing / cyanosis. No joint deformity upper and lower extremities. Good ROM, no contractures, no atrophy. Normal muscle tone.  Skin: no rashes, lesions, ulcers. No induration Neurologic: Sensation intact. Strength 5/5 in all 4.  Psychiatric: Normal judgment and insight. Alert and oriented x 3. Normal mood.   EKG: independently reviewed, showing sinus rhythm with rate of 90, QTc 459, LVH  Chest x-ray on Admission: I personally reviewed and I agree with radiologist reading as below.  X-ray chest PA and lateral  Result Date: 05/09/2023 CLINICAL DATA:  Syncope EXAM: CHEST - 2 VIEW COMPARISON:  05/05/2023 FINDINGS: Right-sided central venous port tip over the cavoatrial region. No acute airspace disease or effusion. Stable cardiomediastinal silhouette. No pneumothorax. IMPRESSION: No active cardiopulmonary disease. Electronically Signed   By: Jasmine Pang M.D.   On: 05/09/2023 15:21    Labs on Admission: I have personally reviewed following labs  CBC: Recent Labs  Lab 05/06/23 0430 05/07/23 0449 05/08/23 0450 05/09/23 0458 05/09/23 1223  WBC 53.9*  53.6* 46.4* 16.5* 5.9 10.4  NEUTROABS 48.5* 30.8* 11.0* 3.9 7.2  HGB 13.6  13.4 14.3 14.0 15.0 15.2  HCT 41.8  40.5 42.4  40.8 44.1 45.2  MCV 88.6  85.6 84.3 84.5 83.1 83.2  PLT 422*  403* 367 336 299 349   Basic Metabolic Panel: Recent Labs  Lab 05/05/23 2307 05/06/23 1022 05/07/23 0449 05/08/23 0450 05/09/23 0458 05/09/23 1223 05/09/23 1555  NA 137  --  139 137 137 135  --   K 3.2*  --  3.7 3.5 3.5 3.2*  --   CL 102  --  103 102 100 100  --   CO2 24  --  27 28 27 24   --   GLUCOSE 100*  --  110* 97 100* 113*  --   BUN 16  --  16 20 20 19   --   CREATININE 0.96  --  0.92 0.94 0.94 1.29*  --   CALCIUM 9.1  --  9.2 8.9 8.9 9.0  --   MG  --  2.1  2.3 2.2 2.3  --  2.1  PHOS  --  2.6 4.7* 4.2 5.0*  --   --    GFR: Estimated Creatinine Clearance: 101.5 mL/min (A) (by C-G formula based on SCr of 1.29 mg/dL (H)).  Liver Function Tests: Recent Labs  Lab 05/05/23 2307 05/07/23 0449 05/08/23 0450 05/09/23 0458 05/09/23 1223  AST 37 27 22 50* 50*  ALT 39 30 25 65* 69*  ALKPHOS 120 156* 144* 134* 139*  BILITOT 0.7 1.0 0.9 0.7 1.0  PROT 7.1 7.0 6.8 6.8 7.0  ALBUMIN 4.2 4.0 3.8 4.0 4.1   Recent Labs  Lab 05/05/23 2307  LIPASE 99*   Coagulation Profile: Recent Labs  Lab 05/06/23 1022  INR 1.1   CBG: Recent Labs  Lab 05/09/23 1200  GLUCAP 99   Urine analysis:    Component Value Date/Time   COLORURINE YELLOW (A) 05/06/2023 0130   APPEARANCEUR CLEAR 05/06/2023 0130   LABSPEC 1.015 05/06/2023 0130   PHURINE 7.0 05/06/2023 0130   GLUCOSEU NEGATIVE 05/06/2023 0130   HGBUR NEGATIVE 05/06/2023 0130   BILIRUBINUR NEGATIVE 05/06/2023 0130   KETONESUR NEGATIVE 05/06/2023 0130   PROTEINUR NEGATIVE 05/06/2023 0130   NITRITE NEGATIVE 05/06/2023 0130   LEUKOCYTESUR NEGATIVE 05/06/2023 0130   This document was prepared using Dragon Voice Recognition software and may include unintentional dictation errors.  Dr. Sedalia Muta [facility]  If 7PM-7AM, please contact overnight-coverage provider If 7AM-7PM, please contact day attending provider [email or web address]  05/09/2023, 5:31 PM

## 2023-05-09 NOTE — Assessment & Plan Note (Signed)
Mildly elevated AST, ALT, alk phos Alk phos has been intermittently elevated throughout recent hospital course Avoid hepatotoxic agents Will check LFTs in the a.m.

## 2023-05-09 NOTE — Assessment & Plan Note (Addendum)
Check magnesium level on admission Potassium chloride 40 mill equivalent p.o. one-time dose ordered LR 125 mL/h, 1 day ordered

## 2023-05-09 NOTE — Hospital Course (Addendum)
Mr. Demontray Dame is a 42 year old male with history of seizure, and a plastic large cell lymphoma currently on chemotherapy, last chemotherapy was on 05/04/2023, hypertension, transaminitis, obesity, who presents to the emergency department for chief concerns of syncope.  Of note he was being discharged and was getting into his car when he had a syncopal event.  Vitals in the ED temperature 98.3, respiration rate of 17, heart rate of 89, blood pressure 115/92, SpO2 of 99% on room air.  Serum sodium is 135, potassium 3.2, chloride 100, bicarb 24, nonfasting glucose 113, BUN of 19, serum creatinine of 1.29, EGFR greater than 60, alk phos was elevated at 139, AST 50, ALT of 69.  WBC 10.4, hemoglobin 15.2, platelets of 349.  Lactic acid was 3.8 and on repeat was 2.1.  High sensitive troponin was 20, pending second collection.  Repeat blood cultures x 2 are in process.  ED treatment: Sodium chloride 1 L bolus x 2.

## 2023-05-09 NOTE — Assessment & Plan Note (Signed)
Resumed cefdinir 300 mg p.o. every 12 hours and Bactrim 800-160 every 12 hours p.o.

## 2023-05-09 NOTE — Assessment & Plan Note (Signed)
-   This complicates overall care and prognosis.  

## 2023-05-09 NOTE — Assessment & Plan Note (Signed)
Resumed home acyclovir 400 mg p.o. twice daily

## 2023-05-09 NOTE — Assessment & Plan Note (Addendum)
Fall precaution, aspiration precaution I suspect patient had a vasovagal event, in setting of mild dehydration from lack of p.o. intake prior to discharge Orthostatic vital signs ordered LR 125 mL/h, 1 day ordered Admit to telemetry cardiac, observation

## 2023-05-09 NOTE — ED Triage Notes (Signed)
Pt was leaving the hospital after being discharged from upstairs.  Upon getting to the lobby pt had syncopal episode.  Rapid response called. Brought to the ED alert but lethargic and extremely diaphoretic.  EDP at bedside.

## 2023-05-09 NOTE — Assessment & Plan Note (Signed)
Versus lab error, given serum creatinine from labs at 04:58 on day of discharge/admission was 0.94 Repeat BMP in the a.m.

## 2023-05-09 NOTE — Discharge Summary (Signed)
Physician Discharge Summary   Patient: Randy [name]. MRN: [id number] DOB: December 28, 1980  Admit date:     05/05/2023  Discharge date: 05/09/23  Discharge Physician: Alberteen Sam   PCP: Dale Adrian, MD     Recommendations at discharge:  Follow-up with PCP Dr. Lorin Picket in 1 week Follow up with Oncology Dr. Cathie Hoops as planned Dr. 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 Ducksworth is a 42 year old M with history of seizures, anaplastic large cell lymphoma, who presented after chemotherapy for malaise, fatigue.  In the ER, tachycardic, white blood cell count 55 K, lactic acid elevated.  CXR clear, CT abdomen and pelvis unremarkable.  Started on broad spectrum antibiotics and admitted.     * Sepsis ([name]) Dental caries with dental pain vs ostealgia Possible pegfilgrastin adverse effect (ostealgia and leukocytosis of 55,000) Blood and urine cultures no growth.  CT maxillofacial showed no abscess, but the most likely source of his infection was odontogenic.  He was treated with Vanco, Rocephin, and Flagyl, transition to cefdinir and Bactrim and should complete a 7-day course.  By the time of discharge, he was asymptomatic, afebrile for over 24 hours, tolerating oral intake well and white blood cell count resolved to normal.     Transaminitis LFTs slightly elevated on day of discharge.  Nonspecific.  Absolutely no RUQ symptoms, nothing localizing.  Recommend PCP repeat in 1 week to verify resolution.    Hypertension Coreg added.  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 Cathie Hoops             The [facility] Controlled Substances Registry was reviewed for this patient prior to discharge.   Consultants: None Procedures performed: CT abdomen and pelvis with contrast  Disposition: Home Diet recommendation:  Regular diet  DISCHARGE MEDICATION: Allergies as of 05/09/2023   No Known Allergies      Medication List     TAKE these medications    acyclovir 400 MG tablet Commonly known as: ZOVIRAX Take 1 tablet (400 mg total) by mouth 2 (two) times daily.   carvedilol 3.125 MG tablet Commonly known as: COREG Take 1 tablet (3.125 mg total) by mouth 2 (two) times daily with a meal.   cefdinir 300 MG capsule Commonly known as: OMNICEF Take 1 capsule (300 mg total) by mouth every 12 (twelve) hours.   chlorhexidine 0.12 % solution Commonly known as: Peridex Use as directed 15 mLs in the mouth or throat 2 (two) times daily.   ibuprofen 200 MG tablet Commonly known as: ADVIL Take 400 mg by mouth every 6 (six) hours as needed for moderate pain.   lidocaine-prilocaine cream Commonly known as: EMLA Apply to affected area once   ondansetron 8 MG tablet Commonly known as: Zofran Take 1 tablet (8 mg total) by mouth every 8 (eight) hours as needed for nausea or vomiting. Start on the third day after cyclophosphamide chemotherapy.   predniSONE 20 MG tablet Commonly known as: DELTASONE Take 5 tablets (100 mg total) by mouth daily. Take on days 2-5 of chemotherapy.   prochlorperazine 10 MG tablet Commonly known as: COMPAZINE Take 1 tablet (10 mg total) by  mouth every 6 (six) hours as needed for nausea or vomiting.   sulfamethoxazole-trimethoprim 800-160 MG tablet Commonly known as: BACTRIM DS Take 1 tablet by mouth every 12 (twelve) hours.   traMADol 50 MG tablet Commonly known as: Ultram Take 1 tablet (50 mg total) by mouth every 6 (six) hours as needed. What changed: reasons to take this   valsartan-hydrochlorothiazide 80-12.5 MG tablet Commonly known  as: DIOVAN-HCT Take 1 tablet by mouth daily.        Follow-up Information     Dale St. Nazianz, MD. Schedule an appointment as soon as possible for a visit in 1 week(s).   Specialty: Internal Medicine Why: or [facility] information: [address] [place] [address] [phone]                 Discharge Instructions     Discharge instructions   Complete by: As directed    **IMPORTANT DISCHARGE INSTRUCTIONS**   From Dr. Maryfrances Bunnell: You were admitted for what appeared to be an infection None of your cultures grew anything, and so we are not 100% sure where the infection was  Finishe antibiotics with Bactrim (trimethoprim-sulfamethoxazole) and Omnicef (cefdinir) Take both twice daily starting tongith Take for 3 more days, then stop  Go see your primary care at [facility] station in 1 week Ask them to check your liver tests  Return if you have worsening fever, new pain that is severe or new feeling like you are too weak to stand.   Increase activity slowly   Complete by: As directed        Discharge Exam: Filed Weights   05/05/23 2255 05/06/23 2043  Weight: 113.4 kg 113.4 kg    General: Pt is alert, awake, not in acute distress Cardiovascular: RRR, nl S1-S2, no murmurs appreciated.   No LE edema.   Respiratory: Normal respiratory rate and rhythm.  CTAB without rales or wheezes. Abdominal: Abdomen soft and non-tender.  No distension or HSM.   Neuro/Psych: Strength symmetric in upper and lower extremities.  Judgment and insight appear normal.   Condition at discharge: good  The results of significant diagnostics from this hospitalization (including imaging, microbiology, ancillary and laboratory) are listed below for reference.   Imaging Studies: CT ABDOMEN PELVIS W CONTRAST  Result Date: 05/06/2023 CLINICAL DATA:  History of lymphoma with recent chemotherapy and increasing nausea  and vomiting, initial encounter EXAM: CT ABDOMEN AND PELVIS WITH CONTRAST TECHNIQUE: Multidetector CT imaging of the abdomen and pelvis was performed using the standard protocol following bolus administration of intravenous contrast. RADIATION DOSE REDUCTION: This exam was performed according to the departmental dose-optimization program which includes automated exposure control, adjustment of the mA and/or kV according to patient size and/or use of iterative reconstruction technique. CONTRAST:  OMNIPAQUE IOHEXOL 300 MG/ML  SOLN COMPARISON:  03/14/2023 PET-CT FINDINGS: Lower chest: No acute abnormality. Hepatobiliary: No focal liver abnormality is seen. No gallstones, gallbladder wall thickening, or biliary dilatation. Pancreas: Unremarkable. No pancreatic ductal dilatation or surrounding inflammatory changes. Spleen: Normal in size without focal abnormality. Adrenals/Urinary Tract: Adrenal glands are within normal limits. Kidneys are well visualized within normal enhancement pattern. Small stable cyst is noted in the left kidney. No follow-up is recommended. No obstructive changes are seen. The bladder is partially distended. Stomach/Bowel: The appendix is well visualized and within normal limits. No obstructive or inflammatory changes of the colon are seen. Stomach and small bowel are within normal limits. Vascular/Lymphatic: Atherosclerotic changes  are noted. Left inguinal lymph node is noted measuring 2.1 x 1.7 cm. This is decreased in the interval from the prior PET-CT at which time it measured up to 2.8 cm. Reproductive: Prostate is unremarkable. Other: Stable fat containing left inguinal hernia is noted. No abdominopelvic ascites. Musculoskeletal: No acute or significant osseous findings. IMPRESSION: Left inguinal lymph node slightly decreased in size when compared with the prior PET-CT. No acute abnormality is identified to correspond with the given clinical history. Electronically Signed   By: Alcide Clever M.D.   On: 05/06/2023 01:17   CT Maxillofacial W Contrast  Result Date: 05/06/2023 CLINICAL DATA:  Right mandibular molar fracture EXAM: CT MAXILLOFACIAL WITH CONTRAST TECHNIQUE: Multidetector CT imaging of the maxillofacial structures was performed with intravenous contrast. Multiplanar CT image reconstructions were also generated. RADIATION DOSE REDUCTION: This exam was performed according to the departmental dose-optimization program which includes automated exposure control, adjustment of the mA and/or kV according to patient size and/or use of iterative reconstruction technique. CONTRAST:  OMNIPAQUE IOHEXOL 300 MG/ML  SOLN COMPARISON:  None Available. FINDINGS: Osseous: Poor dentition with multiple periapical lucencies. There are caries of the most posterior 2 remaining mandibular molars. Periapical lucency at the root of tooth 8 extends to the incisive foramen. No dental abscess or facial inflammation. Orbits: Negative. No traumatic or inflammatory finding. Sinuses: Clear. Soft tissues: Negative. Limited intracranial: No significant or unexpected finding. IMPRESSION: Poor dentition with multiple periapical lucencies and dental caries, including of the most posterior 2 remaining mandibular molars. No dental abscess or facial inflammation. Electronically Signed   By: Deatra Robinson M.D.   On: 05/06/2023 01:02   DG Chest 2 View  Result Date: 05/06/2023 CLINICAL DATA:  Nausea and vomiting. EXAM: CHEST - 2 VIEW COMPARISON:  April 11, 2023 FINDINGS: There is stable right-sided venous Port-A-Cath positioning. The heart size and mediastinal contours are within normal limits. Both lungs are clear. The visualized skeletal structures are unremarkable. IMPRESSION: No active cardiopulmonary disease. Electronically Signed   By: Aram Candela M.D.   On: 05/06/2023 00:14   DG Chest Port 1 View  Result Date: 04/11/2023 CLINICAL DATA:  Port-A-Cath placement. EXAM: PORTABLE CHEST 1 VIEW COMPARISON:   Chest radiographs 04/20/2017.  PET-03/14/2023. FINDINGS: The cardiomediastinal silhouette is accentuated by portable AP technique and low lung volumes. A right jugular Port-A-Cath has been placed and terminates near the superior cavoatrial junction. External artifact is noted over the right lung base and lower mediastinum. Within this limitation, no definite airspace consolidation, overt pulmonary edema, sizable pleural effusion, or pneumothorax is identified. No acute osseous abnormality is seen. IMPRESSION: Right jugular Port-A-Cath as above. Low lung volumes. Electronically Signed   By: Sebastian Ache M.D.   On: 04/11/2023 11:03   DG C-Arm 1-60 Min-No Report  Result Date: 04/11/2023 Fluoroscopy was utilized by the requesting physician.  No radiographic interpretation.    Microbiology: Results for orders placed or performed during the hospital encounter of 05/05/23  SARS Coronavirus 2 by RT PCR (hospital order, performed in [facility] hospital lab) *cepheid single result test* Anterior Nasal Swab     Status: None   Collection Time: 05/05/23 11:23 PM   Specimen: Anterior Nasal Swab  Result Value Ref Range Status   SARS Coronavirus 2 by RT PCR NEGATIVE NEGATIVE Final    Comment: (NOTE) SARS-CoV-2 target nucleic acids are NOT DETECTED.  The SARS-CoV-2 RNA is generally detectable in upper and lower respiratory specimens during the acute phase of infection. The lowest concentration  of SARS-CoV-2 viral copies this assay can detect is 250 copies / mL. A negative result does not preclude SARS-CoV-2 infection and should not be used as the sole basis for treatment or other patient management decisions.  A negative result may occur with improper specimen collection / handling, submission of specimen other than nasopharyngeal swab, presence of viral mutation(s) within the areas targeted by this assay, and inadequate number of viral copies (<250 copies / mL). A negative result must be combined with  clinical observations, patient history, and epidemiological information.  Fact Sheet for Patients:   RoadLapTop.co.za  Fact Sheet for Healthcare Providers: http://kim-miller.com/  This test is not yet approved or  cleared by the [place] FDA and has been authorized for detection and/or diagnosis of SARS-CoV-2 by FDA under an Emergency Use Authorization (EUA).  This EUA will remain in effect (meaning this test can be used) for the duration of the COVID-19 declaration under Section 564(b)(1) of the Act, 21 U.S.C. section 360bbb-3(b)(1), unless the authorization is terminated or revoked sooner.  Performed at [facility], [address] [place], [place] [address]   Blood culture (routine x 2)     Status: None (Preliminary result)   Collection Time: 05/06/23 12:36 AM   Specimen: BLOOD  Result Value Ref Range Status   Specimen Description BLOOD PORTA CATH  Final   Special Requests   Final    BOTTLES DRAWN AEROBIC AND ANAEROBIC Blood Culture adequate volume   Culture   Final    NO GROWTH 2 DAYS Performed at [facility], [address], [place], [place] [address]    Report Status PENDING  Incomplete  Blood culture (routine x 2)     Status: None (Preliminary result)   Collection Time: 05/06/23 12:41 AM   Specimen: BLOOD  Result Value Ref Range Status   Specimen Description BLOOD BLOOD RIGHT ARM  Final   Special Requests   Final    BOTTLES DRAWN AEROBIC AND ANAEROBIC Blood Culture adequate volume   Culture   Final    NO GROWTH 2 DAYS Performed at [facility], [address] [place], [place] [address]    Report Status PENDING  Incomplete  MRSA Next Gen by PCR, Nasal     Status: None   Collection Time: 05/08/23 11:40 AM   Specimen: Nasal Mucosa; Nasal Swab  Result Value Ref Range Status   MRSA by PCR Next Gen NOT DETECTED NOT DETECTED Final    Comment: (NOTE) The GeneXpert MRSA Assay (FDA  approved for NASAL specimens only), is one component of a comprehensive MRSA colonization surveillance program. It is not intended to diagnose MRSA infection nor to guide or monitor treatment for MRSA infections. Test performance is not FDA approved in patients less than 66 years old. Performed at [facility], [address] [place], [place] [address]     Labs: CBC: Recent Labs  Lab 05/05/23 2307 05/06/23 0430 05/07/23 0449 05/08/23 0450 05/09/23 0458  WBC 55.6* 53.9*  53.6* 46.4* 16.5* 5.9  NEUTROABS 42.6* 48.5* 30.8* 11.0* 3.9  HGB 14.0 13.6  13.4 14.3 14.0 15.0  HCT 42.1 41.8  40.5 42.4 40.8 44.1  MCV 85.2 88.6  85.6 84.3 84.5 83.1  PLT 438* 422*  403* 367 336 299   Basic Metabolic Panel: Recent Labs  Lab 05/03/23 0845 05/05/23 2307 05/06/23 1022 05/07/23 0449 05/08/23 0450 05/09/23 0458  NA 139 137  --  139 137 137  K 3.6 3.2*  --  3.7 3.5 3.5  CL 107 102  --  103 102 100  CO2 24 24  --  27 28 27   GLUCOSE 108* 100*  --  110* 97 100*  BUN 18 16  --  16 20 20   CREATININE 1.22 0.96  --  0.92 0.94 0.94  CALCIUM 9.0 9.1  --  9.2 8.9 8.9  MG  --   --  2.1 2.3 2.2 2.3  PHOS  --   --  2.6 4.7* 4.2 5.0*   Liver Function Tests: Recent Labs  Lab 05/03/23 0845 05/05/23 2307 05/07/23 0449 05/08/23 0450 05/09/23 0458  AST 27 37 27 22 50*  ALT 40 39 30 25 65*  ALKPHOS 89 120 156* 144* 134*  BILITOT 0.4 0.7 1.0 0.9 0.7  PROT 6.7 7.1 7.0 6.8 6.8  ALBUMIN 4.0 4.2 4.0 3.8 4.0   CBG: No results for input(s): "GLUCAP" in the last 168 hours.  Discharge time spent: approximately 35 minutes spent on discharge counseling, evaluation of patient on day of discharge, and coordination of discharge planning with nursing, social work, pharmacy and case management  Signed: Alberteen Sam, MD [facility] 05/09/2023

## 2023-05-10 ENCOUNTER — Encounter: Payer: Self-pay | Admitting: Internal Medicine

## 2023-05-10 LAB — CBC
HCT: 41.9 % (ref 39.0–52.0)
Hemoglobin: 14.3 g/dL (ref 13.0–17.0)
MCH: 28.7 pg (ref 26.0–34.0)
MCHC: 34.1 g/dL (ref 30.0–36.0)
MCV: 84.1 fL (ref 80.0–100.0)
Platelets: 284 10*3/uL (ref 150–400)
RBC: 4.98 MIL/uL (ref 4.22–5.81)
RDW: 15.1 % (ref 11.5–15.5)
WBC: 7.5 10*3/uL (ref 4.0–10.5)
nRBC: 0 % (ref 0.0–0.2)

## 2023-05-10 LAB — BASIC METABOLIC PANEL
Anion gap: 7 (ref 5–15)
BUN: 15 mg/dL (ref 6–20)
CO2: 27 mmol/L (ref 22–32)
Calcium: 9.1 mg/dL (ref 8.9–10.3)
Chloride: 102 mmol/L (ref 98–111)
Creatinine, Ser: 1.01 mg/dL (ref 0.61–1.24)
GFR, Estimated: >60 mL/min (ref >60)
Glucose, Bld: 106 mg/dL — ABNORMAL HIGH (ref 70–99)
Potassium: 3.6 mmol/L (ref 3.5–5.1)
Sodium: 136 mmol/L (ref 135–145)

## 2023-05-10 LAB — HEPATIC FUNCTION PANEL
ALT: 95 U/L — ABNORMAL HIGH (ref 0–44)
AST: 61 U/L — ABNORMAL HIGH (ref 15–41)
Albumin: 3.7 g/dL (ref 3.5–5.0)
Alkaline Phosphatase: 130 U/L — ABNORMAL HIGH (ref 38–126)
Bilirubin, Direct: 0.1 mg/dL (ref 0.0–0.2)
Indirect Bilirubin: 0.8 mg/dL (ref 0.3–0.9)
Total Bilirubin: 0.9 mg/dL (ref 0.3–1.2)
Total Protein: 6.5 g/dL (ref 6.5–8.1)

## 2023-05-10 LAB — CULTURE, BLOOD (ROUTINE X 2)
Special Requests: ADEQUATE
Special Requests: ADEQUATE

## 2023-05-10 LAB — CBG MONITORING, ED: Glucose-Capillary: 100 mg/dL — ABNORMAL HIGH (ref 70–99)

## 2023-05-10 MED ORDER — AMOXICILLIN-POT CLAVULANATE 875-125 MG PO TABS
1.0000 | ORAL_TABLET | Freq: Two times a day (BID) | ORAL | Status: DC
  Administered 2023-05-10: 1 via ORAL
  Filled 2023-05-10: qty 1

## 2023-05-10 MED ORDER — AMOXICILLIN-POT CLAVULANATE 875-125 MG PO TABS: 1.0000 | ORAL_TABLET | Freq: Two times a day (BID) | ORAL | 0 refills | Status: DC

## 2023-05-10 MED ORDER — HEPARIN SOD (PORK) LOCK FLUSH 10 UNIT/ML IV SOLN
10.0000 [IU] | Freq: Once | INTRAVENOUS | Status: DC
  Filled 2023-05-10: qty 5

## 2023-05-10 MED ORDER — HEPARIN SOD (PORK) LOCK FLUSH 100 UNIT/ML IV SOLN
500.0000 [IU] | Freq: Once | INTRAVENOUS | Status: AC
  Administered 2023-05-10: 500 [IU] via INTRAVENOUS
  Filled 2023-05-10: qty 5

## 2023-05-10 NOTE — Discharge Summary (Signed)
Physician Discharge Summary   Patient: Randy [name]. 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 M with history of seizures, anaplastic large cell lymphoma, and recent admission for sepsis unknown source who presented with syncope.  Patient recently been admitted for 3 days for suspected sepsis, possible odontogenic source, treated with antibiotics and improved.  On the day of discharge, the patient's laboratory studies had resolved to near normal, he said he felt well, his appetite was good, he ambulated with nursing without symptoms.  While being taken to front in wheelchair, patient started to have prodrome "felt hot" and then passed out briefly.  He was immediately assessed and found to have BP 80 mmHg systolic and wheeled back to the ER.        * Vasovagal syncope due to antihypertensives and anorexia Patient had not eaten breakfast, and had been given all three of his new BP meds.  Although lactic acid was elevated, this is nonspecific, and patient rapidly returned to normal and felt normal overnight without new symptoms.  No hyperkalemia to suggest TLS.  No fever, constitutional symptoms, leukocytosis to suggest infection.  Suspect the lactate is due to poor PO intake.     Elevated LFTs Given timing of onset during last admission, I think viral hepatitis  unlikely.  Given degree, I think ischemia unlikely.  Given absence of abdominal pain, Budd Chiari unlikely.  Given no fever, abdominal pain, constitutional symptoms, cholangitis/cholecystitis unlikely.  Discussed with pharmacy who suspect Bactrim. - Stop Bactrim, replace with Augmentin, see below  Dental caries Discussed with pharmacy who recommended better oral coverage with Augmentin.  Hypertension Had recently been prescribed new ACEi-HCT which he had not started prior to admission.  Here, he was started on the ACE and HCTZ and carvedilol was added due to persistently elevated BP.  Coreg stopped at discharge.  Recommended holding new ACEi-HCTZ until PCP follow up  Hypokalemia Treated and resolved.            The [name] Controlled Substances Registry was reviewed for this patient prior to discharge.  Consultants: None Procedures performed: None, Ultrasound liver recommended, patient deferred  Disposition: Home Diet recommendation:  Regular diet  DISCHARGE MEDICATION: Allergies as of 05/10/2023   No Known Allergies      Medication List     STOP taking these medications    carvedilol 3.125 MG tablet Commonly known as: COREG   cefdinir 300 MG capsule Commonly known as: OMNICEF   sulfamethoxazole-trimethoprim 800-160 MG tablet Commonly known as: BACTRIM DS   valsartan-hydrochlorothiazide 80-12.5 MG tablet Commonly known as: DIOVAN-HCT       TAKE these medications    acyclovir 400 MG tablet Commonly known as: ZOVIRAX Take 1 tablet (400 mg total) by mouth 2 (two) times daily.   amoxicillin-clavulanate 875-125 MG tablet Commonly known as: AUGMENTIN Take  1 tablet by mouth every 12 (twelve) hours.   chlorhexidine 0.12 % solution Commonly known as: Peridex Use as directed 15 mLs in the mouth or throat 2 (two) times daily.   ibuprofen 200 MG tablet Commonly known as: ADVIL Take 400 mg by mouth every 6 (six) hours as needed for moderate pain.    lidocaine-prilocaine cream Commonly known as: EMLA Apply to affected area once What changed:  how much to take how to take this when to take this   ondansetron 8 MG tablet Commonly known as: Zofran Take 1 tablet (8 mg total) by mouth every 8 (eight) hours as needed for nausea or vomiting. Start on the third day after cyclophosphamide chemotherapy.   predniSONE 20 MG tablet Commonly known as: DELTASONE Take 5 tablets (100 mg total) by mouth daily. Take on days 2-5 of chemotherapy.   prochlorperazine 10 MG tablet Commonly known as: COMPAZINE Take 1 tablet (10 mg total) by mouth every 6 (six) hours as needed for nausea or vomiting.   traMADol 50 MG tablet Commonly known as: Ultram Take 1 tablet (50 mg total) by mouth every 6 (six) hours as needed. What changed: reasons to take this        Follow-up Information     Randy Dies, NP. Schedule an appointment as soon as possible for a visit in 1 week(s).   Specialties: Nurse Practitioner, Family Medicine Contact information: [address] [place] Sm[place] [address] [place] [address] [phone]         Randy Patience, MD. Go to.   Specialty: Oncology Why: as planned Contact information: [address] [place] [address] [phone]                 Discharge Instructions     Discharge instructions   Complete by: As directed    **IMPORTANT DISCHARGE INSTRUCTIONS**   From Randy [name]: You were observed overnight for passing out. Here, we found that your blood pressure was low, and we suspect that the passing out was the result of the new blood pressure medicines, combined with not eating.  For now: HOLD (do not take) your home valsartan-hydrochlorothiazide/HCTZ pill (the new blood pressure medicine your PCP gave you)  You will LIKELY need to restart this in a week, when you see them  DO NOT take the new Coreg/carvedilol, I will call the pharmacy to cancel this   For the infection: You need to  finish the course of antibiotics Since your liver function tests haven't returned to normal, I want you to change your antibiotics to another medicine Augmentin (amoxicillin-clavulanate)  For the next few days, take Augmentin twice daily, starting this evening  PUSH Fluids.  Drink lots of fluids  Have Charanpreet Kaur's office (the new doctor you have, Randy [name]) check your liver function tests in 1 week  Resume your other medicines as before   Increase activity slowly   Complete by: As directed        Discharge Exam: There were no vitals filed for this visit.  General: Pt is alert, awake, not in acute distress Cardiovascular: RRR, nl S1-S2, no murmurs appreciated.   No LE edema.   Respiratory: Normal respiratory rate and rhythm.  CTAB without rales or wheezes. Abdominal: Abdomen soft and non-tender.  No distension or HSM.   Neuro/Psych: Strength symmetric in upper and lower extremities.  Judgment and insight appear normal.   Condition at discharge: good  The results of significant diagnostics from this hospitalization (including imaging, microbiology, ancillary  and laboratory) are listed below for reference.   Imaging Studies: X-ray chest PA and lateral  Result Date: 05/09/2023 CLINICAL DATA:  Syncope EXAM: CHEST - 2 VIEW COMPARISON:  05/05/2023 FINDINGS: Right-sided central venous port tip over the cavoatrial region. No acute airspace disease or effusion. Stable cardiomediastinal silhouette. No pneumothorax. IMPRESSION: No active cardiopulmonary disease. Electronically Signed   By: Jasmine Pang M.D.   On: 05/09/2023 15:21   CT ABDOMEN PELVIS W CONTRAST  Result Date: 05/06/2023 CLINICAL DATA:  History of lymphoma with recent chemotherapy and increasing nausea and vomiting, initial encounter EXAM: CT ABDOMEN AND PELVIS WITH CONTRAST TECHNIQUE: Multidetector CT imaging of the abdomen and pelvis was performed using the standard protocol following bolus administration of  intravenous contrast. RADIATION DOSE REDUCTION: This exam was performed according to the departmental dose-optimization program which includes automated exposure control, adjustment of the mA and/or kV according to patient size and/or use of iterative reconstruction technique. CONTRAST:  OMNIPAQUE IOHEXOL 300 MG/ML  SOLN COMPARISON:  03/14/2023 PET-CT FINDINGS: Lower chest: No acute abnormality. Hepatobiliary: No focal liver abnormality is seen. No gallstones, gallbladder wall thickening, or biliary dilatation. Pancreas: Unremarkable. No pancreatic ductal dilatation or surrounding inflammatory changes. Spleen: Normal in size without focal abnormality. Adrenals/Urinary Tract: Adrenal glands are within normal limits. Kidneys are well visualized within normal enhancement pattern. Small stable cyst is noted in the left kidney. No follow-up is recommended. No obstructive changes are seen. The bladder is partially distended. Stomach/Bowel: The appendix is well visualized and within normal limits. No obstructive or inflammatory changes of the colon are seen. Stomach and small bowel are within normal limits. Vascular/Lymphatic: Atherosclerotic changes are noted. Left inguinal lymph node is noted measuring 2.1 x 1.7 cm. This is decreased in the interval from the prior PET-CT at which time it measured up to 2.8 cm. Reproductive: Prostate is unremarkable. Other: Stable fat containing left inguinal hernia is noted. No abdominopelvic ascites. Musculoskeletal: No acute or significant osseous findings. IMPRESSION: Left inguinal lymph node slightly decreased in size when compared with the prior PET-CT. No acute abnormality is identified to correspond with the given clinical history. Electronically Signed   By: Alcide Clever M.D.   On: 05/06/2023 01:17   CT Maxillofacial W Contrast  Result Date: 05/06/2023 CLINICAL DATA:  Right mandibular molar fracture EXAM: CT MAXILLOFACIAL WITH CONTRAST TECHNIQUE: Multidetector CT  imaging of the maxillofacial structures was performed with intravenous contrast. Multiplanar CT image reconstructions were also generated. RADIATION DOSE REDUCTION: This exam was performed according to the departmental dose-optimization program which includes automated exposure control, adjustment of the mA and/or kV according to patient size and/or use of iterative reconstruction technique. CONTRAST:  OMNIPAQUE IOHEXOL 300 MG/ML  SOLN COMPARISON:  None Available. FINDINGS: Osseous: Poor dentition with multiple periapical lucencies. There are caries of the most posterior 2 remaining mandibular molars. Periapical lucency at the root of tooth 8 extends to the incisive foramen. No dental abscess or facial inflammation. Orbits: Negative. No traumatic or inflammatory finding. Sinuses: Clear. Soft tissues: Negative. Limited intracranial: No significant or unexpected finding. IMPRESSION: Poor dentition with multiple periapical lucencies and dental caries, including of the most posterior 2 remaining mandibular molars. No dental abscess or facial inflammation. Electronically Signed   By: Deatra Robinson M.D.   On: 05/06/2023 01:02   DG Chest 2 View  Result Date: 05/06/2023 CLINICAL DATA:  Nausea and vomiting. EXAM: CHEST - 2 VIEW COMPARISON:  April 11, 2023 FINDINGS: There is stable right-sided venous Port-A-Cath positioning. The  heart size and mediastinal contours are within normal limits. Both lungs are clear. The visualized skeletal structures are unremarkable. IMPRESSION: No active cardiopulmonary disease. Electronically Signed   By: Aram Candela M.D.   On: 05/06/2023 00:14   DG Chest Port 1 View  Result Date: 04/11/2023 CLINICAL DATA:  Port-A-Cath placement. EXAM: PORTABLE CHEST 1 VIEW COMPARISON:  Chest radiographs 04/20/2017.  PET-03/14/2023. FINDINGS: The cardiomediastinal silhouette is accentuated by portable AP technique and low lung volumes. A right jugular Port-A-Cath has been placed and  terminates near the superior cavoatrial junction. External artifact is noted over the right lung base and lower mediastinum. Within this limitation, no definite airspace consolidation, overt pulmonary edema, sizable pleural effusion, or pneumothorax is identified. No acute osseous abnormality is seen. IMPRESSION: Right jugular Port-A-Cath as above. Low lung volumes. Electronically Signed   By: Sebastian Ache M.D.   On: 04/11/2023 11:03   DG C-Arm 1-60 Min-No Report  Result Date: 04/11/2023 Fluoroscopy was utilized by the requesting physician.  No radiographic interpretation.    Microbiology: Results for orders placed or performed during the hospital encounter of 05/09/23  Culture, blood (routine x 2)     Status: None (Preliminary result)   Collection Time: 05/09/23 12:24 PM   Specimen: BLOOD  Result Value Ref Range Status   Specimen Description BLOOD RIGHT CHEST  Final   Special Requests   Final    BOTTLES DRAWN AEROBIC AND ANAEROBIC Blood Culture results may not be optimal due to an inadequate volume of blood received in culture bottles   Culture   Final    NO GROWTH < 24 HOURS Performed at [name], [address] [place], [place] [address]    Report Status PENDING  Incomplete  Culture, blood (routine x 2)     Status: None (Preliminary result)   Collection Time: 05/09/23  1:06 PM   Specimen: BLOOD  Result Value Ref Range Status   Specimen Description BLOOD LEFT ANTECUBITAL  Final   Special Requests   Final    BOTTLES DRAWN AEROBIC ONLY Blood Culture adequate volume   Culture   Final    NO GROWTH < 24 HOURS Performed at [name], [address] [place], [place] [address]    Report Status PENDING  Incomplete    Labs: CBC: Recent Labs  Lab 05/06/23 0430 05/07/23 0449 05/08/23 0450 05/09/23 0458 05/09/23 1223 05/10/23 0549  WBC 53.9*  53.6* 46.4* 16.5* 5.9 10.4 7.5  NEUTROABS 48.5* 30.8* 11.0* 3.9 7.2  --   HGB 13.6  13.4 14.3 14.0 15.0 15.2  14.3  HCT 41.8  40.5 42.4 40.8 44.1 45.2 41.9  MCV 88.6  85.6 84.3 84.5 83.1 83.2 84.1  PLT 422*  403* 367 336 299 349 284   Basic Metabolic Panel: Recent Labs  Lab 05/06/23 1022 05/07/23 0449 05/08/23 0450 05/09/23 0458 05/09/23 1223 05/09/23 1555 05/10/23 0549  NA  --  139 137 137 135  --  136  K  --  3.7 3.5 3.5 3.2*  --  3.6  CL  --  103 102 100 100  --  102  CO2  --  27 28 27 24   --  27  GLUCOSE  --  110* 97 100* 113*  --  106*  BUN  --  16 20 20 19   --  15  CREATININE  --  0.92 0.94 0.94 1.29*  --  1.01  CALCIUM  --  9.2 8.9 8.9 9.0  --  9.1  MG 2.1 2.3 2.2 2.3  --  2.1  --   PHOS 2.6 4.7* 4.2 5.0*  --   --   --    Liver Function Tests: Recent Labs  Lab 05/07/23 0449 05/08/23 0450 05/09/23 0458 05/09/23 1223 05/10/23 0549  AST 27 22 50* 50* 61*  ALT 30 25 65* 69* 95*  ALKPHOS 156* 144* 134* 139* 130*  BILITOT 1.0 0.9 0.7 1.0 0.9  PROT 7.0 6.8 6.8 7.0 6.5  ALBUMIN 4.0 3.8 4.0 4.1 3.7   CBG: Recent Labs  Lab 05/09/23 1200 05/10/23 0547  GLUCAP 99 100*    Discharge time spent: approximately 25 minutes spent on discharge counseling, evaluation of patient on day of discharge, and coordination of discharge planning with nursing, social work, pharmacy and case management  Signed: Alberteen Sam, MD [facility] 05/10/2023

## 2023-05-11 LAB — CULTURE, BLOOD (ROUTINE X 2)
Culture: NO GROWTH
Special Requests: ADEQUATE

## 2023-05-12 LAB — CULTURE, BLOOD (ROUTINE X 2)

## 2023-05-13 LAB — CULTURE, BLOOD (ROUTINE X 2): Culture: NO GROWTH

## 2023-05-14 LAB — CULTURE, BLOOD (ROUTINE X 2): Culture: NO GROWTH

## 2023-05-21 DIAGNOSIS — L309 Dermatitis, unspecified: Secondary | ICD-10-CM | POA: Insufficient documentation

## 2023-05-21 DIAGNOSIS — L409 Psoriasis, unspecified: Secondary | ICD-10-CM | POA: Insufficient documentation

## 2023-05-21 NOTE — Assessment & Plan Note (Signed)
Followed by oncologist Dr.Zhou.

## 2023-05-21 NOTE — Assessment & Plan Note (Signed)
Advised patient to keep the skin hydrated. Use over-the-counter HCTZ for pruritus. Will continue to monitor

## 2023-05-21 NOTE — Assessment & Plan Note (Signed)
Body mass index is 34.01 kg/m. Advised pt to lose weight. Advised patient to avoid trans fat, fatty and fried food. Follow a regular physical activity schedule.

## 2023-05-21 NOTE — Assessment & Plan Note (Signed)
Patient BP  Vitals:   05/04/23 1401  BP: (!) 154/92    in the office. Advised pt to follow a low sodium and heart healthy diet. Advised patient to check blood pressure at home and bring readings to the next appointment. Will start on valsartan HCTZ

## 2023-05-22 ENCOUNTER — Other Ambulatory Visit: Payer: 59

## 2023-05-23 ENCOUNTER — Telehealth: Payer: Self-pay | Admitting: Oncology

## 2023-05-23 ENCOUNTER — Other Ambulatory Visit: Payer: Self-pay | Admitting: Oncology

## 2023-05-23 DIAGNOSIS — C8475 Anaplastic large cell lymphoma, ALK-negative, lymph nodes of inguinal region and lower limb: Secondary | ICD-10-CM

## 2023-05-23 MED FILL — Fosaprepitant Dimeglumine For IV Infusion 150 MG (Base Eq): INTRAVENOUS | Qty: 5 | Status: AC

## 2023-05-23 MED FILL — Dexamethasone Sodium Phosphate Inj 100 MG/10ML: INTRAMUSCULAR | Qty: 1 | Status: AC

## 2023-05-23 NOTE — Telephone Encounter (Signed)
Vm left with patient to make him aware that we will not be able to give him his scheduled infusion tomorrow(5/30) due to pharmacy supply issue. Requested that he please call back to confirm and discuss further.

## 2023-05-24 ENCOUNTER — Inpatient Hospital Stay: Payer: 59

## 2023-05-24 ENCOUNTER — Inpatient Hospital Stay: Payer: 59 | Admitting: Oncology

## 2023-05-24 MED FILL — Dexamethasone Sodium Phosphate Inj 100 MG/10ML: INTRAMUSCULAR | Qty: 1 | Status: AC

## 2023-05-24 MED FILL — Fosaprepitant Dimeglumine For IV Infusion 150 MG (Base Eq): INTRAVENOUS | Qty: 5 | Status: AC

## 2023-05-25 ENCOUNTER — Other Ambulatory Visit: Payer: Self-pay

## 2023-05-25 ENCOUNTER — Inpatient Hospital Stay: Payer: 59

## 2023-05-25 ENCOUNTER — Inpatient Hospital Stay (HOSPITAL_BASED_OUTPATIENT_CLINIC_OR_DEPARTMENT_OTHER): Payer: 59 | Admitting: Oncology

## 2023-05-25 ENCOUNTER — Encounter: Payer: Self-pay | Admitting: Oncology

## 2023-05-25 VITALS — BP 144/93 | HR 93 | Temp 98.2°F | Resp 18 | Wt 260.6 lb

## 2023-05-25 DIAGNOSIS — Z5189 Encounter for other specified aftercare: Secondary | ICD-10-CM | POA: Diagnosis not present

## 2023-05-25 DIAGNOSIS — T451X5A Adverse effect of antineoplastic and immunosuppressive drugs, initial encounter: Secondary | ICD-10-CM | POA: Diagnosis not present

## 2023-05-25 DIAGNOSIS — C8475 Anaplastic large cell lymphoma, ALK-negative, lymph nodes of inguinal region and lower limb: Secondary | ICD-10-CM

## 2023-05-25 DIAGNOSIS — Z5112 Encounter for antineoplastic immunotherapy: Secondary | ICD-10-CM | POA: Diagnosis not present

## 2023-05-25 DIAGNOSIS — R110 Nausea: Secondary | ICD-10-CM

## 2023-05-25 DIAGNOSIS — K029 Dental caries, unspecified: Secondary | ICD-10-CM

## 2023-05-25 DIAGNOSIS — Z5111 Encounter for antineoplastic chemotherapy: Secondary | ICD-10-CM

## 2023-05-25 DIAGNOSIS — Z79620 Long term (current) use of immunosuppressive biologic: Secondary | ICD-10-CM | POA: Diagnosis not present

## 2023-05-25 LAB — CBC WITH DIFFERENTIAL (CANCER CENTER ONLY)
Abs Immature Granulocytes: 0.03 10*3/uL (ref 0.00–0.07)
Basophils Absolute: 0.1 10*3/uL (ref 0.0–0.1)
Basophils Relative: 1 %
Eosinophils Absolute: 0.1 10*3/uL (ref 0.0–0.5)
Eosinophils Relative: 1 %
HCT: 37.2 % — ABNORMAL LOW (ref 39.0–52.0)
Hemoglobin: 12.4 g/dL — ABNORMAL LOW (ref 13.0–17.0)
Immature Granulocytes: 0 %
Lymphocytes Relative: 15 %
Lymphs Abs: 1.1 10*3/uL (ref 0.7–4.0)
MCH: 28.4 pg (ref 26.0–34.0)
MCHC: 33.3 g/dL (ref 30.0–36.0)
MCV: 85.3 fL (ref 80.0–100.0)
Monocytes Absolute: 0.9 10*3/uL (ref 0.1–1.0)
Monocytes Relative: 12 %
Neutro Abs: 5.5 10*3/uL (ref 1.7–7.7)
Neutrophils Relative %: 71 %
Platelet Count: 382 10*3/uL (ref 150–400)
RBC: 4.36 MIL/uL (ref 4.22–5.81)
RDW: 17.2 % — ABNORMAL HIGH (ref 11.5–15.5)
WBC Count: 7.7 10*3/uL (ref 4.0–10.5)
nRBC: 0 % (ref 0.0–0.2)

## 2023-05-25 LAB — CMP (CANCER CENTER ONLY)
ALT: 53 U/L — ABNORMAL HIGH (ref 0–44)
AST: 40 U/L (ref 15–41)
Albumin: 3.9 g/dL (ref 3.5–5.0)
Alkaline Phosphatase: 105 U/L (ref 38–126)
Anion gap: 8 (ref 5–15)
BUN: 15 mg/dL (ref 6–20)
CO2: 26 mmol/L (ref 22–32)
Calcium: 9.3 mg/dL (ref 8.9–10.3)
Chloride: 105 mmol/L (ref 98–111)
Creatinine: 1 mg/dL (ref 0.61–1.24)
GFR, Estimated: >60 mL/min (ref >60)
Glucose, Bld: 136 mg/dL — ABNORMAL HIGH (ref 70–99)
Potassium: 3.5 mmol/L (ref 3.5–5.1)
Sodium: 139 mmol/L (ref 135–145)
Total Bilirubin: 0.4 mg/dL (ref 0.3–1.2)
Total Protein: 7.1 g/dL (ref 6.5–8.1)

## 2023-05-25 MED ORDER — SODIUM CHLORIDE 0.9 % IV SOLN
150.0000 mg | Freq: Once | INTRAVENOUS | Status: AC
  Administered 2023-05-25: 150 mg via INTRAVENOUS
  Filled 2023-05-25: qty 150
  Filled 2023-05-25: qty 5

## 2023-05-25 MED ORDER — DIPHENHYDRAMINE HCL 50 MG/ML IJ SOLN
50.0000 mg | Freq: Once | INTRAMUSCULAR | Status: AC
  Administered 2023-05-25: 50 mg via INTRAVENOUS
  Filled 2023-05-25: qty 1

## 2023-05-25 MED ORDER — ACETAMINOPHEN 325 MG PO TABS
650.0000 mg | ORAL_TABLET | Freq: Once | ORAL | Status: AC
  Administered 2023-05-25: 650 mg via ORAL
  Filled 2023-05-25: qty 2

## 2023-05-25 MED ORDER — SODIUM CHLORIDE 0.9 % IV SOLN
Freq: Once | INTRAVENOUS | Status: AC
  Filled 2023-05-25: qty 250

## 2023-05-25 MED ORDER — SODIUM CHLORIDE 0.9 % IV SOLN
750.0000 mg/m2 | Freq: Once | INTRAVENOUS | Status: AC
  Administered 2023-05-25: 2000 mg via INTRAVENOUS
  Filled 2023-05-25: qty 100

## 2023-05-25 MED ORDER — HEPARIN SOD (PORK) LOCK FLUSH 100 UNIT/ML IV SOLN
500.0000 [IU] | Freq: Once | INTRAVENOUS | Status: AC | PRN
  Administered 2023-05-25: 500 [IU]
  Filled 2023-05-25: qty 5

## 2023-05-25 MED ORDER — SODIUM CHLORIDE 0.9 % IV SOLN
180.0000 mg | Freq: Once | INTRAVENOUS | Status: AC
  Administered 2023-05-25: 180 mg via INTRAVENOUS
  Filled 2023-05-25: qty 36

## 2023-05-25 MED ORDER — DOXORUBICIN HCL CHEMO IV INJECTION 2 MG/ML
50.0000 mg/m2 | Freq: Once | INTRAVENOUS | Status: AC
  Administered 2023-05-25: 120 mg via INTRAVENOUS
  Filled 2023-05-25: qty 60

## 2023-05-25 MED ORDER — SODIUM CHLORIDE 0.9% FLUSH
10.0000 mL | INTRAVENOUS | Status: DC | PRN
  Administered 2023-05-25: 10 mL
  Filled 2023-05-25: qty 10

## 2023-05-25 MED ORDER — AMOXICILLIN-POT CLAVULANATE 875-125 MG PO TABS: 1.0000 | ORAL_TABLET | Freq: Two times a day (BID) | ORAL | 0 refills | Status: DC

## 2023-05-25 MED ORDER — SODIUM CHLORIDE 0.9 % IV SOLN
10.0000 mg | Freq: Once | INTRAVENOUS | Status: AC
  Administered 2023-05-25: 10 mg via INTRAVENOUS
  Filled 2023-05-25: qty 10
  Filled 2023-05-25: qty 1

## 2023-05-25 MED ORDER — PALONOSETRON HCL INJECTION 0.25 MG/5ML
0.2500 mg | Freq: Once | INTRAVENOUS | Status: AC
  Administered 2023-05-25: 0.25 mg via INTRAVENOUS
  Filled 2023-05-25: qty 5

## 2023-05-25 NOTE — Assessment & Plan Note (Signed)
Continue antiemetics PRN 

## 2023-05-25 NOTE — Assessment & Plan Note (Signed)
Chemotherapy plan as listed above 

## 2023-05-25 NOTE — Assessment & Plan Note (Signed)
At risk of developing recurrent tooth infection. Recommend another 2 weeks of Augmentin treatments.  Refills sent. Patient has appointment with oral surgeon next week.

## 2023-05-25 NOTE — Patient Instructions (Signed)
[facility]  Discharge Instructions: Thank you for choosing [facility] to provide your oncology and hematology care.  If you have a lab appointment with the Cancer Center, please go directly to the Cancer Center and check in at the registration area.  Wear comfortable clothing and clothing appropriate for easy access to any Portacath or PICC line.   We strive to give you quality time with your provider. You may need to reschedule your appointment if you arrive late (15 or more minutes).  Arriving late affects you and other patients whose appointments are after yours.  Also, if you miss three or more appointments without notifying the office, you may be dismissed from the clinic at the provider's discretion.      For prescription refill requests, have your pharmacy contact our office and allow 72 hours for refills to be completed.    Today you received the following chemotherapy and/or immunotherapy agents: DOXOrubicin, brentuximab & cyclophosphamide     To help prevent nausea and vomiting after your treatment, we encourage you to take your nausea medication as directed.  BELOW ARE SYMPTOMS THAT SHOULD BE REPORTED IMMEDIATELY: *FEVER GREATER THAN 100.4 F (38 C) OR HIGHER *CHILLS OR SWEATING *NAUSEA AND VOMITING THAT IS NOT CONTROLLED WITH YOUR NAUSEA MEDICATION *UNUSUAL SHORTNESS OF BREATH *UNUSUAL BRUISING OR BLEEDING *URINARY PROBLEMS (pain or burning when urinating, or frequent urination) *BOWEL PROBLEMS (unusual diarrhea, constipation, pain near the anus) TENDERNESS IN MOUTH AND THROAT WITH OR WITHOUT PRESENCE OF ULCERS (sore throat, sores in mouth, or a toothache) UNUSUAL RASH, SWELLING OR PAIN  UNUSUAL VAGINAL DISCHARGE OR ITCHING   Items with * indicate a potential emergency and should be followed up as soon as possible or go to the Emergency Department if any problems should occur.  Please show the CHEMOTHERAPY ALERT CARD or  IMMUNOTHERAPY ALERT CARD at check-in to the Emergency Department and triage nurse.  Should you have questions after your visit or need to cancel or reschedule your appointment, please contact [facility] [facility]  [phone] and follow the prompts.  Office hours are 8:00 a.m. to 4:30 p.m. Monday - Friday. Please note that voicemails left after 4:00 p.m. may not be returned until the following business day.  We are closed weekends and major holidays. You have access to a nurse at all times for urgent questions. Please call the main number to the clinic [phone] and follow the prompts.  For any non-urgent questions, you may also contact your provider using MyChart. We now offer e-Visits for anyone 40 and older to request care online for non-urgent symptoms. For details visit [email or web address].   Also download the MyChart app! Go to the app store, search "MyChart", open the app, select [facility], and log in with your MyChart username and password.

## 2023-05-25 NOTE — Progress Notes (Signed)
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  Recommend BV + CHP , restage with PET after 3 cycles   Labs are reviewed and discussed with patient. Proceed with cycle 3 BV-CHP with D2 GCSF, he will take Prednisone D2-5.  Obtain PET scan History of genitle herpes, recommend acyclovir prophylaxis. 400mg  BID   Encounter for antineoplastic chemotherapy Chemotherapy plan as listed above  Chemotherapy-induced nausea Continue antiemetics PRN  Dental caries At risk of developing recurrent tooth infection. Recommend another 2 weeks of Augmentin treatments.  Refills sent. Patient has appointment with oral surgeon next week.    No orders of the defined types were placed in this encounter.  Follow up  3 weeks lab MD BV-CHP D2 GSCF All questions were answered. The patient knows to call the clinic with any problems, questions or concerns.  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 02/09/2023 CT abdomen pelvis with contrast showed massive lymphadenopathy of left inguinal groin, largest  measures 6.1 x 5.5 x 4.4 cm  Smalt fat containing left inguinal hernia.  5 mm left solid pulmonary nodule.  Urine was negative for chlamydia and gonorrhea  patient was given empiric antibiotics and referred to establish care with oncology for further evaluation.   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  Excisional biopsy was recommended.    Cytogenetic FISH studies: DUSP22 rearrangement positive, TP63 rearrangement negative.   03/12/2023, left inguinal excisional lymph node biopsy showed CD30 positive T-cell lymphoma, most consistent with ALK negative anaplastic large cell lymphoma.    03/21/2023, bone marrow biopsy showed normocellular bone marrow for age with trilineage hematopoiesis.  No significant CD34 positive blastic population identified.  No monoclonal B-cell population or significant T-cell abnormalities identified     03/14/2023 Imaging   PET scan showed  1. Hypermetabolic left inguinal and left external iliac lymph nodes consistent with lymphomatous disease involvement. 2. Diffuse low-level FDG avidity throughout the axial and appendicular skeleton is nonspecific likely physiologic less likely reflecting lymphomatous involvement. 3. No splenomegaly. 4. Symmetric hypermetabolic hyperplasia of the tonsils is commonly reactive. 5. Tiny fat containing left inguinal hernia. 6. Aortic Atherosclerosis    03/22/2023 Cancer Staging   Staging form: Hodgkin and Non-Hodgkin Lymphoma, AJCC 8th Edition - Clinical stage from 03/22/2023: Stage II (Peripheral T-cell lymphoma) - Signed by Rickard Patience, MD on 04/05/2023 Stage prefix: Initial diagnosis   04/03/2023 Echocardiogram   LVEF 60 to 65%    04/12/2023 -  Chemotherapy   Patient is on  Treatment Plan :  LYMPHOMA  A + CHP (Brentuximab + Cyclophosphamide + Doxorubicin + Prednisone) q21 Days x 6 Cycles     04/12/2023 Procedure   Medi port placed by Dr. Maia Plan   05/05/2023 - 05/10/2023 Hospital Admission   Patient was hospitalized due to sepsis, dental caries with dental infection He was treated with vancomycin, Rocephin and Flagyl transition to cefdinir Bactrim discharged home 05/09/2023 He was readmitted the same day due to vasovagal syncope.  He was also found to have elevated LFTs was found to be likely secondary to Bactrim.  Switched to Augmentin at discharge.    Reviewed previous medical history. 01/08/2014-01/08/2021, patient presented emergency room for headache,.  LP was done which showed lymphocytic predominant pleocytosis, HSV 2+, RPR reactive 1:4, HIV and crypto Ag negative.  He was treated with HSV meningitis with acyclovir.  Patient also was treated for skin eruptions secondary syphilis with penicillin injections.   INTERVAL HISTORY Randy [name]. is a 42 y.o. male who has above history reviewed by me today presents for follow up visit for Anaplastic ALK-negative large cell lymphoma   During interval, patient was hospitalized due to dental carries with infection, vasovagal syncope.  He has finished course of antibiotics. Today he feels the dental pain has improved.  He has an upcoming appointment with oral surgeon next week.  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 ([name])    Migraines    Seizures ([name])     SURGICAL HISTORY: Past Surgical History:  Procedure Laterality Date   INGUINAL LYMPH NODE BIOPSY Left 03/12/2023   Procedure: INGUINAL LYMPH NODE BIOPSY excisional;  Surgeon: Carolan Shiver, MD;  Location: [facility] ORS;  Service: General;  Laterality: Left;   IR BONE MARROW BIOPSY & ASPIRATION  03/21/2023   PORTACATH PLACEMENT N/A 04/11/2023   Procedure: INSERTION  PORT-A-CATH;  Surgeon: Carolan Shiver, MD;  Location: [facility] ORS;  Service: General;  Laterality: N/A;    SOCIAL HISTORY: Social History   Socioeconomic History   Marital status: Married    Spouse name: Not on file   Number of children: Not on file   Years of education: Not on file   Highest education level: Not on file  Occupational History   Not on file  Tobacco Use   Smoking status: Former    Years: 15    Types: Cigarettes    Quit date: 03/25/2022    Years since quitting: 1.1   Smokeless tobacco: Not on file  Vaping Use   Vaping Use: Some days   Start date: 03/27/2023  Substance and Sexual Activity   Alcohol use: Yes    Comment: occasionaly 1-2 a year   Drug use: Yes    Frequency: 5.0 times per week    Types: Marijuana    Comment: smoke 4-5 times a week.   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 He is a Curator.    Social Determinants of Health   Financial Resource Strain: Low Risk  (02/13/2023)   Overall Financial Resource Strain (CARDIA)    Difficulty of Paying Living Expenses: Not hard at all  Food Insecurity: No Food Insecurity (05/10/2023)   Hunger Vital Sign    Worried About Running Out of Food in the Last Year: Never true    Ran Out of Food  in the Last Year: Never true  Transportation Needs: No Transportation Needs (05/10/2023)   PRAPARE - Administrator, Civil Service (Medical): No    Lack of Transportation (Non-Medical): No  Physical Activity: Not on file  Stress: No Stress Concern Present (02/13/2023)   [employer] of Occupational Health - Occupational Stress Questionnaire    Feeling of Stress : Not at all  Social Connections: Not on file  Intimate Partner Violence: Not At Risk (05/10/2023)   Humiliation, Afraid, Rape, and Kick questionnaire    Fear of Current or Ex-Partner: No    Emotionally Abused: No    Physically Abused: No     Sexually Abused: No    FAMILY HISTORY: Family History  Problem Relation Age of Onset   Diabetes Mother    Hypertension Mother    Hypertension Father    Stroke Father    Diabetes Father    Cancer Paternal Grandmother        breat cancer    ALLERGIES:  has No Known Allergies.  MEDICATIONS:  Current Outpatient Medications  Medication Sig Dispense Refill   acyclovir (ZOVIRAX) 400 MG tablet Take 1 tablet (400 mg total) by mouth 2 (two) times daily. 60 tablet 1   chlorhexidine (PERIDEX) 0.12 % solution Use as directed 15 mLs in the mouth or throat 2 (two) times daily. 473 mL 3   ibuprofen (ADVIL) 200 MG tablet Take 400 mg by mouth every 6 (six) hours as needed for moderate pain.     lidocaine-prilocaine (EMLA) cream Apply to affected area once (Patient taking differently: Apply 1 Application topically once. Apply to affected area once) 30 g 3   ondansetron (ZOFRAN) 8 MG tablet Take 1 tablet (8 mg total) by mouth every 8 (eight) hours as needed for nausea or vomiting. Start on the third day after cyclophosphamide chemotherapy. 30 tablet 1   predniSONE (DELTASONE) 20 MG tablet Take 5 tablets (100 mg total) by mouth daily. Take on days 2-5 of chemotherapy. 20 tablet 7   prochlorperazine (COMPAZINE) 10 MG tablet Take 1 tablet (10 mg total) by mouth every 6 (six) hours as needed for nausea or vomiting. 30 tablet 6   traMADol (ULTRAM) 50 MG tablet Take 1 tablet (50 mg total) by mouth every 6 (six) hours as needed. (Patient taking differently: Take 50 mg by mouth every 6 (six) hours as needed for moderate pain.) 10 tablet 0   amoxicillin-clavulanate (AUGMENTIN) 875-125 MG tablet Take 1 tablet by mouth every 12 (twelve) hours. 14 tablet 0   No current facility-administered medications for this visit.   Facility-Administered Medications Ordered in Other Visits  Medication Dose Route Frequency Provider Last Rate Last Admin   sodium chloride flush (NS) 0.9 % injection 10 mL  10 mL Intracatheter  PRN Rickard Patience, MD   10 mL at 05/25/23 0941    Review of Systems  Constitutional:  Negative for appetite change, chills, fatigue, fever and unexpected weight change.  HENT:   Negative for hearing loss and voice change.   Eyes:  Negative for eye problems and icterus.  Respiratory:  Negative for chest tightness, cough and shortness of breath.   Cardiovascular:  Negative for chest pain and leg swelling.  Gastrointestinal:  Negative for abdominal distention and abdominal pain.  Endocrine: Negative for hot flashes.  Genitourinary:  Negative for difficulty urinating, dysuria and frequency.        Left inguinal mass  Musculoskeletal:  Negative for arthralgias.  Skin:  Negative for itching  and rash.  Neurological:  Negative for light-headedness and numbness.  Hematological:  Negative for adenopathy. Does not bruise/bleed easily.  Psychiatric/Behavioral:  Negative for confusion.      PHYSICAL EXAMINATION: ECOG PERFORMANCE STATUS: 0 - Asymptomatic  Vitals:   05/25/23 0910  BP: (!) 144/93  Pulse: 93  Resp: 18  Temp: 98.2 F (36.8 C)  SpO2: 98%   Filed Weights   05/25/23 0910  Weight: 260 lb 9.6 oz (118.2 kg)    Physical Exam Constitutional:      General: He is not in acute distress. HENT:     Head: Normocephalic and atraumatic.  Eyes:     General: No scleral icterus. Cardiovascular:     Rate and Rhythm: Normal rate and regular rhythm.     Heart sounds: No murmur heard. Pulmonary:     Effort: Pulmonary effort is normal. No respiratory distress.  Abdominal:     General: There is no distension.     Palpations: Abdomen is soft.  Genitourinary:    Comments: Left inguinal massive lymphadenopathy, fixed  Musculoskeletal:        General: Normal range of motion.     Cervical back: Normal range of motion and neck supple.  Skin:    General: Skin is warm and dry.     Findings: No erythema.  Neurological:     Mental Status: He is alert and oriented to person, place, and time.  Mental status is at baseline.     Cranial Nerves: No cranial nerve deficit.     Motor: No abnormal muscle tone.  Psychiatric:        Mood and Affect: Mood and affect normal.     RN Merleen Nicely served as chaperone during GU examination LABORATORY DATA:  I have reviewed the data as listed    Latest Ref Rng & Units 05/25/2023    8:59 AM 05/10/2023    5:49 AM 05/09/2023   12:23 PM  CBC  WBC 4.0 - 10.5 K/uL 7.7  7.5  10.4   Hemoglobin 13.0 - 17.0 g/dL 16.1  09.6  04.5   Hematocrit 39.0 - 52.0 % 37.2  41.9  45.2   Platelets 150 - 400 K/uL 382  284  349       Latest Ref Rng & Units 05/25/2023    8:59 AM 05/10/2023    5:49 AM 05/09/2023   12:23 PM  CMP  Glucose 70 - 99 mg/dL 409  811  914   BUN 6 - 20 mg/dL 15  15  19    Creatinine 0.61 - 1.24 mg/dL 7.82  9.56  2.13   Sodium 135 - 145 mmol/L 139  136  135   Potassium 3.5 - 5.1 mmol/L 3.5  3.6  3.2   Chloride 98 - 111 mmol/L 105  102  100   CO2 22 - 32 mmol/L 26  27  24    Calcium 8.9 - 10.3 mg/dL 9.3  9.1  9.0   Total Protein 6.5 - 8.1 g/dL 7.1  6.5  7.0   Total Bilirubin 0.3 - 1.2 mg/dL 0.4  0.9  1.0   Alkaline Phos 38 - 126 U/L 105  130  139   AST 15 - 41 U/L 40  61  50   ALT 0 - 44 U/L 53  95  69      RADIOGRAPHIC STUDIES: I have personally reviewed the radiological images as listed and agreed with the findings in the report. X-ray chest PA and lateral  Result Date: 05/09/2023 CLINICAL DATA:  Syncope EXAM: CHEST - 2 VIEW COMPARISON:  05/05/2023 FINDINGS: Right-sided central venous port tip over the cavoatrial region. No acute airspace disease or effusion. Stable cardiomediastinal silhouette. No pneumothorax. IMPRESSION: No active cardiopulmonary disease. Electronically Signed   By: Jasmine Pang M.D.   On: 05/09/2023 15:21   CT ABDOMEN PELVIS W CONTRAST  Result Date: 05/06/2023 CLINICAL DATA:  History of lymphoma with recent chemotherapy and increasing nausea and vomiting, initial encounter EXAM: CT ABDOMEN AND PELVIS WITH  CONTRAST TECHNIQUE: Multidetector CT imaging of the abdomen and pelvis was performed using the standard protocol following bolus administration of intravenous contrast. RADIATION DOSE REDUCTION: This exam was performed according to the departmental dose-optimization program which includes automated exposure control, adjustment of the mA and/or kV according to patient size and/or use of iterative reconstruction technique. CONTRAST:  OMNIPAQUE IOHEXOL 300 MG/ML  SOLN COMPARISON:  03/14/2023 PET-CT FINDINGS: Lower chest: No acute abnormality. Hepatobiliary: No focal liver abnormality is seen. No gallstones, gallbladder wall thickening, or biliary dilatation. Pancreas: Unremarkable. No pancreatic ductal dilatation or surrounding inflammatory changes. Spleen: Normal in size without focal abnormality. Adrenals/Urinary Tract: Adrenal glands are within normal limits. Kidneys are well visualized within normal enhancement pattern. Small stable cyst is noted in the left kidney. No follow-up is recommended. No obstructive changes are seen. The bladder is partially distended. Stomach/Bowel: The appendix is well visualized and within normal limits. No obstructive or inflammatory changes of the colon are seen. Stomach and small bowel are within normal limits. Vascular/Lymphatic: Atherosclerotic changes are noted. Left inguinal lymph node is noted measuring 2.1 x 1.7 cm. This is decreased in the interval from the prior PET-CT at which time it measured up to 2.8 cm. Reproductive: Prostate is unremarkable. Other: Stable fat containing left inguinal hernia is noted. No abdominopelvic ascites. Musculoskeletal: No acute or significant osseous findings. IMPRESSION: Left inguinal lymph node slightly decreased in size when compared with the prior PET-CT. No acute abnormality is identified to correspond with the given clinical history. Electronically Signed   By: Alcide Clever M.D.   On: 05/06/2023 01:17   CT Maxillofacial W  Contrast  Result Date: 05/06/2023 CLINICAL DATA:  Right mandibular molar fracture EXAM: CT MAXILLOFACIAL WITH CONTRAST TECHNIQUE: Multidetector CT imaging of the maxillofacial structures was performed with intravenous contrast. Multiplanar CT image reconstructions were also generated. RADIATION DOSE REDUCTION: This exam was performed according to the departmental dose-optimization program which includes automated exposure control, adjustment of the mA and/or kV according to patient size and/or use of iterative reconstruction technique. CONTRAST:  OMNIPAQUE IOHEXOL 300 MG/ML  SOLN COMPARISON:  None Available. FINDINGS: Osseous: Poor dentition with multiple periapical lucencies. There are caries of the most posterior 2 remaining mandibular molars. Periapical lucency at the root of tooth 8 extends to the incisive foramen. No dental abscess or facial inflammation. Orbits: Negative. No traumatic or inflammatory finding. Sinuses: Clear. Soft tissues: Negative. Limited intracranial: No significant or unexpected finding. IMPRESSION: Poor dentition with multiple periapical lucencies and dental caries, including of the most posterior 2 remaining mandibular molars. No dental abscess or facial inflammation. Electronically Signed   By: Deatra Robinson M.D.   On: 05/06/2023 01:02   DG Chest 2 View  Result Date: 05/06/2023 CLINICAL DATA:  Nausea and vomiting. EXAM: CHEST - 2 VIEW COMPARISON:  April 11, 2023 FINDINGS: There is stable right-sided venous Port-A-Cath positioning. The heart size and mediastinal contours are within normal limits. Both lungs are clear. The visualized skeletal structures  are unremarkable. IMPRESSION: No active cardiopulmonary disease. Electronically Signed   By: Aram Candela M.D.   On: 05/06/2023 00:14

## 2023-05-25 NOTE — Assessment & Plan Note (Addendum)
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  Recommend BV + CHP , restage with PET after 3 cycles   Labs are reviewed and discussed with patient. Proceed with cycle 3 BV-CHP with D2 GCSF, he will take Prednisone D2-5.  Obtain PET scan History of genitle herpes, recommend acyclovir prophylaxis. 400mg  BID

## 2023-05-28 ENCOUNTER — Telehealth: Payer: Self-pay | Admitting: *Deleted

## 2023-05-28 ENCOUNTER — Inpatient Hospital Stay: Payer: 59 | Attending: Oncology

## 2023-05-28 DIAGNOSIS — Z5112 Encounter for antineoplastic immunotherapy: Secondary | ICD-10-CM | POA: Insufficient documentation

## 2023-05-28 DIAGNOSIS — E876 Hypokalemia: Secondary | ICD-10-CM | POA: Insufficient documentation

## 2023-05-28 DIAGNOSIS — C8475 Anaplastic large cell lymphoma, ALK-negative, lymph nodes of inguinal region and lower limb: Secondary | ICD-10-CM | POA: Diagnosis present

## 2023-05-28 DIAGNOSIS — Z5111 Encounter for antineoplastic chemotherapy: Secondary | ICD-10-CM | POA: Insufficient documentation

## 2023-05-28 DIAGNOSIS — Z5189 Encounter for other specified aftercare: Secondary | ICD-10-CM | POA: Insufficient documentation

## 2023-05-28 MED ORDER — PEGFILGRASTIM-JMDB 6 MG/0.6ML ~~LOC~~ SOSY
6.0000 mg | PREFILLED_SYRINGE | Freq: Once | SUBCUTANEOUS | Status: AC
  Administered 2023-05-28: 6 mg via SUBCUTANEOUS
  Filled 2023-05-28: qty 0.6

## 2023-05-28 NOTE — Telephone Encounter (Signed)
Patient came to clinic today for his GCSF injection. C/o nausea w/o vomiting. Last took zofran 2-3 hours ago. He is not getting immediate relief from zofran. I advised pt to take his oral compazine when he gets home to see if this would help relieve his symptoms. I also offered him an apt in smc/fluid clinic, but pt declined. He is hoping that his symptoms will improve after taking the compazine. Pt will let our office know if his symptoms do not improve.

## 2023-05-29 ENCOUNTER — Telehealth: Payer: Self-pay

## 2023-05-29 NOTE — Telephone Encounter (Signed)
Referral made to [facility]. Fax #[phone].

## 2023-06-04 ENCOUNTER — Ambulatory Visit
Admission: RE | Admit: 2023-06-04 | Discharge: 2023-06-04 | Disposition: A | Discharge status: Home / Self Care | Payer: 59 | Source: Ambulatory Visit | Attending: Oncology | Admitting: Oncology

## 2023-06-04 DIAGNOSIS — C8335 Diffuse large B-cell lymphoma, lymph nodes of inguinal region and lower limb: Secondary | ICD-10-CM | POA: Diagnosis not present

## 2023-06-04 DIAGNOSIS — C8475 Anaplastic large cell lymphoma, ALK-negative, lymph nodes of inguinal region and lower limb: Secondary | ICD-10-CM

## 2023-06-04 LAB — GLUCOSE, CAPILLARY: Glucose-Capillary: 98 mg/dL (ref 70–99)

## 2023-06-04 MED ORDER — FLUDEOXYGLUCOSE F - 18 (FDG) INJECTION
12.0000 | Freq: Once | INTRAVENOUS | Status: AC | PRN
  Administered 2023-06-04: 12.68 via INTRAVENOUS

## 2023-06-07 ENCOUNTER — Ambulatory Visit: Payer: 59 | Admitting: Nurse Practitioner

## 2023-06-14 ENCOUNTER — Ambulatory Visit: Payer: 59 | Admitting: Oncology

## 2023-06-14 ENCOUNTER — Other Ambulatory Visit: Payer: 59

## 2023-06-14 ENCOUNTER — Ambulatory Visit: Payer: 59

## 2023-06-14 MED FILL — Fosaprepitant Dimeglumine For IV Infusion 150 MG (Base Eq): INTRAVENOUS | Qty: 5 | Status: AC

## 2023-06-14 MED FILL — Dexamethasone Sodium Phosphate Inj 100 MG/10ML: INTRAMUSCULAR | Qty: 1 | Status: AC

## 2023-06-15 ENCOUNTER — Ambulatory Visit: Payer: 59

## 2023-06-15 ENCOUNTER — Inpatient Hospital Stay: Payer: 59

## 2023-06-15 ENCOUNTER — Inpatient Hospital Stay: Payer: 59 | Admitting: Oncology

## 2023-06-15 ENCOUNTER — Encounter: Payer: Self-pay | Admitting: Oncology

## 2023-06-15 VITALS — BP 146/107 | HR 94 | Temp 97.3°F | Ht 73.0 in | Wt 266.0 lb

## 2023-06-15 DIAGNOSIS — Z5111 Encounter for antineoplastic chemotherapy: Secondary | ICD-10-CM

## 2023-06-15 DIAGNOSIS — R110 Nausea: Secondary | ICD-10-CM

## 2023-06-15 DIAGNOSIS — C8475 Anaplastic large cell lymphoma, ALK-negative, lymph nodes of inguinal region and lower limb: Secondary | ICD-10-CM

## 2023-06-15 DIAGNOSIS — Z5189 Encounter for other specified aftercare: Secondary | ICD-10-CM | POA: Diagnosis not present

## 2023-06-15 DIAGNOSIS — E876 Hypokalemia: Secondary | ICD-10-CM | POA: Diagnosis not present

## 2023-06-15 DIAGNOSIS — Z5112 Encounter for antineoplastic immunotherapy: Secondary | ICD-10-CM | POA: Diagnosis not present

## 2023-06-15 DIAGNOSIS — K029 Dental caries, unspecified: Secondary | ICD-10-CM | POA: Diagnosis not present

## 2023-06-15 DIAGNOSIS — T451X5A Adverse effect of antineoplastic and immunosuppressive drugs, initial encounter: Secondary | ICD-10-CM

## 2023-06-15 LAB — CMP (CANCER CENTER ONLY)
ALT: 54 U/L — ABNORMAL HIGH (ref 0–44)
AST: 39 U/L (ref 15–41)
Albumin: 3.9 g/dL (ref 3.5–5.0)
Alkaline Phosphatase: 92 U/L (ref 38–126)
Anion gap: 8 (ref 5–15)
BUN: 17 mg/dL (ref 6–20)
CO2: 24 mmol/L (ref 22–32)
Calcium: 8.9 mg/dL (ref 8.9–10.3)
Chloride: 108 mmol/L (ref 98–111)
Creatinine: 0.97 mg/dL (ref 0.61–1.24)
GFR, Estimated: >60 mL/min (ref >60)
Glucose, Bld: 104 mg/dL — ABNORMAL HIGH (ref 70–99)
Potassium: 3.7 mmol/L (ref 3.5–5.1)
Sodium: 140 mmol/L (ref 135–145)
Total Bilirubin: 0.5 mg/dL (ref 0.3–1.2)
Total Protein: 6.8 g/dL (ref 6.5–8.1)

## 2023-06-15 LAB — CBC WITH DIFFERENTIAL (CANCER CENTER ONLY)
Abs Immature Granulocytes: 0.04 10*3/uL (ref 0.00–0.07)
Basophils Absolute: 0.1 10*3/uL (ref 0.0–0.1)
Basophils Relative: 1 %
Eosinophils Absolute: 0 10*3/uL (ref 0.0–0.5)
Eosinophils Relative: 0 %
HCT: 36.3 % — ABNORMAL LOW (ref 39.0–52.0)
Hemoglobin: 12.1 g/dL — ABNORMAL LOW (ref 13.0–17.0)
Immature Granulocytes: 1 %
Lymphocytes Relative: 13 %
Lymphs Abs: 1 10*3/uL (ref 0.7–4.0)
MCH: 28.9 pg (ref 26.0–34.0)
MCHC: 33.3 g/dL (ref 30.0–36.0)
MCV: 86.8 fL (ref 80.0–100.0)
Monocytes Absolute: 1 10*3/uL (ref 0.1–1.0)
Monocytes Relative: 13 %
Neutro Abs: 5.6 10*3/uL (ref 1.7–7.7)
Neutrophils Relative %: 72 %
Platelet Count: 398 10*3/uL (ref 150–400)
RBC: 4.18 MIL/uL — ABNORMAL LOW (ref 4.22–5.81)
RDW: 18.7 % — ABNORMAL HIGH (ref 11.5–15.5)
WBC Count: 7.7 10*3/uL (ref 4.0–10.5)
nRBC: 0 % (ref 0.0–0.2)

## 2023-06-15 NOTE — Assessment & Plan Note (Signed)
At risk of developing recurrent tooth infection. Recommend patient follow up with oral surgeon.

## 2023-06-15 NOTE — Progress Notes (Signed)
Ct results.  C/o b/l hand numbness.

## 2023-06-15 NOTE — Assessment & Plan Note (Signed)
Chemotherapy plan as listed above 

## 2023-06-15 NOTE — Assessment & Plan Note (Signed)
Continue antiemetics PRN 

## 2023-06-15 NOTE — Progress Notes (Signed)
Hematology/Oncology Progress note [phone] [phone]      CHIEF COMPLAINTS/PURPOSE OF CONSULTATION:  ALK negative, anaplastic large cell lymphoma.   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  On BV + CHP , PET3 showed CR, [place] 2  Recommend patient to finish 3 more cycles of BV+ CHP Labs are reviewed and discussed with patient. Proceed with cycle 4  BV-CHP with D3 GCSF, he will take Prednisone D2-5.  He prefers to delay his treatment to 6/24 History of genitle herpes, recommend acyclovir prophylaxis. 400mg  BID   Encounter for antineoplastic chemotherapy Chemotherapy plan as listed above  Chemotherapy-induced nausea Continue antiemetics PRN  Dental caries At risk of developing recurrent tooth infection. Recommend patient follow up with oral surgeon.     Orders Placed This Encounter  Procedures   CBC with Differential (Cancer Center Only)    Standing Status:   Future    Standing Expiration Date:   07/08/2024   CMP (Cancer Center only)    Standing Status:   Future    Standing Expiration Date:   07/08/2024   CBC with Differential (Cancer Center Only)    Standing Status:   Future    Standing Expiration Date:   07/29/2024   CMP (Cancer Center only)    Standing Status:   Future    Standing Expiration Date:   07/29/2024    Follow up  3 weeks lab MD BV-CHP D3 GSCF All questions were answered. The patient knows to call the clinic with any problems, questions or concerns.  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 02/09/2023 CT abdomen pelvis with contrast showed massive lymphadenopathy of left inguinal groin, largest measures 6.1 x 5.5 x 4.4 cm  Smalt fat containing left inguinal hernia.  5 mm left solid pulmonary nodule.  Urine was negative for chlamydia and gonorrhea  patient was given empiric antibiotics and referred to establish care with oncology for further evaluation.   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  Excisional biopsy was recommended.    Cytogenetic FISH studies: DUSP22 rearrangement positive, TP63 rearrangement negative.   03/12/2023, left inguinal excisional lymph node biopsy showed CD30 positive T-cell lymphoma, most consistent with ALK negative anaplastic large cell lymphoma.    03/21/2023, bone marrow biopsy showed normocellular bone marrow for age with trilineage hematopoiesis.  No significant CD34 positive blastic population identified.  No monoclonal B-cell population or significant T-cell abnormalities identified     03/14/2023 Imaging   PET scan showed  1. Hypermetabolic left inguinal and left external iliac lymph nodes consistent with lymphomatous disease involvement. 2. Diffuse low-level FDG avidity throughout the  axial and appendicular skeleton is nonspecific likely physiologic less likely reflecting lymphomatous involvement. 3. No splenomegaly. 4. Symmetric hypermetabolic hyperplasia of the tonsils is commonly reactive. 5. Tiny fat  containing left inguinal hernia. 6. Aortic Atherosclerosis    03/22/2023 Cancer Staging   Staging form: Hodgkin and Non-Hodgkin Lymphoma, AJCC 8th Edition - Clinical stage from 03/22/2023: Stage II (Peripheral T-cell lymphoma) - Signed by Rickard Patience, MD on 04/05/2023 Stage prefix: Initial diagnosis   04/03/2023 Echocardiogram   LVEF 60 to 65%    04/12/2023 -  Chemotherapy   Patient is on Treatment Plan : LYMPHOMA  A + CHP (Brentuximab + Cyclophosphamide + Doxorubicin + Prednisone) q21 Days x 6 Cycles     04/12/2023 Procedure   Medi port placed by Dr. Maia Plan   05/05/2023 - 05/10/2023 Hospital Admission   Patient was hospitalized due to sepsis, dental caries with dental infection He was treated with vancomycin, Rocephin and Flagyl transition to cefdinir Bactrim discharged home 05/09/2023 He was readmitted the same day due to vasovagal syncope.  He was also found to have elevated LFTs was found to be likely secondary to Bactrim.  Switched to Augmentin at discharge.    Imaging   PET showed  1. Marked reduction in size and metabolic activity of LEFT inguinal lymph nodes. Deauville 2 2. No evidence of new or progressive lymphoma. 3. Normal spleen. 4. Uniform increased bone marrow activity most likely a GCSF type marrow response    Reviewed previous medical history. 01/08/2014-01/08/2021, patient presented emergency room for headache,.  LP was done which showed lymphocytic predominant pleocytosis, HSV 2+, RPR reactive 1:4, HIV and crypto Ag negative.  He was treated with HSV meningitis with acyclovir.  Patient also was treated for skin eruptions secondary syphilis with penicillin injections.   INTERVAL HISTORY Randy [name]. is a 42 y.o. male who has above history reviewed by me today presents for follow up visit for Anaplastic ALK-negative large cell lymphoma   Today he feels the dental pain has improved, not bothering him now.  Appetite is fair. No nausea vomiting diarrhea.    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 ([name])    Migraines    Seizures ([name])     SURGICAL HISTORY: Past Surgical History:  Procedure Laterality Date   INGUINAL LYMPH NODE BIOPSY Left 03/12/2023   Procedure: INGUINAL LYMPH NODE BIOPSY excisional;  Surgeon: Carolan Shiver, MD;  Location: [facility] ORS;  Service: General;  Laterality: Left;   IR BONE MARROW BIOPSY & ASPIRATION  03/21/2023   PORTACATH PLACEMENT N/A 04/11/2023   Procedure: INSERTION PORT-A-CATH;  Surgeon: Carolan Shiver, MD;  Location: [facility] ORS;  Service: General;  Laterality: N/A;    SOCIAL HISTORY: Social History   Socioeconomic History   Marital status: Married    Spouse name: Not on file   Number of children: Not on file   Years of education: Not on file   Highest education level: Not on file  Occupational History   Not on file  Tobacco Use   Smoking status: Former    Years: 15    Types: Cigarettes    Quit date: 03/25/2022    Years since quitting: 1.2   Smokeless tobacco: Not on file  Vaping Use   Vaping Use: Some days   Start date: 03/27/2023  Substance and Sexual Activity   Alcohol use: Yes    Comment: occasionaly 1-2 a year  Drug use: Yes    Frequency: 5.0 times per week    Types: Marijuana    Comment: smoke 4-5 times a week.   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 He is a Curator.    Social Determinants of Health   Financial Resource Strain: Low Risk  (02/13/2023)   Overall Financial Resource Strain (CARDIA)    Difficulty of Paying Living Expenses: Not hard at all  Food Insecurity: No Food Insecurity (05/10/2023)   Hunger Vital Sign    Worried About Running Out of Food in the Last Year: Never true    Ran Out of Food in the Last Year: Never true  Transportation Needs: No Transportation Needs  (05/10/2023)   PRAPARE - Administrator, Civil Service (Medical): No    Lack of Transportation (Non-Medical): No  Physical Activity: Not on file  Stress: No Stress Concern Present (02/13/2023)   [employer] of Occupational Health - Occupational Stress Questionnaire    Feeling of Stress : Not at all  Social Connections: Not on file  Intimate Partner Violence: Not At Risk (05/10/2023)   Humiliation, Afraid, Rape, and Kick questionnaire    Fear of Current or Ex-Partner: No    Emotionally Abused: No    Physically Abused: No    Sexually Abused: No    FAMILY HISTORY: Family History  Problem Relation Age of Onset   Diabetes Mother    Hypertension Mother    Hypertension Father    Stroke Father    Diabetes Father    Cancer Paternal Grandmother        breat cancer    ALLERGIES:  has No Known Allergies.  MEDICATIONS:  Current Outpatient Medications  Medication Sig Dispense Refill   acyclovir (ZOVIRAX) 400 MG tablet Take 1 tablet (400 mg total) by mouth 2 (two) times daily. 60 tablet 1   amoxicillin-clavulanate (AUGMENTIN) 875-125 MG tablet Take 1 tablet by mouth every 12 (twelve) hours. 14 tablet 0   chlorhexidine (PERIDEX) 0.12 % solution Use as directed 15 mLs in the mouth or throat 2 (two) times daily. 473 mL 3   ibuprofen (ADVIL) 200 MG tablet Take 400 mg by mouth every 6 (six) hours as needed for moderate pain.     lidocaine-prilocaine (EMLA) cream Apply to affected area once (Patient taking differently: Apply 1 Application topically once. Apply to affected area once) 30 g 3   ondansetron (ZOFRAN) 8 MG tablet Take 1 tablet (8 mg total) by mouth every 8 (eight) hours as needed for nausea or vomiting. Start on the third day after cyclophosphamide chemotherapy. 30 tablet 1   predniSONE (DELTASONE) 20 MG tablet Take 5 tablets (100 mg total) by mouth daily. Take on days 2-5 of chemotherapy. 20 tablet 7   prochlorperazine (COMPAZINE) 10 MG tablet Take 1 tablet (10 mg total)  by mouth every 6 (six) hours as needed for nausea or vomiting. 30 tablet 6   traMADol (ULTRAM) 50 MG tablet Take 1 tablet (50 mg total) by mouth every 6 (six) hours as needed. (Patient taking differently: Take 50 mg by mouth every 6 (six) hours as needed for moderate pain.) 10 tablet 0   No current facility-administered medications for this visit.    Review of Systems  Constitutional:  Negative for appetite change, chills, fatigue, fever and unexpected weight change.  HENT:   Negative for hearing loss  and voice change.   Eyes:  Negative for eye problems and icterus.  Respiratory:  Negative for chest tightness, cough and shortness of breath.   Cardiovascular:  Negative for chest pain and leg swelling.  Gastrointestinal:  Negative for abdominal distention and abdominal pain.  Endocrine: Negative for hot flashes.  Genitourinary:  Negative for difficulty urinating, dysuria and frequency.        Left inguinal mass  Musculoskeletal:  Negative for arthralgias.  Skin:  Negative for itching and rash.  Neurological:  Negative for light-headedness and numbness.  Hematological:  Negative for adenopathy. Does not bruise/bleed easily.  Psychiatric/Behavioral:  Negative for confusion.      PHYSICAL EXAMINATION: ECOG PERFORMANCE STATUS: 0 - Asymptomatic  Vitals:   06/15/23 0855  BP: (!) 146/107  Pulse: 94  Temp: (!) 97.3 F (36.3 C)  SpO2: 100%   Filed Weights   06/15/23 0855  Weight: 266 lb (120.7 kg)    Physical Exam Constitutional:      General: He is not in acute distress. HENT:     Head: Normocephalic and atraumatic.  Eyes:     General: No scleral icterus. Cardiovascular:     Rate and Rhythm: Normal rate and regular rhythm.     Heart sounds: No murmur heard. Pulmonary:     Effort: Pulmonary effort is normal. No respiratory distress.  Abdominal:     General: There is no distension.     Palpations: Abdomen is soft.  Genitourinary:    Comments: Left inguinal massive  lymphadenopathy, fixed  Musculoskeletal:        General: Normal range of motion.     Cervical back: Normal range of motion and neck supple.  Skin:    General: Skin is warm and dry.     Findings: No erythema.  Neurological:     Mental Status: He is alert and oriented to person, place, and time. Mental status is at baseline.     Cranial Nerves: No cranial nerve deficit.     Motor: No abnormal muscle tone.  Psychiatric:        Mood and Affect: Mood and affect normal.     RN Merleen Nicely served as chaperone during GU examination LABORATORY DATA:  I have reviewed the data as listed    Latest Ref Rng & Units 06/15/2023    8:58 AM 05/25/2023    8:59 AM 05/10/2023    5:49 AM  CBC  WBC 4.0 - 10.5 K/uL 7.7  7.7  7.5   Hemoglobin 13.0 - 17.0 g/dL 08.6  57.8  46.9   Hematocrit 39.0 - 52.0 % 36.3  37.2  41.9   Platelets 150 - 400 K/uL 398  382  284       Latest Ref Rng & Units 06/15/2023    8:58 AM 05/25/2023    8:59 AM 05/10/2023    5:49 AM  CMP  Glucose 70 - 99 mg/dL 629  528  413   BUN 6 - 20 mg/dL 17  15  15    Creatinine 0.61 - 1.24 mg/dL 2.44  0.10  2.72   Sodium 135 - 145 mmol/L 140  139  136   Potassium 3.5 - 5.1 mmol/L 3.7  3.5  3.6   Chloride 98 - 111 mmol/L 108  105  102   CO2 22 - 32 mmol/L 24  26  27    Calcium 8.9 - 10.3 mg/dL 8.9  9.3  9.1   Total Protein 6.5 - 8.1 g/dL 6.8  7.1  6.5   Total Bilirubin 0.3 - 1.2 mg/dL 0.5  0.4  0.9   Alkaline Phos 38 - 126 U/L 92  105  130   AST 15 - 41 U/L 39  40  61   ALT 0 - 44 U/L 54  53  95      RADIOGRAPHIC STUDIES: I have personally reviewed the radiological images as listed and agreed with the findings in the report. NM PET Image Restag (PS) Skull Base To Thigh  Result Date: 06/08/2023 CLINICAL DATA:  Subsequent treatment strategy for anaplastic large cell lymphoma of the inguinal region. EXAM: NUCLEAR MEDICINE PET SKULL BASE TO THIGH TECHNIQUE: 12.7 mCi F-18 FDG was injected intravenously. Full-ring PET imaging was  performed from the skull base to thigh after the radiotracer. CT data was obtained and used for attenuation correction and anatomic localization. Fasting blood glucose: 8.5 mg/dl COMPARISON:  PET-CT 47/42/5956 FINDINGS: Mediastinal blood pool activity: SUV max 2.1 Liver activity: SUV max 0.7 NECK: No hypermetabolic lymph nodes in the neck. Incidental CT findings: None. CHEST: No hypermetabolic mediastinal or hilar nodes. No suspicious pulmonary nodules on the CT scan. Incidental CT findings: Port in the anterior chest wall with tip in distal SVC. ABDOMEN/PELVIS: Reduction size and metabolic activity of LEFT inguinal lymph nodes. The largest remaining lymph node measures 15 mm SUV max equal 2.4 which similar to background blood pool activity. Previously enlarged LEFT inguinal nodes were are intensely hypermetabolic SUV max equal 18. No iliac lymphadenopathy.  No periaortic retroperitoneal adenopathy. Normal volume of metabolic activity spleen. No evidence of malignancy in the abdomen pelvis. Incidental CT findings: Atherosclerotic calcification of the aorta. SKELETON: There is uniform increased bone marrow activity most likely a GCSF type marrow response. Incidental CT findings: None. IMPRESSION: 1. Marked reduction in size and metabolic activity of LEFT inguinal lymph nodes. Deauville 2 2. No evidence of new or progressive lymphoma. 3. Normal spleen. 4. Uniform increased bone marrow activity most likely a GCSF type marrow response. Electronically Signed   By: Genevive Bi M.D.   On: 06/08/2023 16:23

## 2023-06-15 NOTE — Assessment & Plan Note (Addendum)
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  On BV + CHP , PET3 showed CR, [place] 2  Recommend patient to finish 3 more cycles of BV+ CHP Labs are reviewed and discussed with patient. Proceed with cycle 4  BV-CHP with D3 GCSF, he will take Prednisone D2-5.  He prefers to delay his treatment to 6/24 History of genitle herpes, recommend acyclovir prophylaxis. 400mg  BID

## 2023-06-16 ENCOUNTER — Other Ambulatory Visit: Payer: Self-pay

## 2023-06-18 ENCOUNTER — Inpatient Hospital Stay: Payer: 59

## 2023-06-18 VITALS — BP 131/92 | HR 84 | Temp 98.2°F | Resp 18

## 2023-06-18 DIAGNOSIS — E876 Hypokalemia: Secondary | ICD-10-CM | POA: Diagnosis not present

## 2023-06-18 DIAGNOSIS — C8475 Anaplastic large cell lymphoma, ALK-negative, lymph nodes of inguinal region and lower limb: Secondary | ICD-10-CM

## 2023-06-18 DIAGNOSIS — Z5112 Encounter for antineoplastic immunotherapy: Secondary | ICD-10-CM | POA: Diagnosis not present

## 2023-06-18 DIAGNOSIS — Z5111 Encounter for antineoplastic chemotherapy: Secondary | ICD-10-CM | POA: Diagnosis not present

## 2023-06-18 DIAGNOSIS — Z5189 Encounter for other specified aftercare: Secondary | ICD-10-CM | POA: Diagnosis not present

## 2023-06-18 MED ORDER — PALONOSETRON HCL INJECTION 0.25 MG/5ML
0.2500 mg | Freq: Once | INTRAVENOUS | Status: AC
  Administered 2023-06-18: 0.25 mg via INTRAVENOUS
  Filled 2023-06-18: qty 5

## 2023-06-18 MED ORDER — DOXORUBICIN HCL CHEMO IV INJECTION 2 MG/ML
50.0000 mg/m2 | Freq: Once | INTRAVENOUS | Status: AC
  Administered 2023-06-18: 120 mg via INTRAVENOUS
  Filled 2023-06-18: qty 60

## 2023-06-18 MED ORDER — ACETAMINOPHEN 325 MG PO TABS
650.0000 mg | ORAL_TABLET | Freq: Once | ORAL | Status: AC
  Administered 2023-06-18: 650 mg via ORAL
  Filled 2023-06-18: qty 2

## 2023-06-18 MED ORDER — DIPHENHYDRAMINE HCL 50 MG/ML IJ SOLN
50.0000 mg | Freq: Once | INTRAMUSCULAR | Status: AC
  Administered 2023-06-18: 50 mg via INTRAVENOUS
  Filled 2023-06-18: qty 1

## 2023-06-18 MED ORDER — SODIUM CHLORIDE 0.9 % IV SOLN
150.0000 mg | Freq: Once | INTRAVENOUS | Status: AC
  Administered 2023-06-18: 150 mg via INTRAVENOUS
  Filled 2023-06-18: qty 150

## 2023-06-18 MED ORDER — SODIUM CHLORIDE 0.9 % IV SOLN
180.0000 mg | Freq: Once | INTRAVENOUS | Status: AC
  Administered 2023-06-18: 180 mg via INTRAVENOUS
  Filled 2023-06-18: qty 36

## 2023-06-18 MED ORDER — SODIUM CHLORIDE 0.9 % IV SOLN
10.0000 mg | Freq: Once | INTRAVENOUS | Status: AC
  Administered 2023-06-18: 10 mg via INTRAVENOUS
  Filled 2023-06-18: qty 10

## 2023-06-18 MED ORDER — SODIUM CHLORIDE 0.9 % IV SOLN
Freq: Once | INTRAVENOUS | Status: AC
  Filled 2023-06-18: qty 250

## 2023-06-18 MED ORDER — SODIUM CHLORIDE 0.9 % IV SOLN
750.0000 mg/m2 | Freq: Once | INTRAVENOUS | Status: AC
  Administered 2023-06-18: 2000 mg via INTRAVENOUS
  Filled 2023-06-18: qty 100

## 2023-06-18 MED ORDER — HEPARIN SOD (PORK) LOCK FLUSH 100 UNIT/ML IV SOLN
500.0000 [IU] | Freq: Once | INTRAVENOUS | Status: AC | PRN
  Administered 2023-06-18: 500 [IU]
  Filled 2023-06-18: qty 5

## 2023-06-18 NOTE — Patient Instructions (Signed)
[place] [facility]  Discharge Instructions: Thank you for choosing [facility] to provide your oncology and hematology care.  If you have a lab appointment with the Cancer Center, please go directly to the Cancer Center and check in at the registration area.  Wear comfortable clothing and clothing appropriate for easy access to any Portacath or PICC line.   We strive to give you quality time with your provider. You may need to reschedule your appointment if you arrive late (15 or more minutes).  Arriving late affects you and other patients whose appointments are after yours.  Also, if you miss three or more appointments without notifying the office, you may be dismissed from the clinic at the provider's discretion.      For prescription refill requests, have your pharmacy contact our office and allow 72 hours for refills to be completed.    Today you received the following chemotherapy and/or immunotherapy agents Treatment as planned.      To help prevent nausea and vomiting after your treatment, we encourage you to take your nausea medication as directed.  BELOW ARE SYMPTOMS THAT SHOULD BE REPORTED IMMEDIATELY: *FEVER GREATER THAN 100.4 F (38 C) OR HIGHER *CHILLS OR SWEATING *NAUSEA AND VOMITING THAT IS NOT CONTROLLED WITH YOUR NAUSEA MEDICATION *UNUSUAL SHORTNESS OF BREATH *UNUSUAL BRUISING OR BLEEDING *URINARY PROBLEMS (pain or burning when urinating, or frequent urination) *BOWEL PROBLEMS (unusual diarrhea, constipation, pain near the anus) TENDERNESS IN MOUTH AND THROAT WITH OR WITHOUT PRESENCE OF ULCERS (sore throat, sores in mouth, or a toothache) UNUSUAL RASH, SWELLING OR PAIN  UNUSUAL VAGINAL DISCHARGE OR ITCHING   Items with * indicate a potential emergency and should be followed up as soon as possible or go to the Emergency Department if any problems should occur.  Please show the CHEMOTHERAPY ALERT CARD or IMMUNOTHERAPY ALERT CARD at  check-in to the Emergency Department and triage nurse.  Should you have questions after your visit or need to cancel or reschedule your appointment, please contact [place] [facility]  [phone] and follow the prompts.  Office hours are 8:00 a.m. to 4:30 p.m. Monday - Friday. Please note that voicemails left after 4:00 p.m. may not be returned until the following business day.  We are closed weekends and major holidays. You have access to a nurse at all times for urgent questions. Please call the main number to the clinic [phone] and follow the prompts.  For any non-urgent questions, you may also contact your provider using MyChart. We now offer e-Visits for anyone 58 and older to request care online for non-urgent symptoms. For details visit [email or web address].   Also download the MyChart app! Go to the app store, search "MyChart", open the app, select [facility], and log in with your MyChart username and password.

## 2023-06-20 ENCOUNTER — Encounter: Payer: Self-pay | Admitting: Hospice and Palliative Medicine

## 2023-06-20 ENCOUNTER — Inpatient Hospital Stay (HOSPITAL_BASED_OUTPATIENT_CLINIC_OR_DEPARTMENT_OTHER): Payer: 59 | Admitting: Hospice and Palliative Medicine

## 2023-06-20 ENCOUNTER — Inpatient Hospital Stay: Payer: 59

## 2023-06-20 ENCOUNTER — Other Ambulatory Visit: Payer: Self-pay

## 2023-06-20 VITALS — BP 144/95 | HR 70 | Temp 97.9°F | Resp 20

## 2023-06-20 DIAGNOSIS — C8475 Anaplastic large cell lymphoma, ALK-negative, lymph nodes of inguinal region and lower limb: Secondary | ICD-10-CM

## 2023-06-20 DIAGNOSIS — R110 Nausea: Secondary | ICD-10-CM

## 2023-06-20 DIAGNOSIS — Z5111 Encounter for antineoplastic chemotherapy: Secondary | ICD-10-CM | POA: Diagnosis not present

## 2023-06-20 DIAGNOSIS — Z95828 Presence of other vascular implants and grafts: Secondary | ICD-10-CM | POA: Diagnosis not present

## 2023-06-20 DIAGNOSIS — R6883 Chills (without fever): Secondary | ICD-10-CM | POA: Diagnosis not present

## 2023-06-20 DIAGNOSIS — Z5189 Encounter for other specified aftercare: Secondary | ICD-10-CM | POA: Diagnosis not present

## 2023-06-20 DIAGNOSIS — E876 Hypokalemia: Secondary | ICD-10-CM

## 2023-06-20 DIAGNOSIS — Z5112 Encounter for antineoplastic immunotherapy: Secondary | ICD-10-CM | POA: Diagnosis not present

## 2023-06-20 DIAGNOSIS — R519 Headache, unspecified: Secondary | ICD-10-CM

## 2023-06-20 LAB — URINALYSIS, COMPLETE (UACMP) WITH MICROSCOPIC
Bacteria, UA: NONE SEEN
Bilirubin Urine: NEGATIVE
Glucose, UA: NEGATIVE mg/dL
Hgb urine dipstick: NEGATIVE
Ketones, ur: NEGATIVE mg/dL
Leukocytes,Ua: NEGATIVE
Nitrite: NEGATIVE
Protein, ur: NEGATIVE mg/dL
Specific Gravity, Urine: 1.017 (ref 1.005–1.030)
Squamous Epithelial / HPF: NONE SEEN /HPF (ref 0–5)
pH: 6 (ref 5.0–8.0)

## 2023-06-20 LAB — CMP (CANCER CENTER ONLY)
ALT: 47 U/L — ABNORMAL HIGH (ref 0–44)
AST: 39 U/L (ref 15–41)
Albumin: 4.1 g/dL (ref 3.5–5.0)
Alkaline Phosphatase: 79 U/L (ref 38–126)
Anion gap: 7 (ref 5–15)
BUN: 17 mg/dL (ref 6–20)
CO2: 26 mmol/L (ref 22–32)
Calcium: 9.1 mg/dL (ref 8.9–10.3)
Chloride: 106 mmol/L (ref 98–111)
Creatinine: 1.01 mg/dL (ref 0.61–1.24)
GFR, Estimated: >60 mL/min (ref >60)
Glucose, Bld: 108 mg/dL — ABNORMAL HIGH (ref 70–99)
Potassium: 3 mmol/L — ABNORMAL LOW (ref 3.5–5.1)
Sodium: 139 mmol/L (ref 135–145)
Total Bilirubin: 0.6 mg/dL (ref 0.3–1.2)
Total Protein: 6.7 g/dL (ref 6.5–8.1)

## 2023-06-20 LAB — CBC WITH DIFFERENTIAL (CANCER CENTER ONLY)
Abs Immature Granulocytes: 0.05 10*3/uL (ref 0.00–0.07)
Basophils Absolute: 0.1 10*3/uL (ref 0.0–0.1)
Basophils Relative: 0 %
Eosinophils Absolute: 0 10*3/uL (ref 0.0–0.5)
Eosinophils Relative: 0 %
HCT: 39.1 % (ref 39.0–52.0)
Hemoglobin: 13.3 g/dL (ref 13.0–17.0)
Immature Granulocytes: 0 %
Lymphocytes Relative: 7 %
Lymphs Abs: 0.8 10*3/uL (ref 0.7–4.0)
MCH: 29.4 pg (ref 26.0–34.0)
MCHC: 34 g/dL (ref 30.0–36.0)
MCV: 86.3 fL (ref 80.0–100.0)
Monocytes Absolute: 1 10*3/uL (ref 0.1–1.0)
Monocytes Relative: 9 %
Neutro Abs: 9.3 10*3/uL — ABNORMAL HIGH (ref 1.7–7.7)
Neutrophils Relative %: 84 %
Platelet Count: 418 10*3/uL — ABNORMAL HIGH (ref 150–400)
RBC: 4.53 MIL/uL (ref 4.22–5.81)
RDW: 18.9 % — ABNORMAL HIGH (ref 11.5–15.5)
WBC Count: 11.3 10*3/uL — ABNORMAL HIGH (ref 4.0–10.5)
nRBC: 0 % (ref 0.0–0.2)

## 2023-06-20 LAB — PROCALCITONIN: Procalcitonin: <0.1 ng/mL

## 2023-06-20 LAB — LACTIC ACID, PLASMA: Lactic Acid, Venous: 1.7 mmol/L (ref 0.5–1.9)

## 2023-06-20 MED ORDER — POTASSIUM CHLORIDE 20 MEQ/100ML IV SOLN
20.0000 meq | Freq: Once | INTRAVENOUS | Status: AC
  Administered 2023-06-20: 20 meq via INTRAVENOUS

## 2023-06-20 MED ORDER — POTASSIUM CHLORIDE CRYS ER 10 MEQ PO TBCR: 10.0000 meq | EXTENDED_RELEASE_TABLET | Freq: Every day | ORAL | 0 refills | Status: DC

## 2023-06-20 MED ORDER — SODIUM CHLORIDE 0.9% FLUSH
10.0000 mL | Freq: Once | INTRAVENOUS | Status: AC
  Administered 2023-06-20: 10 mL via INTRAVENOUS
  Filled 2023-06-20: qty 10

## 2023-06-20 MED ORDER — SODIUM CHLORIDE 0.9 % IV SOLN
INTRAVENOUS | Status: DC
  Filled 2023-06-20 (×3): qty 250

## 2023-06-20 MED ORDER — HEPARIN SOD (PORK) LOCK FLUSH 100 UNIT/ML IV SOLN
500.0000 [IU] | Freq: Once | INTRAVENOUS | Status: AC
  Administered 2023-06-20: 500 [IU]
  Filled 2023-06-20: qty 5

## 2023-06-20 MED ORDER — PEGFILGRASTIM-JMDB 6 MG/0.6ML ~~LOC~~ SOSY
6.0000 mg | PREFILLED_SYRINGE | Freq: Once | SUBCUTANEOUS | Status: AC
  Administered 2023-06-20: 6 mg via SUBCUTANEOUS
  Filled 2023-06-20: qty 0.6

## 2023-06-20 MED ORDER — ACETAMINOPHEN 325 MG PO TABS
650.0000 mg | ORAL_TABLET | Freq: Once | ORAL | Status: AC
  Administered 2023-06-20: 650 mg via ORAL
  Filled 2023-06-20: qty 2

## 2023-06-20 MED ORDER — AMOXICILLIN-POT CLAVULANATE 875-125 MG PO TABS: 1.0000 | ORAL_TABLET | Freq: Two times a day (BID) | ORAL | 0 refills | Status: DC

## 2023-06-20 MED ORDER — ONDANSETRON HCL 4 MG/2ML IJ SOLN
8.0000 mg | Freq: Once | INTRAMUSCULAR | Status: AC
  Administered 2023-06-20: 8 mg via INTRAVENOUS
  Filled 2023-06-20: qty 4

## 2023-06-20 NOTE — Progress Notes (Signed)
Pt added to [facility]- injection given - see mar on fluid clinic encounter.

## 2023-06-20 NOTE — Progress Notes (Signed)
[facility]pt[facility] at [facility] [phone] [phone]  Patient Care Team: Kara Dies, NP as PCP - General (Nurse Practitioner)   NAME OF PATIENT: Randy [name]  [id number]  1981/08/27   DATE OF VISIT: 06/20/23  REASON FOR CONSULT: Ivor Kishi. is a 42 y.o. 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  Patient on treatment with BV + CHP  INTERVAL HISTORY: Patient hospitalized 05/05/2023 to 05/09/2023 for sepsis unclear source.  Workup was unremarkable but patient improved.  Source of infection was suspected to be odontogenic.  Patient was readmitted 05/09/2023 to 05/10/2023 with vasovagal syncope.  Patient received day 1, cycle 4 BV plus CHP on 06/18/2023.  He is here today for Fulphila injection and was referred to [facility] for evaluation of nausea, chills, and malaise.  Patient says he woke this morning with a headache and nausea.  He took his Compazine.  On the way to the cancer center he started feeling worse with chills and fatigue.  He denies cough or congestion.  No shortness of breath or chest pain.  Denies abdominal pain or distention.  No vomiting, diarrhea, or constipation.  He says he has been urinating more frequently but attributes that to drinking more water recently. Patient offers no further specific complaints today.   PAST MEDICAL HISTORY: 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 ([name])    Migraines    Seizures ([name])     PAST SURGICAL HISTORY:  Past Surgical History:  Procedure Laterality Date   INGUINAL LYMPH NODE BIOPSY Left 03/12/2023   Procedure: INGUINAL LYMPH NODE BIOPSY excisional;  Surgeon: Carolan Shiver, MD;  Location: [facility] ORS;  Service: General;  Laterality: Left;   IR BONE MARROW BIOPSY & ASPIRATION  03/21/2023   PORTACATH PLACEMENT N/A  04/11/2023   Procedure: INSERTION PORT-A-CATH;  Surgeon: Carolan Shiver, MD;  Location: [facility] ORS;  Service: General;  Laterality: N/A;    HEMATOLOGY/ONCOLOGY HISTORY:  Oncology History  Anaplastic ALK-negative large cell lymphoma of lymph node of inguinal region ([name])  02/09/2023 Imaging   Patient has noticed left inguinal mass for at least a year, mass is gradually enlarging, some tenderness. 02/09/2023 CT abdomen pelvis with contrast showed massive lymphadenopathy of left inguinal groin, largest measures 6.1 x 5.5 x 4.4 cm  Smalt fat containing left inguinal hernia.  5 mm left solid pulmonary nodule.  Urine was negative for chlamydia and gonorrhea  patient was given empiric antibiotics and referred to establish care with oncology for further evaluation.   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  Excisional biopsy was recommended.    Cytogenetic FISH studies: DUSP22 rearrangement positive, TP63 rearrangement negative.   03/12/2023, left inguinal excisional lymph node biopsy showed CD30 positive T-cell lymphoma, most consistent with ALK negative anaplastic large cell lymphoma.    03/21/2023, bone marrow biopsy showed normocellular bone marrow for age with trilineage hematopoiesis.  No significant CD34 positive blastic population identified.  No monoclonal B-cell population or significant T-cell abnormalities identified     03/14/2023 Imaging   PET scan showed  1. Hypermetabolic left inguinal and left external iliac lymph nodes consistent with lymphomatous disease involvement. 2. Diffuse low-level FDG  avidity throughout the axial and appendicular skeleton is nonspecific likely physiologic less likely reflecting lymphomatous involvement. 3. No  splenomegaly. 4. Symmetric hypermetabolic hyperplasia of the tonsils is commonly reactive. 5. Tiny fat containing left inguinal hernia. 6. Aortic Atherosclerosis    03/22/2023 Cancer Staging   Staging form: Hodgkin and Non-Hodgkin Lymphoma, AJCC 8th Edition - Clinical stage from 03/22/2023: Stage II (Peripheral T-cell lymphoma) - Signed by Rickard Patience, MD on 04/05/2023 Stage prefix: Initial diagnosis   04/03/2023 Echocardiogram   LVEF 60 to 65%    04/12/2023 -  Chemotherapy   Patient is on Treatment Plan : LYMPHOMA  A + CHP (Brentuximab + Cyclophosphamide + Doxorubicin + Prednisone) q21 Days x 6 Cycles     04/12/2023 Procedure   Medi port placed by Dr. Maia Plan   05/05/2023 - 05/10/2023 Hospital Admission   Patient was hospitalized due to sepsis, dental caries with dental infection He was treated with vancomycin, Rocephin and Flagyl transition to cefdinir Bactrim discharged home 05/09/2023 He was readmitted the same day due to vasovagal syncope.  He was also found to have elevated LFTs was found to be likely secondary to Bactrim.  Switched to Augmentin at discharge.    Imaging   PET showed  1. Marked reduction in size and metabolic activity of LEFT inguinal lymph nodes. Deauville 2 2. No evidence of new or progressive lymphoma. 3. Normal spleen. 4. Uniform increased bone marrow activity most likely a GCSF type marrow response     ALLERGIES:  has No Known Allergies.  MEDICATIONS:  Current Outpatient Medications  Medication Sig Dispense Refill   acyclovir (ZOVIRAX) 400 MG tablet Take 1 tablet (400 mg total) by mouth 2 (two) times daily. 60 tablet 1   amoxicillin-clavulanate (AUGMENTIN) 875-125 MG tablet Take 1 tablet by mouth every 12 (twelve) hours. 14 tablet 0   chlorhexidine (PERIDEX) 0.12 % solution Use as directed 15 mLs in the mouth or throat 2 (two) times daily. 473 mL 3   ibuprofen (ADVIL) 200 MG tablet Take 400 mg by mouth every 6 (six) hours as needed for moderate pain.      lidocaine-prilocaine (EMLA) cream Apply to affected area once (Patient taking differently: Apply 1 Application topically once. Apply to affected area once) 30 g 3   ondansetron (ZOFRAN) 8 MG tablet Take 1 tablet (8 mg total) by mouth every 8 (eight) hours as needed for nausea or vomiting. Start on the third day after cyclophosphamide chemotherapy. 30 tablet 1   predniSONE (DELTASONE) 20 MG tablet Take 5 tablets (100 mg total) by mouth daily. Take on days 2-5 of chemotherapy. 20 tablet 7   prochlorperazine (COMPAZINE) 10 MG tablet Take 1 tablet (10 mg total) by mouth every 6 (six) hours as needed for nausea or vomiting. 30 tablet 6   traMADol (ULTRAM) 50 MG tablet Take 1 tablet (50 mg total) by mouth every 6 (six) hours as needed. (Patient taking differently: Take 50 mg by mouth every 6 (six) hours as needed for moderate pain.) 10 tablet 0   No current facility-administered medications for this visit.   Facility-Administered Medications Ordered in Other Visits  Medication Dose Route Frequency Provider Last Rate Last Admin   pegfilgrastim-jmdb (FULPHILA) injection 6 mg  6 mg Subcutaneous Once Rickard Patience, MD       sodium chloride flush (NS) 0.9 % injection 10 mL  10 mL Intravenous Once Covington, Sarah M, PA-C        VITAL SIGNS: There were no vitals taken for this visit.  There were no vitals filed for this visit.  Estimated body mass index is 35.09 kg/m as calculated from the following:   Height as of 06/15/23: 6\' 1"  (1.854 m).   Weight as of 06/15/23: 266 lb (120.7 kg).  LABS: CBC:    Component Value Date/Time   WBC 7.7 06/15/2023 0858   WBC 7.5 05/10/2023 0549   HGB 12.1 (L) 06/15/2023 0858   HCT 36.3 (L) 06/15/2023 0858   PLT 398 06/15/2023 0858   MCV 86.8 06/15/2023 0858   MCV 90.2 07/21/2014 1148   NEUTROABS 5.6 06/15/2023 0858   LYMPHSABS 1.0 06/15/2023 0858   MONOABS 1.0 06/15/2023 0858   EOSABS 0.0 06/15/2023 0858   BASOSABS 0.1 06/15/2023 0858   Comprehensive Metabolic  Panel:    Component Value Date/Time   NA 140 06/15/2023 0858   K 3.7 06/15/2023 0858   CL 108 06/15/2023 0858   CO2 24 06/15/2023 0858   BUN 17 06/15/2023 0858   CREATININE 0.97 06/15/2023 0858   GLUCOSE 104 (H) 06/15/2023 0858   CALCIUM 8.9 06/15/2023 0858   AST 39 06/15/2023 0858   ALT 54 (H) 06/15/2023 0858   ALKPHOS 92 06/15/2023 0858   BILITOT 0.5 06/15/2023 0858   PROT 6.8 06/15/2023 0858   ALBUMIN 3.9 06/15/2023 0858    RADIOGRAPHIC STUDIES: NM PET Image Restag (PS) Skull Base To Thigh  Result Date: 06/08/2023 CLINICAL DATA:  Subsequent treatment strategy for anaplastic large cell lymphoma of the inguinal region. EXAM: NUCLEAR MEDICINE PET SKULL BASE TO THIGH TECHNIQUE: 12.7 mCi F-18 FDG was injected intravenously. Full-ring PET imaging was performed from the skull base to thigh after the radiotracer. CT data was obtained and used for attenuation correction and anatomic localization. Fasting blood glucose: 8.5 mg/dl COMPARISON:  PET-CT 60/45/4098 FINDINGS: Mediastinal blood pool activity: SUV max 2.1 Liver activity: SUV max 0.7 NECK: No hypermetabolic lymph nodes in the neck. Incidental CT findings: None. CHEST: No hypermetabolic mediastinal or hilar nodes. No suspicious pulmonary nodules on the CT scan. Incidental CT findings: Port in the anterior chest wall with tip in distal SVC. ABDOMEN/PELVIS: Reduction size and metabolic activity of LEFT inguinal lymph nodes. The largest remaining lymph node measures 15 mm SUV max equal 2.4 which similar to background blood pool activity. Previously enlarged LEFT inguinal nodes were are intensely hypermetabolic SUV max equal 18. No iliac lymphadenopathy.  No periaortic retroperitoneal adenopathy. Normal volume of metabolic activity spleen. No evidence of malignancy in the abdomen pelvis. Incidental CT findings: Atherosclerotic calcification of the aorta. SKELETON: There is uniform increased bone marrow activity most likely a GCSF type marrow  response. Incidental CT findings: None. IMPRESSION: 1. Marked reduction in size and metabolic activity of LEFT inguinal lymph nodes. Deauville 2 2. No evidence of new or progressive lymphoma. 3. Normal spleen. 4. Uniform increased bone marrow activity most likely a GCSF type marrow response. Electronically Signed   By: Genevive Bi M.D.   On: 06/08/2023 16:23    PERFORMANCE STATUS (ECOG) : 1 - Symptomatic but completely ambulatory  Review of Systems Unless otherwise noted, a complete review of systems is negative.  Physical Exam General: NAD HEENT: Multiple dental caries, no oral abscess or facial tenderness Cardiovascular: regular rate and rhythm Pulmonary: clear anterior/posterior fields Abdomen: soft, nontender, + bowel sounds, no organomegaly GU: no suprapubic tenderness Extremities: no edema, no joint deformities Skin: no rashes Neurological: Nonfocal  IMPRESSION/PLAN: ALK negative anaplastic large cell lymphoma - on treatment with BV + CHP.  PET scan on  06/04/2023 was showed marked reduction in size and metabolic activity of left inguinal lymph nodes and no evidence of new or progressive lymphoma.  Nausea/Malaise -will give IV fluids and Zofran. Slight leukocytosis but this could be reactive from steroids.  Will send for blood cultures/UA and culture.  Lactic acid normal. Patient does not meet SIRS criteria in clinic.  However, discussed with patient and wife low threshold for sending him back to the emergency department if needed.  ED triggers reviewed in detail.  Discussed with Dr. Cathie Hoops who recommended restarting Augmentin due to recent hospitalization for sepsis.  No evidence of oral abscess currently but patient does have multiple dental caries and has been previously told that he needed tooth extraction.  Wife plans to coordinate scheduling with oral surgeon.  Hypokalemia -proceed with IV repletion. Start oral supplementation.   Return next week for labs/fluids  Patient  expressed understanding and was in agreement with this plan. He also understands that He can call clinic at any time with any questions, concerns, or complaints.   Thank you for allowing me to participate in the care of this very pleasant patient.   Time Total: 20 minutes  Visit consisted of counseling and education dealing with the complex and emotionally intense issues of symptom management in the setting of serious illness.Greater than 50%  of this time was spent counseling and coordinating care related to the above assessment and plan.  Signed by: Laurette Schimke, PhD, NP-C

## 2023-06-21 LAB — URINE CULTURE: Culture: NO GROWTH

## 2023-06-22 LAB — CULTURE, BLOOD (ROUTINE X 2): Special Requests: ADEQUATE

## 2023-06-23 LAB — CULTURE, BLOOD (ROUTINE X 2): Culture: NO GROWTH

## 2023-06-25 LAB — CULTURE, BLOOD (ROUTINE X 2): Culture: NO GROWTH

## 2023-06-27 ENCOUNTER — Inpatient Hospital Stay: Payer: 59

## 2023-06-27 ENCOUNTER — Inpatient Hospital Stay: Payer: 59 | Attending: Oncology

## 2023-06-27 ENCOUNTER — Telehealth: Payer: Self-pay | Admitting: *Deleted

## 2023-06-27 NOTE — Telephone Encounter (Signed)
Left vm for patient. He missed his fluid clinic apt today. I called to f/u and to see how patient was feeling.

## 2023-06-28 ENCOUNTER — Other Ambulatory Visit: Payer: Self-pay

## 2023-07-06 MED FILL — Dexamethasone Sodium Phosphate Inj 100 MG/10ML: INTRAMUSCULAR | Qty: 1 | Status: AC

## 2023-07-09 ENCOUNTER — Inpatient Hospital Stay: Payer: 59

## 2023-07-09 ENCOUNTER — Inpatient Hospital Stay: Payer: 59 | Admitting: Oncology

## 2023-07-09 MED FILL — Fosaprepitant Dimeglumine For IV Infusion 150 MG (Base Eq): INTRAVENOUS | Qty: 5 | Status: AC

## 2023-07-09 NOTE — Assessment & Plan Note (Deleted)
 Chemotherapy plan as listed above 

## 2023-07-09 NOTE — Assessment & Plan Note (Deleted)
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  On BV + CHP , PET3 showed CR, Deuville 2  Recommend patient to finish 3 more cycles of BV+ CHP Labs are reviewed and discussed with patient. Proceed with cycle 5  BV-CHP with D3 GCSF, he will take Prednisone D2-5.  History of genitle herpes, recommend acyclovir prophylaxis. 400mg  BID

## 2023-07-12 ENCOUNTER — Inpatient Hospital Stay: Payer: 59

## 2023-07-30 ENCOUNTER — Inpatient Hospital Stay: Payer: 59

## 2023-07-30 ENCOUNTER — Other Ambulatory Visit: Payer: Self-pay | Admitting: Oncology

## 2023-07-30 ENCOUNTER — Inpatient Hospital Stay: Payer: 59 | Attending: Oncology | Admitting: Oncology

## 2023-07-30 ENCOUNTER — Encounter: Payer: Self-pay | Admitting: Oncology

## 2023-07-30 NOTE — Assessment & Plan Note (Deleted)
Continue antiemetics PRN 

## 2023-07-30 NOTE — Assessment & Plan Note (Deleted)
At risk of developing recurrent tooth infection. Recommend patient follow up with oral surgeon.

## 2023-07-30 NOTE — Assessment & Plan Note (Deleted)
Chemotherapy plan as listed above 

## 2023-07-30 NOTE — Assessment & Plan Note (Deleted)
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  On BV + CHP , PET3 showed CR, Deuville 2  Recommend patient to finish 3 more cycles of BV+ CHP Labs are reviewed and discussed with patient. Proceed with cycle 6  BV-CHP with D3 GCSF, he will take Prednisone D2-5.  History of genitle herpes, recommend acyclovir prophylaxis. 400mg  BID

## 2023-08-02 ENCOUNTER — Ambulatory Visit: Payer: 59

## 2024-04-01 ENCOUNTER — Other Ambulatory Visit: Payer: Self-pay

## 2024-04-04 ENCOUNTER — Encounter: Payer: Self-pay | Admitting: Oncology

## 2024-04-10 ENCOUNTER — Other Ambulatory Visit: Payer: Self-pay

## 2024-04-10 DIAGNOSIS — C8475 Anaplastic large cell lymphoma, ALK-negative, lymph nodes of inguinal region and lower limb: Secondary | ICD-10-CM

## 2024-04-11 ENCOUNTER — Inpatient Hospital Stay (HOSPITAL_BASED_OUTPATIENT_CLINIC_OR_DEPARTMENT_OTHER): Admitting: Oncology

## 2024-04-11 ENCOUNTER — Encounter: Payer: Self-pay | Admitting: Oncology

## 2024-04-11 ENCOUNTER — Inpatient Hospital Stay: Attending: Oncology

## 2024-04-11 VITALS — BP 151/113 | HR 89 | Temp 97.7°F | Resp 18 | Wt 292.2 lb

## 2024-04-11 DIAGNOSIS — A6001 Herpesviral infection of penis: Secondary | ICD-10-CM | POA: Diagnosis not present

## 2024-04-11 DIAGNOSIS — R7989 Other specified abnormal findings of blood chemistry: Secondary | ICD-10-CM | POA: Insufficient documentation

## 2024-04-11 DIAGNOSIS — C8470 Anaplastic large cell lymphoma, ALK-negative, unspecified site: Secondary | ICD-10-CM | POA: Diagnosis present

## 2024-04-11 DIAGNOSIS — C8475 Anaplastic large cell lymphoma, ALK-negative, lymph nodes of inguinal region and lower limb: Secondary | ICD-10-CM

## 2024-04-11 DIAGNOSIS — F17290 Nicotine dependence, other tobacco product, uncomplicated: Secondary | ICD-10-CM | POA: Diagnosis not present

## 2024-04-11 DIAGNOSIS — Z95828 Presence of other vascular implants and grafts: Secondary | ICD-10-CM

## 2024-04-11 DIAGNOSIS — A6000 Herpesviral infection of urogenital system, unspecified: Secondary | ICD-10-CM | POA: Insufficient documentation

## 2024-04-11 LAB — CBC WITH DIFFERENTIAL (CANCER CENTER ONLY)
Abs Immature Granulocytes: 0.02 10*3/uL (ref 0.00–0.07)
Basophils Absolute: 0.1 10*3/uL (ref 0.0–0.1)
Basophils Relative: 1 %
Eosinophils Absolute: 0.3 10*3/uL (ref 0.0–0.5)
Eosinophils Relative: 4 %
HCT: 47 % (ref 39.0–52.0)
Hemoglobin: 15.6 g/dL (ref 13.0–17.0)
Immature Granulocytes: 0 %
Lymphocytes Relative: 28 %
Lymphs Abs: 1.9 10*3/uL (ref 0.7–4.0)
MCH: 28.8 pg (ref 26.0–34.0)
MCHC: 33.2 g/dL (ref 30.0–36.0)
MCV: 86.9 fL (ref 80.0–100.0)
Monocytes Absolute: 0.6 10*3/uL (ref 0.1–1.0)
Monocytes Relative: 9 %
Neutro Abs: 3.9 10*3/uL (ref 1.7–7.7)
Neutrophils Relative %: 58 %
Platelet Count: 220 10*3/uL (ref 150–400)
RBC: 5.41 MIL/uL (ref 4.22–5.81)
RDW: 14.1 % (ref 11.5–15.5)
WBC Count: 6.8 10*3/uL (ref 4.0–10.5)
nRBC: 0 % (ref 0.0–0.2)

## 2024-04-11 LAB — CMP (CANCER CENTER ONLY)
ALT: 50 U/L — ABNORMAL HIGH (ref 0–44)
AST: 46 U/L — ABNORMAL HIGH (ref 15–41)
Albumin: 4.2 g/dL (ref 3.5–5.0)
Alkaline Phosphatase: 89 U/L (ref 38–126)
Anion gap: 8 (ref 5–15)
BUN: 15 mg/dL (ref 6–20)
CO2: 25 mmol/L (ref 22–32)
Calcium: 9.2 mg/dL (ref 8.9–10.3)
Chloride: 105 mmol/L (ref 98–111)
Creatinine: 1.09 mg/dL (ref 0.61–1.24)
GFR, Estimated: >60 mL/min (ref >60)
Glucose, Bld: 113 mg/dL — ABNORMAL HIGH (ref 70–99)
Potassium: 3.8 mmol/L (ref 3.5–5.1)
Sodium: 138 mmol/L (ref 135–145)
Total Bilirubin: 0.8 mg/dL (ref 0.0–1.2)
Total Protein: 7.1 g/dL (ref 6.5–8.1)

## 2024-04-11 LAB — LACTATE DEHYDROGENASE: LDH: 218 U/L — ABNORMAL HIGH (ref 98–192)

## 2024-04-11 MED ORDER — HEPARIN SOD (PORK) LOCK FLUSH 100 UNIT/ML IV SOLN
500.0000 [IU] | Freq: Once | INTRAVENOUS | Status: AC
  Administered 2024-04-11: 500 [IU] via INTRAVENOUS
  Filled 2024-04-11: qty 5

## 2024-04-11 MED ORDER — SODIUM CHLORIDE 0.9% FLUSH
10.0000 mL | Freq: Once | INTRAVENOUS | Status: AC
  Administered 2024-04-11: 10 mL via INTRAVENOUS
  Filled 2024-04-11: qty 10

## 2024-04-11 NOTE — Assessment & Plan Note (Signed)
 Previously he was off acyclovir  400 twice daily. Recommend patient keep regular follow-up appointment with primary care provider for further evaluation.

## 2024-04-11 NOTE — Assessment & Plan Note (Signed)
 Stable.  Monitor.

## 2024-04-11 NOTE — Progress Notes (Signed)
 Hematology/Oncology Progress note [phone] [phone]      CHIEF COMPLAINTS/PURPOSE OF CONSULTATION:  ALK negative, anaplastic large cell lymphoma.   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  Total inpatient had 4 cycles of BV-CHP with D3 GCSF PET3 showed CR, [place] 2  Patient lost follow-up after cycle 4 chemotherapy treatment. No constitutional symptoms.   He has not had PET scan evaluation after 4th chemotherapy treatment. I will check PET scan  check CBC, CMP, LDH.  Genital herpes Previously he was off acyclovir  400 twice daily. Recommend patient keep regular follow-up appointment with primary care provider for further evaluation.  Elevated LFTs Stable.  Monitor.    Orders Placed This Encounter  Procedures   NM PET Image Restag (PS) Skull Base To Thigh    Standing Status:   Future    Expected Date:   04/18/2024    Expiration Date:   04/11/2025    If indicated for the ordered procedure, I authorize the administration of a radiopharmaceutical per Radiology protocol:   Yes    Preferred imaging location?:   [facility]    Follow up  3 weeks lab MD BV-CHP D3 GSCF All questions were answered. The patient knows to call the clinic with any problems, questions or concerns.  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 02/09/2023 CT abdomen pelvis with contrast showed massive lymphadenopathy of left inguinal groin, largest measures 6.1 x 5.5 x 4.4 cm  Smalt fat containing left inguinal hernia.  5 mm left solid pulmonary nodule.  Urine was negative for chlamydia and gonorrhea  patient was given empiric antibiotics and referred to establish care with oncology for further evaluation.   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  Excisional biopsy was recommended.    Cytogenetic FISH studies: DUSP22 rearrangement positive, TP63 rearrangement negative.   03/12/2023, left inguinal excisional lymph node biopsy showed CD30 positive T-cell lymphoma, most consistent with ALK negative anaplastic large cell lymphoma.    03/21/2023, bone marrow biopsy showed normocellular bone marrow for age with trilineage hematopoiesis.  No significant CD34 positive blastic population identified.  No monoclonal B-cell population or significant T-cell abnormalities identified     03/14/2023 Imaging   PET scan showed  1. Hypermetabolic left inguinal and left external iliac lymph nodes consistent with lymphomatous disease involvement. 2. Diffuse low-level FDG avidity throughout the axial and appendicular skeleton is nonspecific likely physiologic less likely reflecting lymphomatous involvement. 3. No splenomegaly. 4. Symmetric hypermetabolic hyperplasia of the tonsils is commonly reactive. 5. Tiny fat containing left inguinal hernia. 6. Aortic Atherosclerosis    03/22/2023 Cancer Staging  Staging form: Hodgkin and Non-Hodgkin Lymphoma, AJCC 8th Edition - Clinical stage from 03/22/2023: Stage II  (Peripheral T-cell lymphoma) - Signed by Randy Forbes, MD on 04/05/2023 Stage prefix: Initial diagnosis   04/03/2023 Echocardiogram   LVEF 60 to 65%    04/12/2023 -  Chemotherapy   Patient is on Treatment Plan : LYMPHOMA  A + CHP (Brentuximab + Cyclophosphamide  + Doxorubicin  + Prednisone ) q21 Days x 6 Cycles     04/12/2023 Procedure   Medi port placed by Randy [name]   05/05/2023 - 05/10/2023 [name] Admission   Patient was hospitalized due to sepsis, dental caries with dental infection He was treated with vancomycin , Rocephin  and Flagyl  transition to cefdinir  Bactrim  discharged home 05/09/2023 He was readmitted the same day due to vasovagal syncope.  He was also found to have elevated LFTs was found to be likely secondary to Bactrim .  Switched to Augmentin  at discharge.   06/03/2024 Imaging   PET showed  1. Marked reduction in size and metabolic activity of LEFT inguinal lymph nodes. Deauville 2 2. No evidence of new or progressive lymphoma. 3. Normal spleen. 4. Uniform increased bone marrow activity most likely a GCSF type marrow response    Reviewed previous medical history. 01/08/2014-01/08/2021, patient presented emergency room for headache,.  LP was done which showed lymphocytic predominant pleocytosis, HSV 2+, RPR reactive 1:4, HIV and crypto Ag negative.  He was treated with HSV meningitis with acyclovir .  Patient also was treated for skin eruptions secondary syphilis with penicillin  injections.   INTERVAL HISTORY [name]. is a 43 y.o. male who has above history reviewed by me today presents for follow up visit for Anaplastic ALK-negative large cell lymphoma  Patient has lost follow-up since June 2024.  He presented to establish care. Patient denies nausea vomiting diarrhea, unintentional weight loss, night sweats, fever. He has noticed a very small vesicle on his penis.  History of genital herpes and was previously on acyclovir .  Recently he ran out of medication has been off.   No fever or chills.  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 ([name])    Migraines    Seizures ([name])     SURGICAL HISTORY: Past Surgical History:  Procedure Laterality Date   INGUINAL LYMPH NODE BIOPSY Left 03/12/2023   Procedure: INGUINAL LYMPH NODE BIOPSY excisional;  Surgeon: Eldred Grego, MD;  Location: [facility] ORS;  Service: General;  Laterality: Left;   IR BONE MARROW BIOPSY & ASPIRATION  03/21/2023   PORTACATH PLACEMENT N/A 04/11/2023   Procedure: INSERTION PORT-A-CATH;  Surgeon: Eldred Grego, MD;  Location: [facility] ORS;  Service: General;  Laterality: N/A;    SOCIAL HISTORY: Social History   Socioeconomic History   Marital status: Married    Spouse name: Not on file   Number of children: Not on file   Years of education: Not on file   Highest education level: Not on file  Occupational History   Not on file  Tobacco Use   Smoking status: Former    Current packs/day: 0.00    Types: Cigarettes    Start date: 03/26/2007    Quit date: 03/25/2022    Years since quitting: 2.0   Smokeless tobacco: Not on file  Vaping Use   Vaping status: Some Days   Start date: 03/27/2023  Substance and Sexual Activity   Alcohol use: Yes    Comment: occasionaly 1-2 a year   Drug  use: Yes    Frequency: 5.0 times per week    Types: Marijuana    Comment: smoke 4-5 times a week.   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 He is a Curator.    Social Drivers of Corporate investment banker Strain: Low Risk  (02/13/2023)   Overall Financial Resource Strain (CARDIA)    Difficulty of Paying Living Expenses: Not hard at all  Food Insecurity: No Food Insecurity (05/10/2023)   Hunger Vital Sign    Worried About Running Out of Food in the Last Year: Never true    Ran Out of Food in the Last  Year: Never true  Transportation Needs: No Transportation Needs (05/10/2023)   PRAPARE - Administrator, Civil Service (Medical): No    Lack of Transportation (Non-Medical): No  Physical Activity: Not on file  Stress: No Stress Concern Present (02/13/2023)   [employer] of Occupational Health - Occupational Stress Questionnaire    Feeling of Stress : Not at all  Social Connections: Not on file  Intimate Partner Violence: Not At Risk (05/10/2023)   Humiliation, Afraid, Rape, and Kick questionnaire    Fear of Current or Ex-Partner: No    Emotionally Abused: No    Physically Abused: No    Sexually Abused: No    FAMILY HISTORY: Family History  Problem Relation Age of Onset   Diabetes Mother    Hypertension Mother    Hypertension Father    Stroke Father    Diabetes Father    Cancer Paternal Grandmother        breat cancer    ALLERGIES:  has no known allergies.  MEDICATIONS:  Current Outpatient Medications  Medication Sig Dispense Refill   acyclovir  (ZOVIRAX ) 400 MG tablet Take 1 tablet (400 mg total) by mouth 2 (two) times daily. (Patient not taking: Reported on 04/11/2024) 60 tablet 1   amoxicillin -clavulanate (AUGMENTIN ) 875-125 MG tablet Take 1 tablet by mouth 2 (two) times daily. (Patient not taking: Reported on 04/11/2024) 14 tablet 0   chlorhexidine  (PERIDEX ) 0.12 % solution Use as directed 15 mLs in the mouth or throat 2 (two) times daily. (Patient not taking: Reported on 04/11/2024) 473 mL 3   ibuprofen  (ADVIL ) 200 MG tablet Take 400 mg by mouth every 6 (six) hours as needed for moderate pain. (Patient not taking: Reported on 04/11/2024)     lidocaine -prilocaine  (EMLA ) cream Apply to affected area once (Patient not taking: Reported on 04/11/2024) 30 g 3   ondansetron  (ZOFRAN ) 8 MG tablet Take 1 tablet (8 mg total) by mouth every 8 (eight) hours as needed for nausea or vomiting. Start on the third day after cyclophosphamide  chemotherapy. (Patient not taking:  Reported on 04/11/2024) 30 tablet 1   potassium chloride  (KLOR-CON  M) 10 MEQ tablet Take 1 tablet (10 mEq total) by mouth daily. (Patient not taking: Reported on 04/11/2024) 15 tablet 0   predniSONE  (DELTASONE ) 20 MG tablet Take 5 tablets (100 mg total) by mouth daily. Take on days 2-5 of chemotherapy. (Patient not taking: Reported on 04/11/2024) 20 tablet 7   prochlorperazine  (COMPAZINE ) 10 MG tablet Take 1 tablet (10 mg total) by mouth every 6 (six) hours as needed for nausea or vomiting. (Patient not taking: Reported on 04/11/2024) 30 tablet 6   traMADol  (ULTRAM ) 50 MG tablet Take 1 tablet (50 mg total) by mouth every 6 (  six) hours as needed. (Patient not taking: Reported on 04/11/2024) 10 tablet 0   No current facility-administered medications for this visit.    Review of Systems  Constitutional:  Negative for appetite change, chills, fatigue, fever and unexpected weight change.  HENT:   Negative for hearing loss and voice change.   Eyes:  Negative for eye problems and icterus.  Respiratory:  Negative for chest tightness, cough and shortness of breath.   Cardiovascular:  Negative for chest pain and leg swelling.  Gastrointestinal:  Negative for abdominal distention and abdominal pain.  Endocrine: Negative for hot flashes.  Genitourinary:  Negative for difficulty urinating, dysuria and frequency.   Musculoskeletal:  Negative for arthralgias.  Skin:  Negative for itching and rash.  Neurological:  Negative for light-headedness and numbness.  Hematological:  Negative for adenopathy. Does not bruise/bleed easily.  Psychiatric/Behavioral:  Negative for confusion.   CMA Melodie Spry presents for chaperon.  A small vesicle on patient's penis   PHYSICAL EXAMINATION: ECOG PERFORMANCE STATUS: 0 - Asymptomatic  Vitals:   04/11/24 1026 04/11/24 1034  BP: (!) 157/114 (!) 151/113  Pulse: 89   Resp: 18   Temp: 97.7 F (36.5 C)   SpO2: 96%    Filed Weights   04/11/24 1026  Weight: 292 lb 3.2 oz  (132.5 kg)    Physical Exam Constitutional:      General: He is not in acute distress. HENT:     Head: Normocephalic and atraumatic.  Eyes:     General: No scleral icterus. Cardiovascular:     Rate and Rhythm: Normal rate and regular rhythm.     Heart sounds: No murmur heard. Pulmonary:     Effort: Pulmonary effort is normal. No respiratory distress.  Abdominal:     General: There is no distension.     Palpations: Abdomen is soft.  Genitourinary:    Comments: No palpable inguinal massive lymphadenopathy  Musculoskeletal:        General: Normal range of motion.     Cervical back: Normal range of motion and neck supple.  Skin:    General: Skin is warm and dry.     Findings: No erythema.  Neurological:     Mental Status: He is alert and oriented to person, place, and time. Mental status is at baseline.     Cranial Nerves: No cranial nerve deficit.     Motor: No abnormal muscle tone.  Psychiatric:        Mood and Affect: Mood and affect normal.     RN Carney Christine served as chaperone during GU examination LABORATORY DATA:  I have reviewed the data as listed    Latest Ref Rng & Units 04/11/2024   10:17 AM 06/20/2023   10:22 AM 06/15/2023    8:58 AM  CBC  WBC 4.0 - 10.5 K/uL 6.8  11.3  7.7   Hemoglobin 13.0 - 17.0 g/dL 16.1  09.6  04.5   Hematocrit 39.0 - 52.0 % 47.0  39.1  36.3   Platelets 150 - 400 K/uL 220  418  398       Latest Ref Rng & Units 04/11/2024   10:17 AM 06/20/2023   10:22 AM 06/15/2023    8:58 AM  CMP  Glucose 70 - 99 mg/dL 409  811  914   BUN 6 - 20 mg/dL 15  17  17    Creatinine 0.61 - 1.24 mg/dL 7.82  9.56  2.13   Sodium 135 - 145 mmol/L 138  139  140   Potassium 3.5 - 5.1 mmol/L 3.8  3.0  3.7   Chloride 98 - 111 mmol/L 105  106  108   CO2 22 - 32 mmol/L 25  26  24    Calcium 8.9 - 10.3 mg/dL 9.2  9.1  8.9   Total Protein 6.5 - 8.1 g/dL 7.1  6.7  6.8   Total Bilirubin 0.0 - 1.2 mg/dL 0.8  0.6  0.5   Alkaline Phos 38 - 126 U/L 89  79  92   AST  15 - 41 U/L 46  39  39   ALT 0 - 44 U/L 50  47  54      RADIOGRAPHIC STUDIES: I have personally reviewed the radiological images as listed and agreed with the findings in the report. No results found.

## 2024-04-11 NOTE — Assessment & Plan Note (Addendum)
 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  Total inpatient had 4 cycles of BV-CHP with D3 GCSF PET3 showed CR, [place] 2  Patient lost follow-up after cycle 4 chemotherapy treatment. No constitutional symptoms.   He has not had PET scan evaluation after 4th chemotherapy treatment. I will check PET scan  check CBC, CMP, LDH.

## 2024-04-12 ENCOUNTER — Other Ambulatory Visit: Payer: Self-pay

## 2024-04-18 ENCOUNTER — Ambulatory Visit
Admission: RE | Admit: 2024-04-18 | Discharge: 2024-04-18 | Disposition: A | Discharge status: Home / Self Care | Source: Ambulatory Visit | Attending: Oncology | Admitting: Oncology

## 2024-04-18 DIAGNOSIS — R590 Localized enlarged lymph nodes: Secondary | ICD-10-CM | POA: Diagnosis not present

## 2024-04-18 DIAGNOSIS — K760 Fatty (change of) liver, not elsewhere classified: Secondary | ICD-10-CM | POA: Insufficient documentation

## 2024-04-18 DIAGNOSIS — C8475 Anaplastic large cell lymphoma, ALK-negative, lymph nodes of inguinal region and lower limb: Secondary | ICD-10-CM | POA: Insufficient documentation

## 2024-04-18 DIAGNOSIS — K4090 Unilateral inguinal hernia, without obstruction or gangrene, not specified as recurrent: Secondary | ICD-10-CM | POA: Insufficient documentation

## 2024-04-18 LAB — GLUCOSE, CAPILLARY: Glucose-Capillary: 113 mg/dL — ABNORMAL HIGH (ref 70–99)

## 2024-04-18 MED ORDER — FLUDEOXYGLUCOSE F - 18 (FDG) INJECTION
12.8000 | Freq: Once | INTRAVENOUS | Status: AC | PRN
Start: 2024-04-18 — End: 2024-04-18
  Administered 2024-04-18: 12.8 via INTRAVENOUS

## 2024-05-02 ENCOUNTER — Telehealth: Payer: Self-pay

## 2024-05-02 NOTE — Telephone Encounter (Signed)
-----   Message from [name] sent at 05/01/2024  7:47 PM EDT ----- Please arrange him to see me MD only to go over results.

## 2024-05-02 NOTE — Telephone Encounter (Signed)
 Please schedule pt for MD only to review PET scan results.

## 2024-05-03 ENCOUNTER — Other Ambulatory Visit: Payer: Self-pay

## 2024-05-06 ENCOUNTER — Inpatient Hospital Stay: Attending: Oncology | Admitting: Oncology

## 2024-05-06 ENCOUNTER — Encounter: Payer: Self-pay | Admitting: Oncology

## 2024-05-06 ENCOUNTER — Ambulatory Visit: Payer: Self-pay

## 2024-05-06 VITALS — BP 151/106 | HR 86 | Temp 97.8°F | Resp 20 | Wt 293.5 lb

## 2024-05-06 DIAGNOSIS — R7989 Other specified abnormal findings of blood chemistry: Secondary | ICD-10-CM | POA: Insufficient documentation

## 2024-05-06 DIAGNOSIS — A6001 Herpesviral infection of penis: Secondary | ICD-10-CM

## 2024-05-06 DIAGNOSIS — F17290 Nicotine dependence, other tobacco product, uncomplicated: Secondary | ICD-10-CM | POA: Insufficient documentation

## 2024-05-06 DIAGNOSIS — C8475 Anaplastic large cell lymphoma, ALK-negative, lymph nodes of inguinal region and lower limb: Secondary | ICD-10-CM | POA: Insufficient documentation

## 2024-05-06 NOTE — Assessment & Plan Note (Signed)
 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  Total inpatient had 4 cycles of BV-CHP with D3 GCSF 05/01/2024 PET3 showed CR, [place] 2  Patient lost follow-up after 4 cycles chemotherapy treatment. 04/18/2024 PET showed CR, slight increase of uptake of left inguinal LN. [place] 3 He has no constitutional symptoms, still in remission.  Discussed with patient about radiation vs observation.  No randomized trials have compared observation versus consolidative RT in this setting, and only limited retrospective data are available Consolidative RT was associated with a trend toward better outcomes  I will refer him to establish care with Radonc for evaluation.

## 2024-05-06 NOTE — Telephone Encounter (Signed)
  Chief Complaint: rash to right shin Symptoms: itching  Frequency: on and off for a year Pertinent Negatives: Patient denies fever, red streaks Disposition: [] ED /[] Urgent Care (no appt availability in office) / [x] Appointment(In office/virtual)/ []  [facility]/ [] Home Care/ [] Refused Recommended Disposition /[] [facility]/ []  Follow-up with PCP Additional Notes: needs sooner appt Copied from CRM [id number]. Topic: Clinical - Red Word Triage >> May 06, 2024  9:03 AM Alethia Huxley E wrote: [facility] that prompted transfer to Nurse Triage: Rash. Patient stated his cancer provider wanted him to get checked out for rash on his right leg that is itchy and comes and goes. Patient stated he does have herpes so unknown if related. Reason for Disposition  [1] Severe localized itching AND [2] after 2 days of steroid cream  Answer Assessment - Initial Assessment Questions 1. APPEARANCE of RASH: "Describe the rash."      Dark spot that gets dry and itches 2/2.5 inches in doameter2. LOCATION: "Where is the rash located?"      Right size 3. NUMBER: "How many spots are there?"      *No Answer* 4. SIZE: "How big are the spots?" (Inches, centimeters or compare to size of a coin)      *No Answer* 5. ONSET: "When did the rash start?"      1 year 6. ITCHING: "Does the rash itch?" If Yes, ask: "How bad is the itch?"  (Scale 0-10; or none, mild, moderate, severe)     Yes- occasional comes and goes 7. PAIN: "Does the rash hurt?" If Yes, ask: "How bad is the pain?"  (Scale 0-10; or none, mild, moderate, severe)    - NONE (0): no pain    - MILD (1-3): doesn't interfere with normal activities     - MODERATE (4-7): interferes with normal activities or awakens from sleep     - SEVERE (8-10): excruciating pain, unable to do any normal activities     no 8. OTHER SYMPTOMS: "Do you have any other symptoms?" (e.g., fever)     no  Protocols used: Rash or Redness - Localized-A-AH

## 2024-05-06 NOTE — Assessment & Plan Note (Addendum)
 Stable.  Monitor. Likely due to fatty liver disease.  Health diet and exercise.

## 2024-05-06 NOTE — Progress Notes (Signed)
 Hematology/Oncology Progress note [phone] [phone]      CHIEF COMPLAINTS/PURPOSE OF CONSULTATION:  ALK negative, anaplastic large cell lymphoma.   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  Total inpatient had 4 cycles of BV-CHP with D3 GCSF 05/01/2024 PET3 showed CR, Deuville 2  Patient lost follow-up after 4 cycles chemotherapy treatment. 04/18/2024 PET showed CR, slight increase of uptake of left inguinal LN. Deuville 3 He has no constitutional symptoms, still in remission.  Discussed with patient about radiation vs observation.  No randomized trials have compared observation versus consolidative RT in this setting, and only limited retrospective data are available Consolidative RT was associated with a trend toward better outcomes  I will refer him to establish care with Radonc for evaluation.   Elevated LFTs Stable.  Monitor. Likely due to fatty liver disease.  Health diet and exercise.   Genital herpes Previously he was off acyclovir  400 twice daily. Recommend patient keep regular follow-up appointment with primary care provider for further evaluation.    Orders Placed This Encounter  Procedures   NM PET Image Restag (PS) Skull Base To Thigh    Standing Status:   Future    Expected Date:   11/06/2024    Expiration Date:   05/06/2025    If indicated for the ordered procedure, I authorize the administration of a radiopharmaceutical per Radiology protocol:   Yes    Preferred imaging location?:   [name]   CBC with Differential (Cancer Center Only)    Standing Status:   Future    Expected Date:   11/06/2024    Expiration Date:   05/06/2025   CMP (Cancer Center only)    Standing Status:   Future    Expected Date:   11/06/2024    Expiration Date:   05/06/2025   Lactate dehydrogenase    Standing Status:   Future    Expected Date:   11/06/2024    Expiration Date:   05/06/2025   Ambulatory referral to Radiation Oncology    Referral Priority:   Routine    Referral Type:   Consultation    Referral Reason:   Specialty Services Required    Requested Specialty:   Radiation Oncology    Number of Visits Requested:   1    Follow up  6 months with repeat PET All questions were answered. The patient knows to call the clinic with any problems, questions or concerns.  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 02/09/2023 CT abdomen pelvis with contrast showed massive lymphadenopathy of left inguinal groin, largest measures 6.1 x 5.5 x 4.4 cm  Smalt fat containing left inguinal hernia.  5 mm left solid pulmonary nodule.  Urine was negative for chlamydia and gonorrhea  patient was given empiric antibiotics and referred to establish care with  oncology for further evaluation.   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  Excisional biopsy was recommended.    Cytogenetic FISH studies: DUSP22 rearrangement positive, TP63 rearrangement negative.   03/12/2023, left inguinal  excisional lymph node biopsy showed CD30 positive T-cell lymphoma, most consistent with ALK negative anaplastic large cell lymphoma.    03/21/2023, bone marrow biopsy showed normocellular bone marrow for age with trilineage hematopoiesis.  No significant CD34 positive blastic population identified.  No monoclonal B-cell population or significant T-cell abnormalities identified     03/14/2023 Imaging   PET scan showed  1. Hypermetabolic left inguinal and left external iliac lymph nodes consistent with lymphomatous disease involvement. 2. Diffuse low-level FDG avidity throughout the axial and appendicular skeleton is nonspecific likely physiologic less likely reflecting lymphomatous involvement. 3. No splenomegaly. 4. Symmetric hypermetabolic hyperplasia of the tonsils is commonly reactive. 5. Tiny fat containing left inguinal hernia. 6. Aortic Atherosclerosis    03/22/2023 Cancer Staging   Staging form: Hodgkin and Non-Hodgkin Lymphoma, AJCC 8th Edition - Clinical stage from 03/22/2023: Stage II (Peripheral T-cell lymphoma) - Signed by [name], MD on 04/05/2023 Stage prefix: Initial diagnosis   04/03/2023 Echocardiogram   LVEF 60 to 65%    04/12/2023 -  Chemotherapy   Patient is on Treatment Plan : LYMPHOMA  A + CHP (Brentuximab + Cyclophosphamide  + Doxorubicin  + Prednisone ) q21 Days x 6 Cycles     04/12/2023 Procedure   Medi port placed by Dr. Charmel Cooter   05/05/2023 - 05/10/2023 Hospital Admission   Patient was hospitalized due to sepsis, dental caries with dental infection He was treated with vancomycin , Rocephin  and Flagyl  transition to cefdinir  Bactrim  discharged home 05/09/2023 He was readmitted the same day due to vasovagal syncope.  He was also found to have elevated LFTs was found to be likely secondary to Bactrim .  Switched to Augmentin  at discharge.   06/03/2024 Imaging   PET showed  1. Marked reduction in size and metabolic activity of LEFT inguinal lymph nodes. Deauville 2 2. No  evidence of new or progressive lymphoma. 3. Normal spleen. 4. Uniform increased bone marrow activity most likely a GCSF type marrow response    Reviewed previous medical history. 01/08/2014-01/08/2021, patient presented emergency room for headache,.  LP was done which showed lymphocytic predominant pleocytosis, HSV 2+, RPR reactive 1:4, HIV and crypto Ag negative.  He was treated with HSV meningitis with acyclovir .  Patient also was treated for skin eruptions secondary syphilis with penicillin  injections.   INTERVAL HISTORY Randy [name]. is a 43 y.o. male who has above history reviewed by me today presents for follow up visit for Anaplastic ALK-negative large cell lymphoma  Patient has lost follow-up since June 2024.  He presented to establish care. Patient denies nausea vomiting diarrhea, unintentional weight loss, night sweats, fever. He has noticed a very small vesicle on his penis.  History of genital herpes and was previously on acyclovir .  Recently he ran out of medication has been off.  No fever or chills.  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 ([name])    Migraines    Seizures ([name])     SURGICAL HISTORY: Past Surgical History:  Procedure Laterality Date   INGUINAL  LYMPH NODE BIOPSY Left 03/12/2023   Procedure: INGUINAL LYMPH NODE BIOPSY excisional;  Surgeon: Eldred Grego, MD;  Location: [facility] ORS;  Service: General;  Laterality: Left;   IR BONE MARROW BIOPSY & ASPIRATION  03/21/2023   PORTACATH PLACEMENT N/A 04/11/2023   Procedure: INSERTION PORT-A-CATH;  Surgeon: Eldred Grego, MD;  Location: [facility] ORS;  Service: General;  Laterality: N/A;    SOCIAL HISTORY: Social History   Socioeconomic History   Marital status: Married    Spouse name: Not on file   Number of children: Not on file   Years of education: Not on file   Highest education level: Not on file   Occupational History   Not on file  Tobacco Use   Smoking status: Former    Current packs/day: 0.00    Types: Cigarettes    Start date: 03/26/2007    Quit date: 03/25/2022    Years since quitting: 2.1   Smokeless tobacco: Not on file  Vaping Use   Vaping status: Some Days   Start date: 03/27/2023  Substance and Sexual Activity   Alcohol use: Yes    Comment: occasionaly 1-2 a year   Drug use: Yes    Frequency: 5.0 times per week    Types: Marijuana    Comment: smoke 4-5 times a week.   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 He is a Curator.    Social Drivers of Corporate investment banker Strain: Low Risk  (02/13/2023)   Overall Financial Resource Strain (CARDIA)    Difficulty of Paying Living Expenses: Not hard at all  Food Insecurity: No Food Insecurity (05/10/2023)   Hunger Vital Sign    Worried About Running Out of Food in the Last Year: Never true    Ran Out of Food in the Last Year: Never true  Transportation Needs: No Transportation Needs (05/10/2023)   PRAPARE - Administrator, Civil Service (Medical): No    Lack of Transportation (Non-Medical): No  Physical Activity: Not on file  Stress: No Stress Concern Present (02/13/2023)   [employer] of Occupational Health - Occupational Stress Questionnaire    Feeling of Stress : Not at all  Social Connections: Not on file  Intimate Partner Violence: Not At Risk (05/10/2023)   Humiliation, Afraid, Rape, and Kick questionnaire    Fear of Current or Ex-Partner: No    Emotionally Abused: No    Physically Abused: No    Sexually Abused: No    FAMILY HISTORY: Family History  Problem Relation Age of Onset   Diabetes Mother    Hypertension Mother    Hypertension Father    Stroke Father    Diabetes Father    Cancer Paternal Grandmother        breat cancer    ALLERGIES:  has no known  allergies.  MEDICATIONS:  Current Outpatient Medications  Medication Sig Dispense Refill   acyclovir  (ZOVIRAX ) 400 MG tablet Take 1 tablet (400 mg total) by mouth 2 (two) times daily. (Patient not taking: Reported on 04/11/2024) 60 tablet 1   amoxicillin -clavulanate (AUGMENTIN ) 875-125 MG tablet Take 1 tablet by mouth 2 (two) times daily. (Patient not taking: Reported on 05/06/2024) 14 tablet 0   chlorhexidine  (PERIDEX ) 0.12 % solution Use as directed 15 mLs in the mouth or throat 2 (two) times daily. (Patient not taking: Reported on 04/11/2024) 473 mL  3   ibuprofen  (ADVIL ) 200 MG tablet Take 400 mg by mouth every 6 (six) hours as needed for moderate pain. (Patient not taking: Reported on 04/11/2024)     lidocaine -prilocaine  (EMLA ) cream Apply to affected area once (Patient not taking: Reported on 04/11/2024) 30 g 3   ondansetron  (ZOFRAN ) 8 MG tablet Take 1 tablet (8 mg total) by mouth every 8 (eight) hours as needed for nausea or vomiting. Start on the third day after cyclophosphamide  chemotherapy. (Patient not taking: Reported on 04/11/2024) 30 tablet 1   potassium chloride  (KLOR-CON  M) 10 MEQ tablet Take 1 tablet (10 mEq total) by mouth daily. (Patient not taking: Reported on 05/06/2024) 15 tablet 0   predniSONE  (DELTASONE ) 20 MG tablet Take 5 tablets (100 mg total) by mouth daily. Take on days 2-5 of chemotherapy. (Patient not taking: Reported on 04/11/2024) 20 tablet 7   prochlorperazine  (COMPAZINE ) 10 MG tablet Take 1 tablet (10 mg total) by mouth every 6 (six) hours as needed for nausea or vomiting. (Patient not taking: Reported on 04/11/2024) 30 tablet 6   traMADol  (ULTRAM ) 50 MG tablet Take 1 tablet (50 mg total) by mouth every 6 (six) hours as needed. (Patient not taking: Reported on 04/11/2024) 10 tablet 0   No current facility-administered medications for this visit.    Review of Systems  Constitutional:  Negative for appetite change, chills, fatigue, fever and unexpected weight change.  HENT:    Negative for hearing loss and voice change.   Eyes:  Negative for eye problems and icterus.  Respiratory:  Negative for chest tightness, cough and shortness of breath.   Cardiovascular:  Negative for chest pain and leg swelling.  Gastrointestinal:  Negative for abdominal distention and abdominal pain.  Endocrine: Negative for hot flashes.  Genitourinary:  Negative for difficulty urinating, dysuria and frequency.   Musculoskeletal:  Negative for arthralgias.  Skin:  Negative for itching and rash.  Neurological:  Negative for light-headedness and numbness.  Hematological:  Negative for adenopathy. Does not bruise/bleed easily.  Psychiatric/Behavioral:  Negative for confusion.   CMA Melodie Spry presents for chaperon.  A small vesicle on patient's penis   PHYSICAL EXAMINATION: ECOG PERFORMANCE STATUS: 0 - Asymptomatic  Vitals:   05/06/24 1013  BP: (!) 151/106  Pulse: 86  Resp: 20  Temp: 97.8 F (36.6 C)  SpO2: 100%   Filed Weights   05/06/24 1013  Weight: 293 lb 8 oz (133.1 kg)    Physical Exam Constitutional:      General: He is not in acute distress. HENT:     Head: Normocephalic and atraumatic.  Eyes:     General: No scleral icterus. Cardiovascular:     Rate and Rhythm: Normal rate and regular rhythm.     Heart sounds: No murmur heard. Pulmonary:     Effort: Pulmonary effort is normal. No respiratory distress.  Abdominal:     General: There is no distension.     Palpations: Abdomen is soft.  Genitourinary:    Comments: No palpable inguinal massive lymphadenopathy  Musculoskeletal:        General: Normal range of motion.     Cervical back: Normal range of motion and neck supple.  Skin:    General: Skin is warm and dry.     Findings: No erythema.  Neurological:     Mental Status: He is alert and oriented to person, place, and time. Mental status is at baseline.     Cranial Nerves: No cranial nerve deficit.  Motor: No abnormal muscle tone.  Psychiatric:         Mood and Affect: Mood and affect normal.     RN Carney Christine served as chaperone during GU examination LABORATORY DATA:  I have reviewed the data as listed    Latest Ref Rng & Units 04/11/2024   10:17 AM 06/20/2023   10:22 AM 06/15/2023    8:58 AM  CBC  WBC 4.0 - 10.5 K/uL 6.8  11.3  7.7   Hemoglobin 13.0 - 17.0 g/dL 64.4  03.4  74.2   Hematocrit 39.0 - 52.0 % 47.0  39.1  36.3   Platelets 150 - 400 K/uL 220  418  398       Latest Ref Rng & Units 04/11/2024   10:17 AM 06/20/2023   10:22 AM 06/15/2023    8:58 AM  CMP  Glucose 70 - 99 mg/dL 595  638  756   BUN 6 - 20 mg/dL 15  17  17    Creatinine 0.61 - 1.24 mg/dL 4.33  2.95  1.88   Sodium 135 - 145 mmol/L 138  139  140   Potassium 3.5 - 5.1 mmol/L 3.8  3.0  3.7   Chloride 98 - 111 mmol/L 105  106  108   CO2 22 - 32 mmol/L 25  26  24    Calcium 8.9 - 10.3 mg/dL 9.2  9.1  8.9   Total Protein 6.5 - 8.1 g/dL 7.1  6.7  6.8   Total Bilirubin 0.0 - 1.2 mg/dL 0.8  0.6  0.5   Alkaline Phos 38 - 126 U/L 89  79  92   AST 15 - 41 U/L 46  39  39   ALT 0 - 44 U/L 50  47  54      RADIOGRAPHIC STUDIES: I have personally reviewed the radiological images as listed and agreed with the findings in the report. NM PET Image Restag (PS) Skull Base To Thigh Result Date: 05/01/2024 CLINICAL DATA:  Subsequent treatment strategy for anaplastic large-cell lymphoma. EXAM: NUCLEAR MEDICINE PET SKULL BASE TO THIGH TECHNIQUE: 12.8 mCi F-18 FDG was injected intravenously. Full-ring PET imaging was performed from the skull base to thigh after the radiotracer. CT data was obtained and used for attenuation correction and anatomic localization. Fasting blood glucose: 113 mg/dl COMPARISON:  PET-CT scan 06/04/2023 and older. FINDINGS: Mediastinal blood pool activity: SUV max 2.7 Liver activity: SUV max 3.6 NECK: There is slight thickening and uptake along the nasopharynx posteriorly. Similar to previous. Please correlate with clinical findings and history. Otherwise  no specific abnormal uptake seen in the neck including along lymph node chains of the submandibular, posterior triangle or internal jugular region. Near symmetric uptake of the visualized intracranial compartment. Incidental CT findings: Visualized paranasal sinuses and mastoid air cells are clear. There is some dome lesion identified along the maxilla in the midline. Please correlate clinical findings. Parotid glands, submandibular glands and thyroid glands unremarkable. CHEST: No specific abnormal uptake seen above mediastinal blood pool in the axillary region, hilum or mediastinum. No abnormal lung uptake. Incidental CT findings: There is some linear opacity seen along the dependent portions the lungs likely scar or atelectasis. Stable 4 mm right-sided lung nodule on image 68 without uptake. This could be below threshold again is stable. Right IJ chest port. Tip extends to the central SVC. Coronary artery calcifications are seen. Please correlate for other coronary risk factors. There are minimal. No pericardial effusion. Slightly patulous thoracic esophagus. ABDOMEN/PELVIS: There  is physiologic distribution radiotracer along the parenchymal organs, bowel and renal collecting systems. The previous left inguinal node which had some low-level uptake of 2.4, today has slightly more uptake than the previous examination but the lesion is smaller. Maximum SUV value today of 3.5. Node in this location previously measured 15 mm and today this same lesion has a short axis of 8 mm. Incidental CT findings: Fatty liver infiltration. Gallbladder is present. The spleen is again not show any uptake and is nonenlarged. Maximal dimension of the spleen approaches 10.6 cm. Pancreas, adrenal glands are unremarkable. No abnormal calcification seen within either kidney nor along the course of either ureter. No free air or free fluid. Contracted urinary bladder. Small fat containing left inguinal hernia. Large bowel has a normal course  and caliber with scattered colonic stool. Normal appendix. Small bowel has a normal course and caliber. Slight protuberance of the anterior abdominal wall with rectus muscle diastasis at the level of the umbilicus. Not changed. Diffuse vascular calcifications. SKELETON: Previous uptake diffusely along the has improved. This was likely chemotherapy effect. No areas of aggressive abnormal uptake seen along the visualized osseous structures. Incidental CT findings: None. IMPRESSION: Further reduction in size of the abnormal left inguinal lymph nodes. However uptake is slightly increased from previous, nonspecific. Deauville score 3 No new areas of abnormal uptake elsewhere and abnormal lymph nodes or the spleen. Electronically Signed   By: Adrianna Horde M.D.   On: 05/01/2024 13:21

## 2024-05-06 NOTE — Assessment & Plan Note (Signed)
 Previously he was off acyclovir  400 twice daily. Recommend patient keep regular follow-up appointment with primary care provider for further evaluation.

## 2024-05-07 ENCOUNTER — Other Ambulatory Visit: Payer: Self-pay

## 2024-05-07 ENCOUNTER — Telehealth: Payer: Self-pay | Admitting: Radiation Oncology

## 2024-05-07 NOTE — Telephone Encounter (Signed)
 5/14 @ 10:37 am Left voicemail for patient to call our office to be schedule for consult.

## 2024-05-08 ENCOUNTER — Ambulatory Visit: Admitting: Nurse Practitioner

## 2024-05-08 ENCOUNTER — Telehealth: Payer: Self-pay | Admitting: Radiation Oncology

## 2024-05-08 NOTE — Telephone Encounter (Signed)
 5/15 @ 8:41 am Called left voicemail on both patient and patient's spouse contact numbers for patient to call our office to be schedule for consult.

## 2024-05-12 ENCOUNTER — Ambulatory Visit (INDEPENDENT_AMBULATORY_CARE_PROVIDER_SITE_OTHER): Admitting: Internal Medicine

## 2024-05-12 ENCOUNTER — Encounter: Payer: Self-pay | Admitting: Internal Medicine

## 2024-05-12 VITALS — BP 136/84 | HR 85 | Temp 97.6°F | Ht 73.0 in | Wt 290.6 lb

## 2024-05-12 DIAGNOSIS — A6001 Herpesviral infection of penis: Secondary | ICD-10-CM

## 2024-05-12 DIAGNOSIS — E669 Obesity, unspecified: Secondary | ICD-10-CM

## 2024-05-12 DIAGNOSIS — G4733 Obstructive sleep apnea (adult) (pediatric): Secondary | ICD-10-CM | POA: Diagnosis not present

## 2024-05-12 DIAGNOSIS — R7989 Other specified abnormal findings of blood chemistry: Secondary | ICD-10-CM

## 2024-05-12 DIAGNOSIS — L409 Psoriasis, unspecified: Secondary | ICD-10-CM

## 2024-05-12 DIAGNOSIS — I159 Secondary hypertension, unspecified: Secondary | ICD-10-CM | POA: Diagnosis not present

## 2024-05-12 DIAGNOSIS — C8475 Anaplastic large cell lymphoma, ALK-negative, lymph nodes of inguinal region and lower limb: Secondary | ICD-10-CM

## 2024-05-12 LAB — HEPATIC FUNCTION PANEL
ALT: 38 U/L (ref 0–53)
AST: 37 U/L (ref 0–37)
Albumin: 4.6 g/dL (ref 3.5–5.2)
Alkaline Phosphatase: 93 U/L (ref 39–117)
Bilirubin, Direct: 0.1 mg/dL (ref 0.0–0.3)
Total Bilirubin: 0.4 mg/dL (ref 0.2–1.2)
Total Protein: 7.3 g/dL (ref 6.0–8.3)

## 2024-05-12 LAB — HEMOGLOBIN A1C: Hgb A1c MFr Bld: 6.4 % (ref 4.6–6.5)

## 2024-05-12 MED ORDER — BETAMETHASONE VALERATE 0.1 % EX OINT: 1.0000 | TOPICAL_OINTMENT | Freq: Two times a day (BID) | CUTANEOUS | 0 refills | Status: AC

## 2024-05-12 MED ORDER — VALACYCLOVIR HCL 500 MG PO TABS: 500.0000 mg | ORAL_TABLET | Freq: Every day | ORAL | 1 refills | Status: AC

## 2024-05-12 NOTE — Progress Notes (Signed)
 Home  Acute Office Visit  Subjective:     Patient ID: Randy [name]., male    DOB: Apr 23, 1981, 43 y.o.   MRN: [id number]  Chief Complaint  Patient presents with   Acute Visit    Rash on right shin Itches once a day    HPI Patient is in today for rash over his right lower extremity.  Patient states that he has an intermittent rash over that area for the last 10 years or so but it has been constant over the last couple of months.  He has been using over-the-counter hydrocortisone cream without improvement.  He does state that it is occasionally itchy.  No fevers chills.  No purulent discharge from the rash.  Patient also states that he is concerned for sleep apnea.  He feels fatigue during the day and was told that he snores at night and also occasionally stops breathing at night.  Patient was also noted to have mildly elevated LFTs by his oncologist and needs follow-up for this.  He states that he has been off his antihypertensive medications since last year as his blood pressure has been controlled.  Review of Systems  Constitutional:  Positive for malaise/fatigue.       Patient complains of daytime fatigue, snoring as well as apneic episodes at night  Respiratory: Negative.    Cardiovascular: Negative.   Gastrointestinal: Negative.   Musculoskeletal: Negative.   Skin:  Positive for rash.       Rash over right shin  Neurological: Negative.         Objective:    BP 136/84   Pulse 85   Temp 97.6 F (36.4 C)   Ht 6\' 1"  (1.854 m)   Wt 290 lb 9.6 oz (131.8 kg)   SpO2 95%   BMI 38.34 kg/m    Physical Exam Constitutional:      Appearance: Normal appearance.  HENT:     Head: Normocephalic.  Cardiovascular:     Rate and Rhythm: Normal rate and regular rhythm.     Heart sounds: Normal heart sounds.  Pulmonary:     Effort: Pulmonary effort is normal.     Breath sounds: Normal breath sounds. No wheezing or rales.  Abdominal:     General: Bowel sounds are normal.  There is no distension.     Palpations: Abdomen is soft.     Tenderness: There is no abdominal tenderness.  Musculoskeletal:        General: No swelling or tenderness.     Right lower leg: No edema.     Left lower leg: No edema.  Skin:    Findings: Rash present.     Comments: Patient with an erythematous plaque over his right shin with scaling noted consistent with psoriasis.  Please look at picture in chart  Neurological:     Mental Status: He is alert and oriented to person, place, and time.  Psychiatric:        Mood and Affect: Mood normal.        Behavior: Behavior normal.     No results found for any visits on 05/12/24.      Assessment & Plan:   Problem List Items Addressed This Visit       Cardiovascular and Mediastinum   Hypertension   Relevant Orders   Lipid panel   Hemoglobin A1c     Respiratory   OSA (obstructive sleep apnea) - Primary   Relevant Orders   Ambulatory referral to Pulmonology  Musculoskeletal and Integument   Psoriasis   Relevant Medications   betamethasone  valerate ointment (VALISONE ) 0.1 %     Genitourinary   Genital herpes (Chronic)   Relevant Medications   valACYclovir  (VALTREX ) 500 MG tablet     Other   Elevated LFTs (Chronic)   Relevant Orders   Hepatic function panel   Obesity (BMI 30-39.9)   Relevant Orders   Lipid panel   Hemoglobin A1c    Meds ordered this encounter  Medications   valACYclovir  (VALTREX ) 500 MG tablet    Sig: Take 1 tablet (500 mg total) by mouth daily.    Dispense:  90 tablet    Refill:  1   betamethasone  valerate ointment (VALISONE ) 0.1 %    Sig: Apply 1 Application topically 2 (two) times daily.    Dispense:  30 g    Refill:  0    No follow-ups on file.  Eva Griffo, MD

## 2024-05-12 NOTE — Assessment & Plan Note (Signed)
-   This problem is chronic and stable -Patient follows up with Dr. Wilhelmenia Harada for this and had a referral placed to Dr. Lorri Rota for evaluation for possible RT -He is not on any chemotherapy currently -Patient was noted to have slight increase uptake of left inguinal lymph node on recent PET scan -Patient will follow-up with Dr. Lorri Rota tomorrow for possible RT evaluation -No further workup at this time

## 2024-05-12 NOTE — Assessment & Plan Note (Signed)
-   Patient does have elevated BMI of 38 -Patient was advised diet and exercise today -Will check an A1c and lipid panel as well -No further workup for now

## 2024-05-12 NOTE — Assessment & Plan Note (Addendum)
-   Patient complains of daytime fatigue, snoring at night as well as apneic episodes at night -His STOP-BANG score is elevated at 6 -Patient with likely OSA and needs further evaluation -Ambulatory referral to pulmonology (Dr. Kieran Pellet) for sleep consult was placed -No further workup at this time

## 2024-05-12 NOTE — Assessment & Plan Note (Signed)
-   Patient has had an open rash over his right shin over the last 10 years. -States that he is a persistent rash over the last couple of weeks which is now improved with over-the-counter hydrocortisone cream -On exam, patient has an erythematous plaque over his right shin with kneeling consistent with psoriasis -Will put the patient on betamethasone  ointment for now to see if this will resolve his symptoms -If no improvement we will put in referral to Derm for further evaluation -No further workup at this time

## 2024-05-12 NOTE — Patient Instructions (Signed)
-   It was a pleasure meeting you today -I put in a sleep consult to [facility] pulmonology for you to help arrange sleep study for likely sleep apnea -I have put you on valacyclovir  500 mg daily for genital herpes prophylaxis to help prevent/reduce recurrences -We have also put in for some blood work today including an A1c to assess for diabetes, liver function testing as well as testing for cholesterol levels -I suspect that the rash on your leg is likely psoriasis.  Will put you on a stronger steroid cream to help with this.  If you have a worsening rash or it is not responsive to this medication we will consider putting in a dermatology referral for you -I do not think that we need to put you on a medication for hypertension at this time.  Please continue to work on your diet and exercise to attempt to lose weight. -Please contact us  with any questions or concerns

## 2024-05-12 NOTE — Assessment & Plan Note (Addendum)
-   This problem is chronic and improving -Patient blood pressure today is 136/84 off antihypertensives -Would hold off on receiving an hypertensives at this time -Patient to work on his diet and exercise to see if he can improve his blood pressure further -Will check lipid panel today as well as A1c -He will need follow-up with his PCP for further evaluation -No further workup at this time

## 2024-05-12 NOTE — Progress Notes (Incomplete)
 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 presented as referral from Dr. [name] 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  04/18/2024 Dr. [name] NM PET Image Restag (PS) Skull Base to Thigh CLINICAL DATA:  Subsequent treatment strategy for anaplastic large-cell lymphoma.  IMPRESSION: Further reduction in size of the abnormal left inguinal lymph nodes. However uptake is slightly increased from previous, nonspecific. Deauville score 3.  No new areas of abnormal uptake elsewhere and abnormal lymph nodes or the spleen.  Past/Anticipated interventions by medical oncology, if any:  05/06/2024 Dr. [name]   Weight changes, if any:   Bowel/Bladder complaints, if any:    Nausea/Vomiting, if any:   Pain issues, if any:    SAFETY ISSUES: Prior radiation? No Pacemaker/ICD?  Possible current pregnancy? Male Is the patient on methotrexate? No  Current Complaints / other details:

## 2024-05-12 NOTE — Assessment & Plan Note (Signed)
-   Patient noted to have mild elevated LFTs at oncology office -Will repeat LFTs today -If they remain elevated would consider right upper quadrant ultrasound for further evaluation -No further workup at this time

## 2024-05-12 NOTE — Assessment & Plan Note (Signed)
-   This problem is chronic and stable -Patient states that the number of occurrences have decreased since he has been off chemotherapy for his lymphoma -He was on acyclovir  twice daily for this but has been off his medication for a while -Given that he does have a partner he would benefit from maintenance of an antiviral to prevent recurrent HSV outbreaks -Will start the patient on Valacyclovir  500 mg daily -No further workup at this time

## 2024-05-13 ENCOUNTER — Ambulatory Visit: Payer: Self-pay

## 2024-05-13 ENCOUNTER — Telehealth: Payer: Self-pay

## 2024-05-13 ENCOUNTER — Ambulatory Visit
Admission: RE | Admit: 2024-05-13 | Discharge: 2024-05-13 | Disposition: A | Discharge status: Home / Self Care | Source: Ambulatory Visit | Attending: Radiation Oncology | Admitting: Radiation Oncology

## 2024-05-13 ENCOUNTER — Ambulatory Visit

## 2024-05-13 ENCOUNTER — Encounter: Payer: Self-pay | Admitting: Radiation Oncology

## 2024-05-13 VITALS — BP 161/104 | HR 84 | Temp 97.4°F | Resp 18 | Ht 73.0 in | Wt 292.2 lb

## 2024-05-13 DIAGNOSIS — K760 Fatty (change of) liver, not elsewhere classified: Secondary | ICD-10-CM | POA: Insufficient documentation

## 2024-05-13 DIAGNOSIS — Z803 Family history of malignant neoplasm of breast: Secondary | ICD-10-CM | POA: Insufficient documentation

## 2024-05-13 DIAGNOSIS — N3289 Other specified disorders of bladder: Secondary | ICD-10-CM | POA: Diagnosis not present

## 2024-05-13 DIAGNOSIS — C8475 Anaplastic large cell lymphoma, ALK-negative, lymph nodes of inguinal region and lower limb: Secondary | ICD-10-CM | POA: Diagnosis present

## 2024-05-13 DIAGNOSIS — Z79624 Long term (current) use of inhibitors of nucleotide synthesis: Secondary | ICD-10-CM | POA: Diagnosis not present

## 2024-05-13 DIAGNOSIS — Z87891 Personal history of nicotine dependence: Secondary | ICD-10-CM | POA: Diagnosis not present

## 2024-05-13 DIAGNOSIS — K4090 Unilateral inguinal hernia, without obstruction or gangrene, not specified as recurrent: Secondary | ICD-10-CM | POA: Diagnosis not present

## 2024-05-13 DIAGNOSIS — G40909 Epilepsy, unspecified, not intractable, without status epilepticus: Secondary | ICD-10-CM | POA: Diagnosis not present

## 2024-05-13 DIAGNOSIS — R911 Solitary pulmonary nodule: Secondary | ICD-10-CM | POA: Diagnosis not present

## 2024-05-13 NOTE — Telephone Encounter (Signed)
 Left message to call the office back regarding the lab results below. Okay for E2C2 to give the lab results below.

## 2024-05-13 NOTE — Telephone Encounter (Signed)
 RN called [name] mobile and called wife to find out if he would be making his appointment this morning had to leave voicemail to both phones.  Will inform Dr. Lorri Rota.

## 2024-05-13 NOTE — Progress Notes (Signed)
 Radiation Oncology         [phone] ________________________________  Initial outpatient Consultation  Name: Randy [name]. MRN: [id number]  Date of Service: 05/13/2024 DOB: [birth year]  XL:KGMW, Baker Bon, NP  [name], MD   REFERRING PHYSICIAN: [name], MD  DIAGNOSIS: 42 yo man with Anaplastic ALK-negative large cell lymphoma of lymph node of left inguinal region [facility]). Stage II    ICD-10-CM   1. 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 is a 43 y.o. male seen at the request of Dr. Wilhelmenia Harada. He initially presented in 01/2023 with an enlarging and tender left inguinal mass. He was found to have lymphadenopathy of left inguinal groin, and biopsy of a node on 02/21/23 revealed CD30 positive lymphoma, favor alk-negative anaplastic large cell lymphoma. Work up with excision biopsy of the left inguinal nodal mass, PET scan, and bone marrow biopsy showed no abnormal marrow and lymphoma involvement limited to left inguinal and left external iliac lymph nodes. He was treated with 4 cycles of BV-CHP from 04/12/23 through 06/18/23, and he had a good response on PET scan on 06/04/23. Unfortunately, he was lost to follow up after cycle 4. He returned to Dr. Wilhelmenia Harada in 03/2024 and underwent restaging PET scan on 04/18/24 showing: further reduction in size of abnormal left inguinal lymph nodes, however uptake is slightly increased from previous (nonspecific); no new areas of abnormal uptake elsewhere.  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 ([name])    Migraines    Seizures ([name])    Sepsis ([name]) 05/06/2023      PAST SURGICAL HISTORY: Past Surgical History:  Procedure Laterality Date   INGUINAL LYMPH NODE BIOPSY Left 03/12/2023   Procedure: INGUINAL LYMPH NODE BIOPSY  excisional;  Surgeon: Eldred Grego, MD;  Location: [facility] ORS;  Service: General;  Laterality: Left;   IR BONE MARROW BIOPSY & ASPIRATION  03/21/2023   PORTACATH PLACEMENT N/A 04/11/2023   Procedure: INSERTION PORT-A-CATH;  Surgeon: Eldred Grego, MD;  Location: [facility] ORS;  Service: General;  Laterality: N/A;    FAMILY HISTORY:  Family History  Problem Relation Age of Onset   Diabetes Mother    Hypertension Mother    Hypertension Father    Stroke Father    Diabetes Father    Cancer Paternal Grandmother        breat cancer    SOCIAL HISTORY:  Social History   Socioeconomic History   Marital status: Married    Spouse name: Not on file   Number of children: Not on file   Years of education: Not on file   Highest education level: Not on file  Occupational History   Not on file  Tobacco Use   Smoking status: Former    Current packs/day: 0.00    Types: Cigarettes    Start date: 03/26/2007    Quit date: 03/25/2022    Years since quitting: 2.1   Smokeless tobacco: Not on file  Vaping Use   Vaping status: Some Days   Start date: 03/27/2023  Substance and Sexual Activity   Alcohol use: Yes    Comment: occasionaly 1-2 a year   Drug use: Yes    Frequency: 5.0 times per week    Types: Marijuana    Comment: smoke 4-5 times  a week.   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 He is a Curator.    Social Drivers of Corporate investment banker Strain: Low Risk  (02/13/2023)   Overall Financial Resource Strain (CARDIA)    Difficulty of Paying Living Expenses: Not hard at all  Food Insecurity: No Food Insecurity (05/10/2023)   Hunger Vital Sign    Worried About Running Out of Food in the Last Year: Never true    Ran Out of Food in the Last Year: Never true  Transportation Needs: No Transportation Needs (05/10/2023)   PRAPARE - Administrator, Civil Service  (Medical): No    Lack of Transportation (Non-Medical): No  Physical Activity: Not on file  Stress: No Stress Concern Present (02/13/2023)   [employer] of Occupational Health - Occupational Stress Questionnaire    Feeling of Stress : Not at all  Social Connections: Not on file  Intimate Partner Violence: Not At Risk (05/10/2023)   Humiliation, Afraid, Rape, and Kick questionnaire    Fear of Current or Ex-Partner: No    Emotionally Abused: No    Physically Abused: No    Sexually Abused: No    ALLERGIES: Patient has no known allergies.  MEDICATIONS:  Current Outpatient Medications  Medication Sig Dispense Refill   betamethasone  valerate ointment (VALISONE ) 0.1 % Apply 1 Application topically 2 (two) times daily. 30 g 0   valACYclovir  (VALTREX ) 500 MG tablet Take 1 tablet (500 mg total) by mouth daily. 90 tablet 1   No current facility-administered medications for this encounter.    REVIEW OF SYSTEMS:  On review of systems, the patient reports that he is doing well overall. He denies any chest pain, shortness of breath, cough, fevers, chills, night sweats, unintended weight changes. He denies any bowel or bladder disturbances, and denies abdominal pain, nausea or vomiting. He denies any new musculoskeletal or joint aches or pains.  A complete review of systems is obtained and is otherwise negative.    PHYSICAL EXAM:  Wt Readings from Last 3 Encounters:  05/13/24 292 lb 3.2 oz (132.5 kg)  05/12/24 290 lb 9.6 oz (131.8 kg)  05/06/24 293 lb 8 oz (133.1 kg)   Temp Readings from Last 3 Encounters:  05/13/24 (!) 97.4 F (36.3 C)  05/12/24 97.6 F (36.4 C)  05/06/24 97.8 F (36.6 C)   BP Readings from Last 3 Encounters:  05/13/24 (!) 161/104  05/12/24 136/84  05/06/24 (!) 151/106   Pulse Readings from Last 3 Encounters:  05/13/24 84  05/12/24 85  05/06/24 86    /10  In general this is a well appearing man in no acute distress. He's alert and oriented x4 and  appropriate throughout the examination. Cardiopulmonary assessment is negative for acute distress and he exhibits normal effort.     KPS = 100  100 - Normal; no complaints; no evidence of disease. 90   - Able to carry on normal activity; minor signs or symptoms of disease. 80   - Normal activity with effort; some signs or symptoms of disease. 34   - Cares for self; unable to carry on normal activity or to do active work. 60   - Requires occasional assistance, but is able to care for most of his personal needs. 50   - Requires considerable assistance and frequent medical care. 40   - Disabled;  requires special care and assistance. 30   - Severely disabled; hospital admission is indicated although death not imminent. 20   - Very sick; hospital admission necessary; active supportive treatment necessary. 10   - Moribund; fatal processes progressing rapidly. 0     - Dead  Karnofsky DA, Abelmann WH, Craver LS and Burchenal [facility] [phone]) The use of the nitrogen mustards in the palliative treatment of carcinoma: with particular reference to bronchogenic carcinoma Cancer 1 634-56  LABORATORY DATA:  Lab Results  Component Value Date   WBC 6.8 04/11/2024   HGB 15.6 04/11/2024   HCT 47.0 04/11/2024   MCV 86.9 04/11/2024   PLT 220 04/11/2024   Lab Results  Component Value Date   NA 138 04/11/2024   K 3.8 04/11/2024   CL 105 04/11/2024   CO2 25 04/11/2024   Lab Results  Component Value Date   ALT 38 05/12/2024   AST 37 05/12/2024   ALKPHOS 93 05/12/2024   BILITOT 0.4 05/12/2024     RADIOGRAPHY: NM PET Image Restag (PS) Skull Base To Thigh Result Date: 05/01/2024 CLINICAL DATA:  Subsequent treatment strategy for anaplastic large-cell lymphoma. EXAM: NUCLEAR MEDICINE PET SKULL BASE TO THIGH TECHNIQUE: 12.8 mCi F-18 FDG was injected intravenously. Full-ring PET imaging was performed from the skull base to thigh after the radiotracer. CT data was obtained and used for attenuation correction and  anatomic localization. Fasting blood glucose: 113 mg/dl COMPARISON:  PET-CT scan 06/04/2023 and older. FINDINGS: Mediastinal blood pool activity: SUV max 2.7 Liver activity: SUV max 3.6 NECK: There is slight thickening and uptake along the nasopharynx posteriorly. Similar to previous. Please correlate with clinical findings and history. Otherwise no specific abnormal uptake seen in the neck including along lymph node chains of the submandibular, posterior triangle or internal jugular region. Near symmetric uptake of the visualized intracranial compartment. Incidental CT findings: Visualized paranasal sinuses and mastoid air cells are clear. There is some dome lesion identified along the maxilla in the midline. Please correlate clinical findings. Parotid glands, submandibular glands and thyroid glands unremarkable. CHEST: No specific abnormal uptake seen above mediastinal blood pool in the axillary region, hilum or mediastinum. No abnormal lung uptake. Incidental CT findings: There is some linear opacity seen along the dependent portions the lungs likely scar or atelectasis. Stable 4 mm right-sided lung nodule on image 68 without uptake. This could be below threshold again is stable. Right IJ chest port. Tip extends to the central SVC. Coronary artery calcifications are seen. Please correlate for other coronary risk factors. There are minimal. No pericardial effusion. Slightly patulous thoracic esophagus. ABDOMEN/PELVIS: There is physiologic distribution radiotracer along the parenchymal organs, bowel and renal collecting systems. The previous left inguinal node which had some low-level uptake of 2.4, today has slightly more uptake than the previous examination but the lesion is smaller. Maximum SUV value today of 3.5. Node in this location previously measured 15 mm and today this same lesion has a short axis of 8 mm. Incidental CT findings: Fatty liver infiltration. Gallbladder is present. The spleen is again not  show any uptake and is nonenlarged. Maximal dimension of the spleen approaches 10.6 cm. Pancreas, adrenal glands are unremarkable. No abnormal calcification seen within either kidney nor along the course of either ureter. No free air or free fluid. Contracted urinary bladder. Small fat containing left inguinal hernia. Large bowel has a normal course and caliber with scattered colonic stool. Normal appendix. Small bowel has a normal course and caliber. Slight protuberance of  the anterior abdominal wall with rectus muscle diastasis at the level of the umbilicus. Not changed. Diffuse vascular calcifications. SKELETON: Previous uptake diffusely along the has improved. This was likely chemotherapy effect. No areas of aggressive abnormal uptake seen along the visualized osseous structures. Incidental CT findings: None. IMPRESSION: Further reduction in size of the abnormal left inguinal lymph nodes. However uptake is slightly increased from previous, nonspecific. Deauville score 3 No new areas of abnormal uptake elsewhere and abnormal lymph nodes or the spleen. Electronically Signed   By: Adrianna Horde M.D.   On: 05/01/2024 13:21      IMPRESSION/PLAN: 1. 43 yo man with Anaplastic ALK-negative large cell lymphoma of lymph node of left inguinal region [facility]). Stage II. He received only 4 out 6 planned cycles of BV+CHP with complete response on PET after 3 cycles.  The patient elected to return to work and withdrew from treatment and follow-up against medical advice.  However, he returned for follow-up after a 9-10 month hiatus and repeat PET shows the treated left inguinal node is smaller but, showing some activity.  Fortunately, there is no active disease elsewhere.  The patient may benefit from involved site radiotherapy at this time to optimize local control with curative intent which may reduce his need for salvage chemotherapy in the future.  However, he also may be a candidate for observation as well.  This is an  uncommon enough disease and scenario, that there are no clear recommendations.  I would assume his need for salvage chemo in the next 10 years will be reduced with ISRT, but, the magnitude of benefit is difficult to quantify.  Likelihood of recurrence may be higher than 50% without ISRT and may still be as high as 30% with ISRT.  I would certainly be willing to treat him if he is interested.  Today, we talked to the patient and family about the findings and workup thus far. We discussed the natural history of lymphoma and general treatment, highlighting the role of radiotherapy in the management. We discussed the available radiation techniques, and focused on the details and logistics of delivery. We reviewed the anticipated acute and late sequelae associated with radiation in this setting. The patient was encouraged to ask questions that were answered to his satisfaction.  At the end of our discussion, the patient would like to discuss with his wife.  He has my phone and email for follow-up questions.  I personally spent 60 minutes in this encounter including chart review, reviewing radiological studies, meeting face-to-face with the patient, entering orders and completing documentation.      Kenith Payer, MD  [facility]  Radiation Oncology Direct Dial: [phone]  Fax: [phone] [email or web address]  Skype  [email or web address]   This document serves as a record of services personally performed by Kenith Payer, MD. It was created on their behalf by Florance Hun, a trained medical scribe. The creation of this record is based on the scribe's personal observations and the provider's statements to them. This document has been checked and approved by the attending provider.

## 2024-05-13 NOTE — Telephone Encounter (Signed)
-----   Message from Jeffie Mingle sent at 05/13/2024 11:18 AM EDT ----- Hello all,  Can we let this patient know his A1C is right at the borderline between prediabetes and diabetes? He needs to follow a diabetic diet and exercise to lose weight. He may also benefit form a statin (need to follow up lipid panel) and maybe starting a diabetic medication if his A1C does not improve.  His liver function tests are normal.  Thank you, Dr. Sharia Daunt ----- Message ----- From: Interface, Lab In Three Zero One Sent: 05/12/2024   2:19 PM EDT To: Jeffie Mingle, MD

## 2024-05-13 NOTE — Progress Notes (Addendum)
 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 presented as referral from Dr. 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  04/18/2024 Dr. [name] NM PET Image Restag (PS) Skull Base to Thigh CLINICAL DATA:  Subsequent treatment strategy for anaplastic large-cell lymphoma.  IMPRESSION: Further reduction in size of the abnormal left inguinal lymph nodes. However uptake is slightly increased from previous, nonspecific. Deauville score 3.  No new areas of abnormal uptake elsewhere and abnormal lymph nodes or the spleen.  Past/Anticipated interventions by medical oncology, if any:  05/06/2024 Dr. [name]   Weight changes, if any: No  Bowel/Bladder complaints, if any:  No  Nausea/Vomiting, if any: No  Pain issues, if any:  0/10  SAFETY ISSUES: Prior radiation? No Pacemaker/ICD? No Possible current pregnancy? Male Is the patient on methotrexate? No  Current Complaints / other details:

## 2024-05-20 ENCOUNTER — Other Ambulatory Visit: Payer: Self-pay

## 2024-05-26 ENCOUNTER — Ambulatory Visit (INDEPENDENT_AMBULATORY_CARE_PROVIDER_SITE_OTHER): Admitting: Sleep Medicine

## 2024-05-26 ENCOUNTER — Encounter: Payer: Self-pay | Admitting: Sleep Medicine

## 2024-05-26 VITALS — BP 120/90 | HR 80 | Temp 97.6°F | Ht 73.0 in | Wt 290.8 lb

## 2024-05-26 DIAGNOSIS — G4733 Obstructive sleep apnea (adult) (pediatric): Secondary | ICD-10-CM | POA: Diagnosis not present

## 2024-05-26 DIAGNOSIS — E66812 Obesity, class 2: Secondary | ICD-10-CM | POA: Diagnosis not present

## 2024-05-26 DIAGNOSIS — Z87891 Personal history of nicotine dependence: Secondary | ICD-10-CM

## 2024-05-26 DIAGNOSIS — Z6838 Body mass index (BMI) 38.0-38.9, adult: Secondary | ICD-10-CM | POA: Diagnosis not present

## 2024-05-26 NOTE — Progress Notes (Signed)
 Name:Randy Kosier Jr. MRN: [id number] DOB: 15-Jul-1981   CHIEF COMPLAINT:  EXCESSIVE DAYTIME SLEEPINESS   HISTORY OF PRESENT ILLNESS:  Randy [name] is a 43 y.o. w/ a h/o lymphoma s/p chemo and obesity who presents for c/o loud snoring, witnessed apnea and excessive daytime sleepiness which has been present for several years. Reports occasional nocturnal awakenings due to unclear reasons, however does not have difficulty falling back to sleep. Reports a 40-50 lb over the last few years. Admits to dry mouth. Denies morning headaches, RLS symptoms, dream enactment, cataplexy, hypnagogic or hypnapompic hallucinations. Reports a family history of sleep apnea. Denies drowsy driving. Drinks 1-2 cup of coffee daily, denies alcohol or tobacco use. Uses marijuana daily.   Bedtime 12-1 am Sleep onset 5 mins Rise time 6 am   EPWORTH SLEEP SCORE 22    05/26/2024   10:00 AM  Results of the Epworth flowsheet  Sitting and reading 3  Watching TV 3  Sitting, inactive in a public place (e.g. 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  has a past surgical history that includes Inguinal lymph node biopsy (Left, 03/12/2023); IR BONE MARROW BIOPSY & ASPIRATION (03/21/2023); and Portacath placement (N/A, 04/11/2023). Prior to Admission medications   Medication Sig Start Date End Date Taking? Authorizing Provider  betamethasone  valerate ointment (VALISONE ) 0.1 % Apply 1 Application topically 2 (two) times daily. 05/12/24  Yes Narendra, Nischal, MD   valACYclovir  (VALTREX ) 500 MG tablet Take 1 tablet (500 mg total) by mouth daily. 05/12/24  Yes Jeffie Mingle, MD   No Known Allergies  FAMILY HISTORY:  family history includes Cancer in his paternal grandmother; Diabetes in his father and mother; Hypertension in his father and mother; Stroke in his father. SOCIAL HISTORY:  reports that he quit smoking about 2 years ago. His smoking use included cigarettes. He started smoking about 17 years ago. He does not have any smokeless tobacco history on file. He reports current alcohol use. He reports current drug use. Frequency: 5.00 times per week. Drug: Marijuana.   Review of Systems:  Gen:  Denies  fever, sweats, chills weight loss  HEENT: Denies blurred vision, double vision, ear pain, eye pain, hearing loss, nose bleeds, sore throat Cardiac:  No dizziness, chest pain or heaviness, chest tightness,edema, No JVD Resp:   No cough, -sputum production, -shortness of breath,-wheezing, -hemoptysis,  Gi: Denies swallowing difficulty, stomach pain, nausea or vomiting, diarrhea, constipation, bowel incontinence Gu:  Denies bladder incontinence, burning urine Ext:   Denies Joint pain, stiffness or swelling Skin: Denies  skin rash, easy bruising or bleeding or hives Endoc:  Denies polyuria, polydipsia , polyphagia or weight change Psych:   Denies depression, insomnia or hallucinations  Other:  All other systems negative  VITAL SIGNS: BP (!) 120/90 (BP Location: Right Arm, Patient Position: Sitting, Cuff Size: Large)   Pulse 80   Temp 97.6 F (36.4 C) (Oral)   Ht 6\' 1"  (1.854 m)  Wt 290 lb 12.8 oz (131.9 kg)   SpO2 98%   BMI 38.37 kg/m    Physical Examination:   General Appearance: No distress  EYES PERRLA, EOM intact.   NECK Supple, No JVD Pulmonary: normal breath sounds, No wheezing.  CardiovascularNormal S1,S2.  No m/r/g.   Abdomen: Benign, Soft, non-tender. Skin:   warm, no rashes, no ecchymosis  Extremities: normal, no  cyanosis, clubbing. Neuro:without focal findings,  speech normal  PSYCHIATRIC: Mood, affect within normal limits.   ASSESSMENT AND PLAN  OSA I suspect that OSA is likely present due to clinical presentation. Discussed the consequences of untreated sleep apnea. Advised not to drive drowsy for safety of patient and others. Will complete further evaluation with a home sleep study and follow up to review results.    Obesity Counseled patient on diet and lifestyle modification.    MEDICATION ADJUSTMENTS/LABS AND TESTS ORDERED: Recommend Sleep Study   Patient  satisfied with Plan of action and management. All questions answered  Follow up to review HST results and treatment plan.   I spent a total of 36 minutes reviewing chart data, face-to-face evaluation with the patient, counseling and coordination of care as detailed above.    Latoi Giraldo, M.D.  Sleep Medicine [facility]

## 2024-05-26 NOTE — Patient Instructions (Signed)
 Randy [name]

## 2024-06-03 ENCOUNTER — Telehealth: Payer: Self-pay

## 2024-06-03 NOTE — Telephone Encounter (Signed)
 Last port flush was on 04/10/24. Please schedule portflushes every 6- 8 weeks until next visit. Please notify pt of appts

## 2024-06-06 ENCOUNTER — Inpatient Hospital Stay: Attending: Oncology

## 2024-07-05 ENCOUNTER — Encounter

## 2024-07-05 DIAGNOSIS — G4733 Obstructive sleep apnea (adult) (pediatric): Secondary | ICD-10-CM

## 2024-07-17 DIAGNOSIS — R069 Unspecified abnormalities of breathing: Secondary | ICD-10-CM | POA: Diagnosis not present

## 2024-07-18 ENCOUNTER — Inpatient Hospital Stay: Attending: Oncology

## 2024-07-18 ENCOUNTER — Ambulatory Visit: Payer: Self-pay

## 2024-07-18 DIAGNOSIS — Z95828 Presence of other vascular implants and grafts: Secondary | ICD-10-CM

## 2024-07-18 DIAGNOSIS — Z452 Encounter for adjustment and management of vascular access device: Secondary | ICD-10-CM | POA: Diagnosis present

## 2024-07-18 DIAGNOSIS — G4733 Obstructive sleep apnea (adult) (pediatric): Secondary | ICD-10-CM

## 2024-07-18 DIAGNOSIS — C8475 Anaplastic large cell lymphoma, ALK-negative, lymph nodes of inguinal region and lower limb: Secondary | ICD-10-CM | POA: Insufficient documentation

## 2024-07-18 MED ORDER — HEPARIN SOD (PORK) LOCK FLUSH 100 UNIT/ML IV SOLN
500.0000 [IU] | Freq: Once | INTRAVENOUS | Status: AC
  Administered 2024-07-18: 500 [IU] via INTRAVENOUS
  Filled 2024-07-18: qty 5

## 2024-07-18 MED ORDER — SODIUM CHLORIDE 0.9% FLUSH
10.0000 mL | Freq: Once | INTRAVENOUS | Status: AC
  Administered 2024-07-18: 10 mL via INTRAVENOUS
  Filled 2024-07-18: qty 10

## 2024-07-22 NOTE — Telephone Encounter (Signed)
-----   Message from [facility] D REDDY sent at 07/18/2024  9:10 AM EDT ----- Please notify patient that HST revealed severe OSA, recommend proceeding with APAP therapy set to 4-16 cm H2O, EPR 3 with the Airtouch N30i nasal mask. Please also schedule a 3 month follow up visit  if patient wishes to proceed with CPAP therapy. Thanks   ----- Message ----- From: Vannie Donzell RAMAN Sent: 07/18/2024   8:10 AM EDT To: Pallavi D Reddy, MD

## 2024-07-22 NOTE — Telephone Encounter (Signed)
 LMTCB. E2C2 please advise when patient calls back.

## 2024-07-23 NOTE — Telephone Encounter (Signed)
 I have notified the patient and placed the CPAP order. The patient will call back once he receives the machine to schedule a 31-90 follow up appt.  Nothing further needed.

## 2024-07-28 ENCOUNTER — Telehealth: Payer: Self-pay | Admitting: Sleep Medicine

## 2024-07-28 NOTE — Telephone Encounter (Signed)
 LVMTCB to schedule CPAP compliance between 09/01/2024 to 10/31/2024.

## 2024-08-07 NOTE — Telephone Encounter (Signed)
 LVMTCB to schedule CPAP compliance between 09/01/2024 to 10/31/2024.

## 2024-08-29 ENCOUNTER — Inpatient Hospital Stay: Attending: Oncology

## 2024-10-10 ENCOUNTER — Inpatient Hospital Stay

## 2024-10-13 ENCOUNTER — Inpatient Hospital Stay: Attending: Oncology

## 2024-10-13 DIAGNOSIS — C8475 Anaplastic large cell lymphoma, ALK-negative, lymph nodes of inguinal region and lower limb: Secondary | ICD-10-CM | POA: Insufficient documentation

## 2024-10-13 DIAGNOSIS — Z452 Encounter for adjustment and management of vascular access device: Secondary | ICD-10-CM | POA: Insufficient documentation

## 2024-11-07 ENCOUNTER — Ambulatory Visit
Admission: RE | Admit: 2024-11-07 | Discharge: 2024-11-07 | Disposition: A | Discharge status: Home / Self Care | Source: Ambulatory Visit | Attending: Oncology | Admitting: Oncology

## 2024-11-07 DIAGNOSIS — C8475 Anaplastic large cell lymphoma, ALK-negative, lymph nodes of inguinal region and lower limb: Secondary | ICD-10-CM

## 2024-11-07 DIAGNOSIS — C858A Other specified types of non-Hodgkin lymphoma, in remission: Secondary | ICD-10-CM | POA: Insufficient documentation

## 2024-11-07 LAB — GLUCOSE, CAPILLARY: Glucose-Capillary: 96 mg/dL (ref 70–99)

## 2024-11-07 MED ORDER — FLUDEOXYGLUCOSE F - 18 (FDG) INJECTION
11.9800 | Freq: Once | INTRAVENOUS | Status: AC | PRN
  Administered 2024-11-07: 11.98 via INTRAVENOUS

## 2024-11-19 ENCOUNTER — Inpatient Hospital Stay

## 2024-11-19 ENCOUNTER — Inpatient Hospital Stay: Admitting: Oncology

## 2024-11-19 ENCOUNTER — Telehealth: Payer: Self-pay | Admitting: Oncology

## 2024-11-19 NOTE — Telephone Encounter (Signed)
 We had a cancellation on 12/29. I called and left vm for pt that there was a cancellation and I have moved appt to this new date. Scheduling phone number provided if 12/29 does not work for pt.

## 2024-11-19 NOTE — Telephone Encounter (Signed)
 Patient called today to reschedule his appointment. He said he tried to call all day yesterday and couldn't get anyone. I rescheduled to 1/8 (next available) He asked if you could add him to the wait list? He was to get PET results today.   If something comes open, he can be reached at [phone]

## 2024-11-20 ENCOUNTER — Other Ambulatory Visit: Payer: Self-pay

## 2024-12-22 ENCOUNTER — Inpatient Hospital Stay: Attending: Oncology

## 2024-12-22 ENCOUNTER — Inpatient Hospital Stay (HOSPITAL_BASED_OUTPATIENT_CLINIC_OR_DEPARTMENT_OTHER): Admitting: Oncology

## 2024-12-22 ENCOUNTER — Encounter: Payer: Self-pay | Admitting: Oncology

## 2024-12-22 ENCOUNTER — Other Ambulatory Visit: Payer: Self-pay

## 2024-12-22 VITALS — BP 149/89 | HR 88 | Temp 98.1°F | Resp 18 | Wt 296.0 lb

## 2024-12-22 DIAGNOSIS — R7989 Other specified abnormal findings of blood chemistry: Secondary | ICD-10-CM | POA: Diagnosis not present

## 2024-12-22 DIAGNOSIS — C8475 Anaplastic large cell lymphoma, ALK-negative, lymph nodes of inguinal region and lower limb: Secondary | ICD-10-CM | POA: Insufficient documentation

## 2024-12-22 DIAGNOSIS — A6001 Herpesviral infection of penis: Secondary | ICD-10-CM

## 2024-12-22 LAB — CBC WITH DIFFERENTIAL (CANCER CENTER ONLY)
Abs Immature Granulocytes: 0.02 K/uL (ref 0.00–0.07)
Basophils Absolute: 0.1 K/uL (ref 0.0–0.1)
Basophils Relative: 1 %
Eosinophils Absolute: 0.3 K/uL (ref 0.0–0.5)
Eosinophils Relative: 4 %
HCT: 43.9 % (ref 39.0–52.0)
Hemoglobin: 14.7 g/dL (ref 13.0–17.0)
Immature Granulocytes: 0 %
Lymphocytes Relative: 33 %
Lymphs Abs: 2.3 K/uL (ref 0.7–4.0)
MCH: 28.8 pg (ref 26.0–34.0)
MCHC: 33.5 g/dL (ref 30.0–36.0)
MCV: 86.1 fL (ref 80.0–100.0)
Monocytes Absolute: 0.6 K/uL (ref 0.1–1.0)
Monocytes Relative: 8 %
Neutro Abs: 3.9 K/uL (ref 1.7–7.7)
Neutrophils Relative %: 54 %
Platelet Count: 292 K/uL (ref 150–400)
RBC: 5.1 MIL/uL (ref 4.22–5.81)
RDW: 14.6 % (ref 11.5–15.5)
WBC Count: 7 K/uL (ref 4.0–10.5)
nRBC: 0 % (ref 0.0–0.2)

## 2024-12-22 LAB — CMP (CANCER CENTER ONLY)
ALT: 56 U/L — ABNORMAL HIGH (ref 0–44)
AST: 50 U/L — ABNORMAL HIGH (ref 15–41)
Albumin: 4.4 g/dL (ref 3.5–5.0)
Alkaline Phosphatase: 108 U/L (ref 38–126)
Anion gap: 10 (ref 5–15)
BUN: 13 mg/dL (ref 6–20)
CO2: 26 mmol/L (ref 22–32)
Calcium: 9.4 mg/dL (ref 8.9–10.3)
Chloride: 106 mmol/L (ref 98–111)
Creatinine: 1.07 mg/dL (ref 0.61–1.24)
GFR, Estimated: >60 mL/min
Glucose, Bld: 110 mg/dL — ABNORMAL HIGH (ref 70–99)
Potassium: 3.9 mmol/L (ref 3.5–5.1)
Sodium: 142 mmol/L (ref 135–145)
Total Bilirubin: 0.4 mg/dL (ref 0.0–1.2)
Total Protein: 7.1 g/dL (ref 6.5–8.1)

## 2024-12-22 LAB — LACTATE DEHYDROGENASE: LDH: 255 U/L — ABNORMAL HIGH (ref 105–235)

## 2024-12-22 NOTE — Assessment & Plan Note (Signed)
 Previously he was off acyclovir  400 twice daily. Recommend patient keep regular follow-up appointment with primary care provider for further evaluation.

## 2024-12-22 NOTE — Progress Notes (Signed)
 " Hematology/Oncology Progress note [phone] [phone]      CHIEF COMPLAINTS/PURPOSE OF CONSULTATION:  ALK negative, anaplastic large cell lymphoma.   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  Total inpatient had 4 cycles of BV-CHP with D3 GCSF 05/01/2024 PET3 showed CR, Deuville 2  Patient lost follow-up after 4 cycles chemotherapy treatment. 04/18/2024 PET showed CR, slight increase of uptake of left inguinal LN. Deuville 3 Patient was evaluated by radiation oncology.  Patient elects not to proceed with radiation. 11/07/2024, PET scan showed CR.  Decreased activity of the left inguinal lymph node.  Discussed with patient that he has been in complete remission.  Clinically doing very well.  In the future, images for PE obtained as clinically indicated. LDH is chronically elevated.  Unknown significance.   Elevated LFTs Stable.  Monitor. Likely due to fatty liver disease.  Discussed with patient about health diet and exercise.   Genital herpes Previously he was off acyclovir  400 twice daily. Recommend patient keep regular follow-up appointment with primary care provider for further evaluation.     Orders Placed This Encounter  Procedures   CBC with Differential (Cancer Center Only)    Standing Status:   Future    Expected Date:   06/22/2025    Expiration Date:   09/20/2025   CMP (Cancer Center only)    Standing Status:   Future    Expected Date:   06/22/2025    Expiration Date:   09/20/2025   Lactate  dehydrogenase    Standing Status:   Future    Expected Date:   06/22/2025    Expiration Date:   09/20/2025    Follow up  6 months  All questions were answered. The patient knows to call the clinic with any problems, questions or concerns.  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 02/09/2023 CT abdomen pelvis with contrast showed massive lymphadenopathy of left inguinal groin, largest measures 6.1 x 5.5 x 4.4 cm  Smalt fat containing left inguinal hernia.  5 mm left solid pulmonary nodule.  Urine was negative for chlamydia and gonorrhea  patient was given empiric antibiotics and referred to establish care with oncology for further evaluation.   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  Excisional biopsy was recommended.    Cytogenetic FISH studies: DUSP22 rearrangement positive, TP63 rearrangement negative.   03/12/2023, left inguinal excisional lymph node biopsy showed CD30 positive T-cell lymphoma, most consistent with ALK negative anaplastic large cell lymphoma.    03/21/2023, bone marrow biopsy showed normocellular bone marrow for age with trilineage hematopoiesis.  No significant CD34 positive blastic population  identified.  No monoclonal B-cell population or significant T-cell abnormalities identified     03/14/2023 Imaging   PET scan showed  1. Hypermetabolic left inguinal and left external iliac lymph nodes consistent with lymphomatous disease involvement. 2.  Diffuse low-level FDG avidity throughout the axial and appendicular skeleton is nonspecific likely physiologic less likely reflecting lymphomatous involvement. 3. No splenomegaly. 4. Symmetric hypermetabolic hyperplasia of the tonsils is commonly reactive. 5. Tiny fat containing left inguinal hernia. 6. Aortic Atherosclerosis    03/22/2023 Cancer Staging   Staging form: Hodgkin and Non-Hodgkin Lymphoma, AJCC 8th Edition - Clinical stage from 03/22/2023: Stage II (Peripheral T-cell lymphoma) - Signed by Babara Call, MD on 04/05/2023 Stage prefix: Initial diagnosis   04/03/2023 Echocardiogram   LVEF 60 to 65%    04/12/2023 -  Chemotherapy   Patient is on Treatment Plan : LYMPHOMA  A + CHP (Brentuximab + Cyclophosphamide  + Doxorubicin  + Prednisone ) q21 Days x 6 Cycles     04/12/2023 Procedure   Medi port placed by Dr. Cesar   05/05/2023 - 05/10/2023 Hospital Admission   Patient was hospitalized due to sepsis, dental caries with dental infection He was treated with vancomycin , Rocephin  and Flagyl  transition to cefdinir  Bactrim  discharged home 05/09/2023 He was readmitted the same day due to vasovagal syncope.  He was also found to have elevated LFTs was found to be likely secondary to Bactrim .  Switched to Augmentin  at discharge.   06/04/2023 Imaging   PET showed  1. Marked reduction in size and metabolic activity of LEFT inguinal lymph nodes. Deauville 2 2. No evidence of new or progressive lymphoma. 3. Normal spleen. 4. Uniform increased bone marrow activity most likely a GCSF type marrow response   04/18/2024 Imaging   PET scan  Further reduction in size of the abnormal left inguinal lymph nodes. However uptake is slightly increased from previous, nonspecific. Deauville score 3   No new areas of abnormal uptake elsewhere and abnormal lymph nodes or the spleen.   11/07/2024 Imaging   PET scan showed 1. No evidence of metabolically active lymphoma ( Deauville 1 ) . 2. Small left  inguinal lymph node decreased to 6 mm with reduced uptake (SUV max 2.0), negative for active disease.    Reviewed previous medical history. 01/08/2014-01/08/2021, patient presented emergency room for headache,.  LP was done which showed lymphocytic predominant pleocytosis, HSV 2+, RPR reactive 1:4, HIV and crypto Ag negative.  He was treated with HSV meningitis with acyclovir .  Patient also was treated for skin eruptions secondary syphilis with penicillin  injections.   INTERVAL HISTORY Randy [name]. is a 43 y.o. male who has above history reviewed by me today presents for follow up visit for Anaplastic ALK-negative large cell lymphoma   Patient denies nausea vomiting diarrhea, unintentional weight loss, night sweats, fever. He has noticed a very small vesicle on his penis.  History of genital herpes and was previously on acyclovir .  Recently he ran out of medication has been off.  No fever or chills.  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 ([name])    Migraines    Seizures ([name])    Sepsis ([name]) 05/06/2023    SURGICAL HISTORY: Past Surgical History:  Procedure Laterality Date   INGUINAL LYMPH NODE BIOPSY Left 03/12/2023   Procedure: INGUINAL LYMPH NODE BIOPSY excisional;  Surgeon: Rodolph Romano, MD;  Location: [facility] ORS;  Service: General;  Laterality: Left;  IR BONE MARROW BIOPSY & ASPIRATION  03/21/2023   PORTACATH PLACEMENT N/A 04/11/2023   Procedure: INSERTION PORT-A-CATH;  Surgeon: Rodolph Romano, MD;  Location: [facility] ORS;  Service: General;  Laterality: N/A;    SOCIAL HISTORY: Social History   Socioeconomic History   Marital status: Married    Spouse name: Not on file   Number of children: Not on file   Years of education: Not on file   Highest education level: Not on file  Occupational History   Not on file  Tobacco Use   Smoking status: Former    Current  packs/day: 0.00    Types: Cigarettes    Start date: 03/26/2007    Quit date: 03/25/2022    Years since quitting: 2.7   Smokeless tobacco: Not on file  Vaping Use   Vaping status: Some Days   Start date: 03/27/2023   Substances: Nicotine  Substance and Sexual Activity   Alcohol use: Yes    Comment: occasionaly 1-2 a year   Drug use: Yes    Frequency: 5.0 times per week    Types: Marijuana    Comment: smoke 4-5 times a week.   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 He is a curator.    Social Drivers of Health   Tobacco Use: Medium Risk (12/22/2024)   Patient History    Smoking Tobacco Use: Former    Smokeless Tobacco Use: Unknown    Passive Exposure: Not on file  Financial Resource Strain: Low Risk (02/13/2023)   Overall Financial Resource Strain (CARDIA)    Difficulty of Paying Living Expenses: Not hard at all  Food Insecurity: No Food Insecurity (05/13/2024)   Hunger Vital Sign    Worried About Running Out of Food in the Last Year: Never true    Ran Out of Food in the Last Year: Never true  Transportation Needs: No Transportation Needs (05/13/2024)   PRAPARE - Administrator, Civil Service (Medical): No    Lack of Transportation (Non-Medical): No  Physical Activity: Not on file  Stress: No Stress Concern Present (02/13/2023)   [employer] of Occupational Health - Occupational Stress Questionnaire    Feeling of Stress : Not at all  Social Connections: Not on file  Intimate Partner Violence: Not At Risk (05/13/2024)   Humiliation, Afraid, Rape, and Kick questionnaire    Fear of Current or Ex-Partner: No    Emotionally Abused: No    Physically Abused: No    Sexually Abused: No  Depression (PHQ2-9): Low Risk (05/13/2024)   Depression (PHQ2-9)    PHQ-2 Score: 0  Alcohol Screen: Low Risk (05/13/2024)   Alcohol Screen    Last Alcohol Screening Score (AUDIT):  1  Housing: Low Risk (05/13/2024)   Housing Stability Vital Sign    Unable to Pay for Housing in the Last Year: No    Number of Times Moved in the Last Year: 0    Homeless in the Last Year: No  Utilities: Not At Risk (05/13/2024)   [facility] Utilities    Threatened with loss of utilities: No  Health Literacy: Not on file    FAMILY HISTORY: Family History  Problem Relation Age of Onset   Diabetes Mother    Hypertension Mother    Hypertension Father    Stroke Father    Diabetes Father    Cancer Paternal Grandmother  breat cancer    ALLERGIES:  has no known allergies.  MEDICATIONS:  Current Outpatient Medications  Medication Sig Dispense Refill   betamethasone  valerate ointment (VALISONE ) 0.1 % Apply 1 Application topically 2 (two) times daily. 30 g 0   valACYclovir  (VALTREX ) 500 MG tablet Take 1 tablet (500 mg total) by mouth daily. 90 tablet 1   No current facility-administered medications for this visit.    Review of Systems  Constitutional:  Negative for appetite change, chills, fatigue, fever and unexpected weight change.  HENT:   Negative for hearing loss and voice change.   Eyes:  Negative for eye problems and icterus.  Respiratory:  Negative for chest tightness, cough and shortness of breath.   Cardiovascular:  Negative for chest pain and leg swelling.  Gastrointestinal:  Negative for abdominal distention and abdominal pain.  Endocrine: Negative for hot flashes.  Genitourinary:  Negative for difficulty urinating, dysuria and frequency.   Musculoskeletal:  Negative for arthralgias.  Skin:  Negative for itching and rash.  Neurological:  Negative for light-headedness and numbness.  Hematological:  Negative for adenopathy. Does not bruise/bleed easily.  Psychiatric/Behavioral:  Negative for confusion.       PHYSICAL EXAMINATION: ECOG PERFORMANCE STATUS: 0 - Asymptomatic  Vitals:   12/22/24 1330 12/22/24 1336  BP: (!) 149/105 (!) 149/89  Pulse: 88   Resp: 18    Temp: 98.1 F (36.7 C)    Filed Weights   12/22/24 1330  Weight: 296 lb (134.3 kg)    Physical Exam Constitutional:      General: He is not in acute distress. HENT:     Head: Normocephalic and atraumatic.  Eyes:     General: No scleral icterus. Cardiovascular:     Rate and Rhythm: Normal rate and regular rhythm.     Heart sounds: No murmur heard. Pulmonary:     Effort: Pulmonary effort is normal. No respiratory distress.  Abdominal:     General: There is no distension.     Palpations: Abdomen is soft.  Genitourinary:    Comments: No palpable inguinal massive lymphadenopathy  Musculoskeletal:        General: Normal range of motion.     Cervical back: Normal range of motion and neck supple.  Skin:    General: Skin is warm and dry.     Findings: No erythema.  Neurological:     Mental Status: He is alert and oriented to person, place, and time. Mental status is at baseline.     Cranial Nerves: No cranial nerve deficit.     Motor: No abnormal muscle tone.  Psychiatric:        Mood and Affect: Mood and affect normal.    Randy [name] presents for chaperon.  Randy [name] served as chaperone during GU examination LABORATORY DATA:  I have reviewed the data as listed    Latest Ref Rng & Units 12/22/2024    1:14 PM 04/11/2024   10:17 AM 06/20/2023   10:22 AM  CBC  WBC 4.0 - 10.5 K/uL 7.0  6.8  11.3   Hemoglobin 13.0 - 17.0 g/dL 85.2  84.3  86.6   Hematocrit 39.0 - 52.0 % 43.9  47.0  39.1   Platelets 150 - 400 K/uL 292  220  418       Latest Ref Rng & Units 12/22/2024    1:14 PM 05/12/2024   10:44 AM 04/11/2024   10:17 AM  CMP  Glucose 70 - 99 mg/dL 889  113   BUN 6 - 20 mg/dL 13   15   Creatinine 9.38 - 1.24 mg/dL 8.92   8.90   Sodium 864 - 145 mmol/L 142   138   Potassium 3.5 - 5.1 mmol/L 3.9   3.8   Chloride 98 - 111 mmol/L 106   105   CO2 22 - 32 mmol/L 26   25   Calcium 8.9 - 10.3 mg/dL 9.4   9.2   Total Protein 6.5 - 8.1 g/dL 7.1  7.3  7.1   Total  Bilirubin 0.0 - 1.2 mg/dL 0.4  0.4  0.8   Alkaline Phos 38 - 126 U/L 108  93  89   AST 15 - 41 U/L 50  37  46   ALT 0 - 44 U/L 56  38  50      RADIOGRAPHIC STUDIES: I have personally reviewed the radiological images as listed and agreed with the findings in the report. No results found.   "

## 2024-12-22 NOTE — Assessment & Plan Note (Addendum)
 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  Total inpatient had 4 cycles of BV-CHP with D3 GCSF 05/01/2024 PET3 showed CR, Deuville 2  Patient lost follow-up after 4 cycles chemotherapy treatment. 04/18/2024 PET showed CR, slight increase of uptake of left inguinal LN. Deuville 3 Patient was evaluated by radiation oncology.  Patient elects not to proceed with radiation. 11/07/2024, PET scan showed CR.  Decreased activity of the left inguinal lymph node.  Discussed with patient that he has been in complete remission.  Clinically doing very well.  In the future, images for PE obtained as clinically indicated. LDH is chronically elevated.  Unknown significance.

## 2024-12-22 NOTE — Assessment & Plan Note (Signed)
 Stable.  Monitor. Likely due to fatty liver disease.  Discussed with patient about health diet and exercise.

## 2024-12-23 ENCOUNTER — Other Ambulatory Visit: Payer: Self-pay

## 2025-01-01 ENCOUNTER — Inpatient Hospital Stay: Admitting: Oncology

## 2025-01-01 ENCOUNTER — Inpatient Hospital Stay

## 2025-02-16 ENCOUNTER — Inpatient Hospital Stay

## 2025-04-13 ENCOUNTER — Inpatient Hospital Stay

## 2025-06-22 ENCOUNTER — Inpatient Hospital Stay: Admitting: Oncology

## 2025-06-22 ENCOUNTER — Inpatient Hospital Stay
